# Patient Record
Sex: Male | Born: 1950
Health system: Southern US, Community
[De-identification: ages and names within clinical notes are randomized; demographics above are authoritative.]

## PROBLEM LIST (undated history)

## (undated) DIAGNOSIS — D649 Anemia, unspecified: Secondary | ICD-10-CM

## (undated) DIAGNOSIS — D61818 Other pancytopenia: Secondary | ICD-10-CM

## (undated) DIAGNOSIS — G9340 Encephalopathy, unspecified: Secondary | ICD-10-CM

## (undated) DIAGNOSIS — A419 Sepsis, unspecified organism: Secondary | ICD-10-CM

## (undated) DIAGNOSIS — N390 Urinary tract infection, site not specified: Secondary | ICD-10-CM

## (undated) DIAGNOSIS — Z8619 Personal history of other infectious and parasitic diseases: Secondary | ICD-10-CM

## (undated) DIAGNOSIS — I82401 Acute embolism and thrombosis of unspecified deep veins of right lower extremity: Secondary | ICD-10-CM

## (undated) DIAGNOSIS — R1310 Dysphagia, unspecified: Secondary | ICD-10-CM

## (undated) DIAGNOSIS — I82409 Acute embolism and thrombosis of unspecified deep veins of unspecified lower extremity: Secondary | ICD-10-CM

## (undated) DIAGNOSIS — N289 Disorder of kidney and ureter, unspecified: Secondary | ICD-10-CM

## (undated) DIAGNOSIS — D759 Disease of blood and blood-forming organs, unspecified: Secondary | ICD-10-CM

## (undated) DIAGNOSIS — N400 Enlarged prostate without lower urinary tract symptoms: Secondary | ICD-10-CM

## (undated) DIAGNOSIS — D531 Other megaloblastic anemias, not elsewhere classified: Secondary | ICD-10-CM

## (undated) DIAGNOSIS — I10 Essential (primary) hypertension: Secondary | ICD-10-CM

## (undated) DIAGNOSIS — R627 Adult failure to thrive: Secondary | ICD-10-CM

## (undated) DIAGNOSIS — Z95828 Presence of other vascular implants and grafts: Secondary | ICD-10-CM

## (undated) DIAGNOSIS — G709 Myoneural disorder, unspecified: Secondary | ICD-10-CM

## (undated) DIAGNOSIS — I739 Peripheral vascular disease, unspecified: Secondary | ICD-10-CM

## (undated) DIAGNOSIS — N210 Calculus in bladder: Secondary | ICD-10-CM

## (undated) HISTORY — DX: Acute embolism and thrombosis of unspecified deep veins of unspecified lower extremity: I82.409

---

## 2014-08-13 ENCOUNTER — Emergency Department (HOSPITAL_COMMUNITY): Payer: Medicare Other

## 2014-08-13 ENCOUNTER — Encounter (HOSPITAL_COMMUNITY): Payer: Self-pay | Admitting: Emergency Medicine

## 2014-08-13 ENCOUNTER — Inpatient Hospital Stay (HOSPITAL_COMMUNITY)
Admission: EM | Admit: 2014-08-13 | Discharge: 2014-08-22 | DRG: 673 | Disposition: A | Discharge status: Skilled Nursing Facility | Payer: Medicare Other | Attending: Family Medicine | Admitting: Family Medicine

## 2014-08-13 DIAGNOSIS — Q646 Congenital diverticulum of bladder: Secondary | ICD-10-CM | POA: Diagnosis not present

## 2014-08-13 DIAGNOSIS — M6281 Muscle weakness (generalized): Secondary | ICD-10-CM | POA: Diagnosis not present

## 2014-08-13 DIAGNOSIS — Z720 Tobacco use: Secondary | ICD-10-CM

## 2014-08-13 DIAGNOSIS — E538 Deficiency of other specified B group vitamins: Secondary | ICD-10-CM

## 2014-08-13 DIAGNOSIS — E519 Thiamine deficiency, unspecified: Secondary | ICD-10-CM | POA: Diagnosis present

## 2014-08-13 DIAGNOSIS — D539 Nutritional anemia, unspecified: Secondary | ICD-10-CM | POA: Diagnosis not present

## 2014-08-13 DIAGNOSIS — N320 Bladder-neck obstruction: Secondary | ICD-10-CM | POA: Diagnosis present

## 2014-08-13 DIAGNOSIS — R310 Gross hematuria: Secondary | ICD-10-CM | POA: Diagnosis not present

## 2014-08-13 DIAGNOSIS — D531 Other megaloblastic anemias, not elsewhere classified: Secondary | ICD-10-CM | POA: Diagnosis not present

## 2014-08-13 DIAGNOSIS — D61818 Other pancytopenia: Secondary | ICD-10-CM | POA: Diagnosis not present

## 2014-08-13 DIAGNOSIS — G9389 Other specified disorders of brain: Secondary | ICD-10-CM | POA: Diagnosis not present

## 2014-08-13 DIAGNOSIS — N323 Diverticulum of bladder: Secondary | ICD-10-CM | POA: Diagnosis not present

## 2014-08-13 DIAGNOSIS — I82401 Acute embolism and thrombosis of unspecified deep veins of right lower extremity: Secondary | ICD-10-CM | POA: Diagnosis not present

## 2014-08-13 DIAGNOSIS — E46 Unspecified protein-calorie malnutrition: Secondary | ICD-10-CM | POA: Diagnosis not present

## 2014-08-13 DIAGNOSIS — I82409 Acute embolism and thrombosis of unspecified deep veins of unspecified lower extremity: Secondary | ICD-10-CM

## 2014-08-13 DIAGNOSIS — E440 Moderate protein-calorie malnutrition: Secondary | ICD-10-CM | POA: Diagnosis not present

## 2014-08-13 DIAGNOSIS — Z8042 Family history of malignant neoplasm of prostate: Secondary | ICD-10-CM | POA: Diagnosis not present

## 2014-08-13 DIAGNOSIS — R410 Disorientation, unspecified: Secondary | ICD-10-CM | POA: Diagnosis not present

## 2014-08-13 DIAGNOSIS — Z6824 Body mass index (BMI) 24.0-24.9, adult: Secondary | ICD-10-CM

## 2014-08-13 DIAGNOSIS — N210 Calculus in bladder: Secondary | ICD-10-CM | POA: Diagnosis present

## 2014-08-13 DIAGNOSIS — E876 Hypokalemia: Secondary | ICD-10-CM | POA: Diagnosis present

## 2014-08-13 DIAGNOSIS — E860 Dehydration: Secondary | ICD-10-CM | POA: Diagnosis present

## 2014-08-13 DIAGNOSIS — G9340 Encephalopathy, unspecified: Secondary | ICD-10-CM

## 2014-08-13 DIAGNOSIS — R32 Unspecified urinary incontinence: Secondary | ICD-10-CM | POA: Diagnosis present

## 2014-08-13 DIAGNOSIS — R4182 Altered mental status, unspecified: Secondary | ICD-10-CM | POA: Diagnosis not present

## 2014-08-13 DIAGNOSIS — I8290 Acute embolism and thrombosis of unspecified vein: Secondary | ICD-10-CM | POA: Diagnosis not present

## 2014-08-13 DIAGNOSIS — R319 Hematuria, unspecified: Secondary | ICD-10-CM

## 2014-08-13 DIAGNOSIS — G9349 Other encephalopathy: Secondary | ICD-10-CM | POA: Diagnosis not present

## 2014-08-13 DIAGNOSIS — J90 Pleural effusion, not elsewhere classified: Secondary | ICD-10-CM | POA: Diagnosis not present

## 2014-08-13 DIAGNOSIS — R1084 Generalized abdominal pain: Secondary | ICD-10-CM

## 2014-08-13 DIAGNOSIS — I82411 Acute embolism and thrombosis of right femoral vein: Secondary | ICD-10-CM | POA: Diagnosis not present

## 2014-08-13 DIAGNOSIS — Z049 Encounter for examination and observation for unspecified reason: Secondary | ICD-10-CM | POA: Diagnosis not present

## 2014-08-13 DIAGNOSIS — N390 Urinary tract infection, site not specified: Secondary | ICD-10-CM | POA: Diagnosis not present

## 2014-08-13 DIAGNOSIS — R627 Adult failure to thrive: Secondary | ICD-10-CM | POA: Diagnosis not present

## 2014-08-13 DIAGNOSIS — E43 Unspecified severe protein-calorie malnutrition: Secondary | ICD-10-CM | POA: Diagnosis present

## 2014-08-13 DIAGNOSIS — R531 Weakness: Secondary | ICD-10-CM | POA: Diagnosis not present

## 2014-08-13 DIAGNOSIS — E559 Vitamin D deficiency, unspecified: Secondary | ICD-10-CM

## 2014-08-13 DIAGNOSIS — R41841 Cognitive communication deficit: Secondary | ICD-10-CM | POA: Diagnosis not present

## 2014-08-13 DIAGNOSIS — R079 Chest pain, unspecified: Secondary | ICD-10-CM | POA: Diagnosis not present

## 2014-08-13 DIAGNOSIS — R279 Unspecified lack of coordination: Secondary | ICD-10-CM | POA: Diagnosis not present

## 2014-08-13 LAB — URINE MICROSCOPIC-ADD ON

## 2014-08-13 LAB — TROPONIN I: Troponin I: <0.3 ng/mL (ref <0.30)

## 2014-08-13 LAB — URINALYSIS, ROUTINE W REFLEX MICROSCOPIC
GLUCOSE, UA: NEGATIVE mg/dL
KETONES UR: 40 mg/dL — AB
Nitrite: NEGATIVE
PH: 8.5 — AB (ref 5.0–8.0)
Protein, ur: >300 mg/dL — AB
SPECIFIC GRAVITY, URINE: 1.02 (ref 1.005–1.030)
Urobilinogen, UA: 1 mg/dL (ref 0.0–1.0)

## 2014-08-13 LAB — CBC WITH DIFFERENTIAL/PLATELET
Basophils Absolute: 0 10*3/uL (ref 0.0–0.1)
Basophils Relative: 0 % (ref 0–1)
EOS ABS: 0 10*3/uL (ref 0.0–0.7)
EOS PCT: 1 % (ref 0–5)
HCT: 25.6 % — ABNORMAL LOW (ref 39.0–52.0)
HEMOGLOBIN: 9 g/dL — AB (ref 13.0–17.0)
Lymphocytes Relative: 36 % (ref 12–46)
Lymphs Abs: 1.5 10*3/uL (ref 0.7–4.0)
MCH: 40.4 pg — AB (ref 26.0–34.0)
MCHC: 35.2 g/dL (ref 30.0–36.0)
MCV: 114.8 fL — ABNORMAL HIGH (ref 78.0–100.0)
MONO ABS: 0 10*3/uL — AB (ref 0.1–1.0)
Monocytes Relative: 1 % — ABNORMAL LOW (ref 3–12)
NEUTROS ABS: 2.7 10*3/uL (ref 1.7–7.7)
NEUTROS PCT: 62 % (ref 43–77)
Platelets: 137 10*3/uL — ABNORMAL LOW (ref 150–400)
RBC: 2.23 MIL/uL — AB (ref 4.22–5.81)
RDW: 19.4 % — ABNORMAL HIGH (ref 11.5–15.5)
WBC: 4.2 10*3/uL (ref 4.0–10.5)

## 2014-08-13 LAB — COMPREHENSIVE METABOLIC PANEL
ALT: 7 U/L (ref 0–53)
AST: 14 U/L (ref 0–37)
Albumin: 3.7 g/dL (ref 3.5–5.2)
Alkaline Phosphatase: 49 U/L (ref 39–117)
Anion gap: 15 (ref 5–15)
BUN: 32 mg/dL — ABNORMAL HIGH (ref 6–23)
CHLORIDE: 100 meq/L (ref 96–112)
CO2: 25 meq/L (ref 19–32)
Calcium: 9.4 mg/dL (ref 8.4–10.5)
Creatinine, Ser: 0.92 mg/dL (ref 0.50–1.35)
GFR, EST NON AFRICAN AMERICAN: 87 mL/min — AB (ref >90)
GLUCOSE: 83 mg/dL (ref 70–99)
Potassium: 4 mEq/L (ref 3.7–5.3)
SODIUM: 140 meq/L (ref 137–147)
Total Bilirubin: 2.2 mg/dL — ABNORMAL HIGH (ref 0.3–1.2)
Total Protein: 7.1 g/dL (ref 6.0–8.3)

## 2014-08-13 LAB — PRO B NATRIURETIC PEPTIDE: Pro B Natriuretic peptide (BNP): 82.8 pg/mL (ref 0–125)

## 2014-08-13 MED ORDER — DEXTROSE 5 % IV SOLN
1.0000 g | Freq: Once | INTRAVENOUS | Status: AC
  Administered 2014-08-13: 1 g via INTRAVENOUS
  Filled 2014-08-13: qty 10

## 2014-08-13 MED ORDER — SODIUM CHLORIDE 0.9 % IV BOLUS (SEPSIS): 1000.0000 mL | Freq: Once | INTRAVENOUS | Status: AC

## 2014-08-13 NOTE — ED Notes (Signed)
Brought in by [facility], Son called ems for chest pain, but patient has not c/o any pain at all. Pt found in urine and feces walking around, with possible dementia like actions per EMS.

## 2014-08-13 NOTE — ED Notes (Signed)
Patient given bath. Patient soiled from urine and stool. Clothes placed in patient belonging bags. Family at bedside at this time.

## 2014-08-13 NOTE — ED Provider Notes (Signed)
CSN: [id number]     Arrival date & time 08/13/14  2026 History  This chart was scribed for Geoffery Lyonsouglas Lorayne Getchell, MD by Tonye RoyaltyJoshua Chen, ED Scribe. This patient was seen in room APA05/APA05 and the patient's care was started at 8:48 PM.    Chief Complaint  Patient presents with  . Failure To Thrive   The history is provided by the patient and the EMS personnel. No language interpreter was used.   HPI Comments: Edwin [name] is a 63 y.o. male who presents to the Emergency Department for "nerves." He states he lives at home by himself, though his son visits him approximately once a week. He denies any pain. He denies abdominal pain or diarrhea. He reports incontinence. Per EMS, he called them complaining of chest pain. Upon arrival, he denied any pain and was walking around in clothes soiled with urine and feces with dementia-like actions. They state his son wanted him to come to the ED and be evaluated.  History reviewed. No pertinent past medical history. History reviewed. No pertinent past surgical history. No family history on file. History  Substance Use Topics  . Smoking status: Not on file  . Smokeless tobacco: Current User  . Alcohol Use: No    Review of Systems  All other systems reviewed and are negative. A complete 10 system review of systems was obtained and all systems are negative except as noted in the HPI and PMH.    Allergies  Review of patient's allergies indicates no known allergies.  Home Medications   Prior to Admission medications   Not on File   There were no vitals taken for this visit. Physical Exam  Nursing note and vitals reviewed. Constitutional: He is oriented to person, place, and time. No distress.  Patient is a 63 year old male who appears disheveled and unkempt.   HENT:  Head: Normocephalic and atraumatic.  Eyes: Conjunctivae and EOM are normal.  Neck: Normal range of motion. Neck supple.  Cardiovascular: Normal rate, regular rhythm and normal heart  sounds.   No murmur heard. Pulmonary/Chest: Effort normal and breath sounds normal. No respiratory distress. He has no wheezes. He has no rales.  Abdominal: Soft. Bowel sounds are normal. He exhibits no distension. There is no tenderness. There is no rebound and no guarding.  Musculoskeletal: Normal range of motion.  Neurological: He is alert and oriented to person, place, and time.  Skin: Skin is warm and dry.  There are excoriations in the groin from residual stool and urine.  Psychiatric: He has a normal mood and affect.    ED Course  Procedures (including critical care time) Labs Review Labs Reviewed - No data to display  Imaging Review No results found.   EKG Interpretation   Date/Time:  Monday August 13 2014 21:23:24 EDT Ventricular Rate:  91 PR Interval:  161 QRS Duration: 113 QT Interval:  379 QTC Calculation: 466 R Axis:   23 Text Interpretation:  Sinus rhythm Consider left atrial enlargement  Borderline intraventricular conduction delay Borderline T abnormalities,  lateral leads No prior ecg for comparison Confirmed by DELOS  MD, Bridgette Wolden  ([id number]) on 08/13/2014 11:22:33 PM     DIAGNOSTIC STUDIES: Oxygen Saturation is 99% on room air, normal by my interpretation.    COORDINATION OF CARE: 8:57 PM Discussed treatment plan with patient at beside, the patient agrees with the plan and has no further questions at this time.   MDM   Final diagnoses:  None    Patient  brought for evaluation of progressive weakness and confusion over the past 2 months, worse over the past week. When the son went to check on him today, he was found to be extremely disheveled, covered in his own urine and feces. 911 was called and the patient was brought here. He denies to me he is having any discomfort. He is somewhat difficult to comprehend, however responds appropriately to questions and commands.  Workup reveals essentially unremarkable laboratory studies, negative head CT, and  chest x-ray. He does appear to have a urinary tract infection which will be treated with Rocephin. I have contacted Dr. Alvester MorinNewton from the hospitalist service who will admit the patient.  I personally performed the services described in this documentation, which was scribed in my presence. The recorded information has been reviewed and is accurate.     Geoffery Lyonsouglas Joscelyne Renville, MD 08/16/14 [phone]

## 2014-08-13 NOTE — ED Notes (Addendum)
Pt admitted via EMS from home in clothes saturated with urine and feces. Removed pants and under garments to find patient covered in old dried feces and urine as well as what appeared to be more recent saturation. Groin and scrotum excoriated with open areas, no bleeding noted. Pt stated he had not changed his clothes because "they had not given him time, and he was going to take a bath today sometime." Patient pants and all belongings placed in belongings bag.

## 2014-08-14 ENCOUNTER — Encounter (HOSPITAL_COMMUNITY): Payer: Self-pay | Admitting: *Deleted

## 2014-08-14 ENCOUNTER — Observation Stay (HOSPITAL_COMMUNITY): Payer: Medicare Other

## 2014-08-14 DIAGNOSIS — R079 Chest pain, unspecified: Secondary | ICD-10-CM | POA: Diagnosis not present

## 2014-08-14 DIAGNOSIS — N320 Bladder-neck obstruction: Secondary | ICD-10-CM | POA: Diagnosis present

## 2014-08-14 DIAGNOSIS — E519 Thiamine deficiency, unspecified: Secondary | ICD-10-CM | POA: Diagnosis present

## 2014-08-14 DIAGNOSIS — N323 Diverticulum of bladder: Secondary | ICD-10-CM | POA: Diagnosis not present

## 2014-08-14 DIAGNOSIS — N210 Calculus in bladder: Secondary | ICD-10-CM | POA: Diagnosis not present

## 2014-08-14 DIAGNOSIS — E43 Unspecified severe protein-calorie malnutrition: Secondary | ICD-10-CM | POA: Diagnosis present

## 2014-08-14 DIAGNOSIS — G9349 Other encephalopathy: Secondary | ICD-10-CM | POA: Diagnosis not present

## 2014-08-14 DIAGNOSIS — E538 Deficiency of other specified B group vitamins: Secondary | ICD-10-CM | POA: Diagnosis present

## 2014-08-14 DIAGNOSIS — I82409 Acute embolism and thrombosis of unspecified deep veins of unspecified lower extremity: Secondary | ICD-10-CM | POA: Diagnosis not present

## 2014-08-14 DIAGNOSIS — I82411 Acute embolism and thrombosis of right femoral vein: Secondary | ICD-10-CM | POA: Diagnosis not present

## 2014-08-14 DIAGNOSIS — R410 Disorientation, unspecified: Secondary | ICD-10-CM | POA: Diagnosis not present

## 2014-08-14 DIAGNOSIS — Z8042 Family history of malignant neoplasm of prostate: Secondary | ICD-10-CM | POA: Diagnosis not present

## 2014-08-14 DIAGNOSIS — D61818 Other pancytopenia: Secondary | ICD-10-CM | POA: Diagnosis present

## 2014-08-14 DIAGNOSIS — E440 Moderate protein-calorie malnutrition: Secondary | ICD-10-CM | POA: Diagnosis not present

## 2014-08-14 DIAGNOSIS — G9389 Other specified disorders of brain: Secondary | ICD-10-CM | POA: Diagnosis not present

## 2014-08-14 DIAGNOSIS — R4182 Altered mental status, unspecified: Secondary | ICD-10-CM | POA: Diagnosis not present

## 2014-08-14 DIAGNOSIS — E876 Hypokalemia: Secondary | ICD-10-CM | POA: Diagnosis present

## 2014-08-14 DIAGNOSIS — G9340 Encephalopathy, unspecified: Secondary | ICD-10-CM | POA: Diagnosis not present

## 2014-08-14 DIAGNOSIS — R279 Unspecified lack of coordination: Secondary | ICD-10-CM | POA: Diagnosis not present

## 2014-08-14 DIAGNOSIS — R627 Adult failure to thrive: Secondary | ICD-10-CM | POA: Diagnosis not present

## 2014-08-14 DIAGNOSIS — R32 Unspecified urinary incontinence: Secondary | ICD-10-CM | POA: Diagnosis present

## 2014-08-14 DIAGNOSIS — E46 Unspecified protein-calorie malnutrition: Secondary | ICD-10-CM | POA: Diagnosis not present

## 2014-08-14 DIAGNOSIS — E559 Vitamin D deficiency, unspecified: Secondary | ICD-10-CM | POA: Diagnosis present

## 2014-08-14 DIAGNOSIS — Q646 Congenital diverticulum of bladder: Secondary | ICD-10-CM | POA: Diagnosis not present

## 2014-08-14 DIAGNOSIS — N390 Urinary tract infection, site not specified: Secondary | ICD-10-CM | POA: Diagnosis not present

## 2014-08-14 DIAGNOSIS — E860 Dehydration: Secondary | ICD-10-CM | POA: Diagnosis present

## 2014-08-14 DIAGNOSIS — R41841 Cognitive communication deficit: Secondary | ICD-10-CM | POA: Diagnosis not present

## 2014-08-14 DIAGNOSIS — M6281 Muscle weakness (generalized): Secondary | ICD-10-CM | POA: Diagnosis not present

## 2014-08-14 DIAGNOSIS — I82401 Acute embolism and thrombosis of unspecified deep veins of right lower extremity: Secondary | ICD-10-CM

## 2014-08-14 DIAGNOSIS — D539 Nutritional anemia, unspecified: Secondary | ICD-10-CM | POA: Diagnosis not present

## 2014-08-14 DIAGNOSIS — R310 Gross hematuria: Secondary | ICD-10-CM | POA: Diagnosis not present

## 2014-08-14 DIAGNOSIS — I8290 Acute embolism and thrombosis of unspecified vein: Secondary | ICD-10-CM | POA: Diagnosis not present

## 2014-08-14 DIAGNOSIS — J90 Pleural effusion, not elsewhere classified: Secondary | ICD-10-CM | POA: Diagnosis not present

## 2014-08-14 DIAGNOSIS — R319 Hematuria, unspecified: Secondary | ICD-10-CM | POA: Diagnosis not present

## 2014-08-14 DIAGNOSIS — D531 Other megaloblastic anemias, not elsewhere classified: Secondary | ICD-10-CM | POA: Diagnosis not present

## 2014-08-14 DIAGNOSIS — Z6824 Body mass index (BMI) 24.0-24.9, adult: Secondary | ICD-10-CM | POA: Diagnosis not present

## 2014-08-14 DIAGNOSIS — Z720 Tobacco use: Secondary | ICD-10-CM | POA: Diagnosis not present

## 2014-08-14 DIAGNOSIS — R531 Weakness: Secondary | ICD-10-CM | POA: Diagnosis not present

## 2014-08-14 HISTORY — DX: Other pancytopenia: D61.818

## 2014-08-14 HISTORY — DX: Encephalopathy, unspecified: G93.40

## 2014-08-14 HISTORY — DX: Acute embolism and thrombosis of unspecified deep veins of right lower extremity: I82.401

## 2014-08-14 LAB — HEMOGLOBIN A1C
Hgb A1c MFr Bld: 5.6 % (ref <5.7)
MEAN PLASMA GLUCOSE: 114 mg/dL (ref <117)

## 2014-08-14 LAB — CBC WITH DIFFERENTIAL/PLATELET
BASOS PCT: 0 % (ref 0–1)
Basophils Absolute: 0 10*3/uL (ref 0.0–0.1)
Eosinophils Absolute: 0.1 10*3/uL (ref 0.0–0.7)
Eosinophils Relative: 2 % (ref 0–5)
HCT: 22.5 % — ABNORMAL LOW (ref 39.0–52.0)
Hemoglobin: 7.7 g/dL — ABNORMAL LOW (ref 13.0–17.0)
Lymphocytes Relative: 49 % — ABNORMAL HIGH (ref 12–46)
Lymphs Abs: 1.5 10*3/uL (ref 0.7–4.0)
MCH: 39.5 pg — AB (ref 26.0–34.0)
MCHC: 34.2 g/dL (ref 30.0–36.0)
MCV: 115.4 fL — ABNORMAL HIGH (ref 78.0–100.0)
Monocytes Absolute: 0.1 10*3/uL (ref 0.1–1.0)
Monocytes Relative: 2 % — ABNORMAL LOW (ref 3–12)
NEUTROS PCT: 47 % (ref 43–77)
Neutro Abs: 1.4 10*3/uL — ABNORMAL LOW (ref 1.7–7.7)
PLATELETS: 111 10*3/uL — AB (ref 150–400)
RBC: 1.95 MIL/uL — AB (ref 4.22–5.81)
RDW: 19.3 % — ABNORMAL HIGH (ref 11.5–15.5)
WBC: 3.1 10*3/uL — ABNORMAL LOW (ref 4.0–10.5)

## 2014-08-14 LAB — COMPREHENSIVE METABOLIC PANEL
ALK PHOS: 42 U/L (ref 39–117)
ALT: 6 U/L (ref 0–53)
AST: 13 U/L (ref 0–37)
Albumin: 3.1 g/dL — ABNORMAL LOW (ref 3.5–5.2)
Anion gap: 11 (ref 5–15)
BUN: 29 mg/dL — AB (ref 6–23)
CALCIUM: 8.4 mg/dL (ref 8.4–10.5)
CO2: 26 mEq/L (ref 19–32)
Chloride: 103 mEq/L (ref 96–112)
Creatinine, Ser: 0.86 mg/dL (ref 0.50–1.35)
GFR, EST NON AFRICAN AMERICAN: 89 mL/min — AB (ref >90)
Glucose, Bld: 80 mg/dL (ref 70–99)
Potassium: 4.1 mEq/L (ref 3.7–5.3)
SODIUM: 140 meq/L (ref 137–147)
Total Bilirubin: 1.7 mg/dL — ABNORMAL HIGH (ref 0.3–1.2)
Total Protein: 6 g/dL (ref 6.0–8.3)

## 2014-08-14 LAB — LIPID PANEL
Cholesterol: 111 mg/dL (ref 0–200)
HDL: 18 mg/dL — AB (ref >39)
LDL CALC: 72 mg/dL (ref 0–99)
TRIGLYCERIDES: 104 mg/dL (ref <150)
Total CHOL/HDL Ratio: 6.2 RATIO
VLDL: 21 mg/dL (ref 0–40)

## 2014-08-14 LAB — IRON AND TIBC
IRON: 50 ug/dL (ref 42–135)
Saturation Ratios: 27 % (ref 20–55)
TIBC: 187 ug/dL — ABNORMAL LOW (ref 215–435)
UIBC: 137 ug/dL (ref 125–400)

## 2014-08-14 LAB — FERRITIN: FERRITIN: 254 ng/mL (ref 22–322)

## 2014-08-14 LAB — TSH: TSH: 0.802 u[IU]/mL (ref 0.350–4.500)

## 2014-08-14 LAB — RAPID URINE DRUG SCREEN, HOSP PERFORMED
AMPHETAMINES: NOT DETECTED
Barbiturates: NOT DETECTED
Benzodiazepines: NOT DETECTED
COCAINE: NOT DETECTED
OPIATES: NOT DETECTED
Tetrahydrocannabinol: NOT DETECTED

## 2014-08-14 LAB — TROPONIN I
Troponin I: <0.3 ng/mL (ref <0.30)
Troponin I: <0.3 ng/mL (ref <0.30)

## 2014-08-14 LAB — RETICULOCYTES
RBC.: 2.02 MIL/uL — ABNORMAL LOW (ref 4.22–5.81)
Retic Count, Absolute: 12.1 10*3/uL — ABNORMAL LOW (ref 19.0–186.0)
Retic Ct Pct: 0.6 % (ref 0.4–3.1)

## 2014-08-14 LAB — CBC
HEMATOCRIT: 25.7 % — AB (ref 39.0–52.0)
HEMOGLOBIN: 9 g/dL — AB (ref 13.0–17.0)
MCH: 40.7 pg — ABNORMAL HIGH (ref 26.0–34.0)
MCHC: 35 g/dL (ref 30.0–36.0)
MCV: 116.3 fL — ABNORMAL HIGH (ref 78.0–100.0)
Platelets: 123 10*3/uL — ABNORMAL LOW (ref 150–400)
RBC: 2.21 MIL/uL — ABNORMAL LOW (ref 4.22–5.81)
RDW: 19.2 % — ABNORMAL HIGH (ref 11.5–15.5)
WBC: 3.4 10*3/uL — ABNORMAL LOW (ref 4.0–10.5)

## 2014-08-14 LAB — FOLATE: Folate: >20 ng/mL

## 2014-08-14 LAB — VITAMIN B12: Vitamin B-12: 88 pg/mL — ABNORMAL LOW (ref 211–911)

## 2014-08-14 LAB — AMMONIA: Ammonia: 21 umol/L (ref 11–60)

## 2014-08-14 LAB — PROTIME-INR
INR: 1.22 (ref 0.00–1.49)
PROTHROMBIN TIME: 15.5 s — AB (ref 11.6–15.2)

## 2014-08-14 LAB — PREALBUMIN: Prealbumin: 7 mg/dL — ABNORMAL LOW (ref 17.0–34.0)

## 2014-08-14 MED ORDER — SODIUM CHLORIDE 0.9 % IV SOLN
INTRAVENOUS | Status: DC
  Administered 2014-08-14 – 2014-08-22 (×11): via INTRAVENOUS

## 2014-08-14 MED ORDER — VANCOMYCIN HCL IN DEXTROSE 750-5 MG/150ML-% IV SOLN
750.0000 mg | Freq: Once | INTRAVENOUS | Status: AC
Start: 2014-08-14 — End: 2014-08-14
  Administered 2014-08-14: 750 mg via INTRAVENOUS
  Filled 2014-08-14: qty 150

## 2014-08-14 MED ORDER — PANTOPRAZOLE SODIUM 40 MG PO TBEC
40.0000 mg | DELAYED_RELEASE_TABLET | Freq: Every day | ORAL | Status: DC
  Administered 2014-08-14 – 2014-08-22 (×9): 40 mg via ORAL
  Filled 2014-08-14 (×10): qty 1

## 2014-08-14 MED ORDER — VANCOMYCIN HCL IN DEXTROSE 750-5 MG/150ML-% IV SOLN
INTRAVENOUS | Status: AC
  Filled 2014-08-14: qty 150

## 2014-08-14 MED ORDER — PIPERACILLIN-TAZOBACTAM 3.375 G IVPB
INTRAVENOUS | Status: AC
  Filled 2014-08-14: qty 50

## 2014-08-14 MED ORDER — ENOXAPARIN SODIUM 80 MG/0.8ML ~~LOC~~ SOLN
1.0000 mg/kg | Freq: Two times a day (BID) | SUBCUTANEOUS | Status: DC
  Administered 2014-08-14 – 2014-08-15 (×2): 70 mg via SUBCUTANEOUS
  Filled 2014-08-14 (×2): qty 0.8

## 2014-08-14 MED ORDER — THIAMINE HCL 100 MG/ML IJ SOLN
100.0000 mg | Freq: Every day | INTRAMUSCULAR | Status: DC
  Administered 2014-08-14 – 2014-08-21 (×8): 100 mg via INTRAVENOUS
  Filled 2014-08-14 (×9): qty 2

## 2014-08-14 MED ORDER — HEPARIN SODIUM (PORCINE) 5000 UNIT/ML IJ SOLN
5000.0000 [IU] | Freq: Three times a day (TID) | INTRAMUSCULAR | Status: DC
Start: 2014-08-14 — End: 2014-08-14
  Administered 2014-08-14 (×3): 5000 [IU] via SUBCUTANEOUS
  Filled 2014-08-14 (×3): qty 1

## 2014-08-14 MED ORDER — CYANOCOBALAMIN 1000 MCG/ML IJ SOLN
1000.0000 ug | Freq: Every day | INTRAMUSCULAR | Status: DC
  Administered 2014-08-14 – 2014-08-15 (×2): 1000 ug via INTRAMUSCULAR
  Filled 2014-08-14 (×2): qty 1

## 2014-08-14 MED ORDER — VANCOMYCIN HCL IN DEXTROSE 750-5 MG/150ML-% IV SOLN
750.0000 mg | Freq: Three times a day (TID) | INTRAVENOUS | Status: DC
  Administered 2014-08-14 – 2014-08-15 (×4): 750 mg via INTRAVENOUS
  Filled 2014-08-14 (×7): qty 150

## 2014-08-14 MED ORDER — FOLIC ACID 1 MG PO TABS
1.0000 mg | ORAL_TABLET | Freq: Every day | ORAL | Status: DC
  Administered 2014-08-14 – 2014-08-22 (×9): 1 mg via ORAL
  Filled 2014-08-14 (×11): qty 1

## 2014-08-14 MED ORDER — ENSURE COMPLETE PO LIQD
237.0000 mL | Freq: Two times a day (BID) | ORAL | Status: DC
  Administered 2014-08-14 (×2): via ORAL
  Administered 2014-08-15 – 2014-08-22 (×12): 237 mL via ORAL

## 2014-08-14 MED ORDER — PIPERACILLIN-TAZOBACTAM 3.375 G IVPB
3.3750 g | Freq: Three times a day (TID) | INTRAVENOUS | Status: DC
  Administered 2014-08-14 – 2014-08-15 (×5): 3.375 g via INTRAVENOUS
  Filled 2014-08-14 (×7): qty 50

## 2014-08-14 MED ORDER — PIPERACILLIN-TAZOBACTAM 3.375 G IVPB
3.3750 g | Freq: Once | INTRAVENOUS | Status: AC
  Administered 2014-08-14: 3.375 g via INTRAVENOUS
  Filled 2014-08-14: qty 50

## 2014-08-14 NOTE — Progress Notes (Addendum)
ADDENDUM:  -Right lower extremity venous Doppler revealed nonocclusive thrombus extending from the right common femoral vein to the distal aspect of the right superficial femoral vein. Although the patient appears to be at risk of falls and because he is pancytopenic-not profoundly so-would favor starting Lovenox without Coumadin. We'll order a VQ scan (rather than CT given all of the recent radiation from radiographic studies so far) to rule out PE. [facility] for recommendations for treating this particular patient long-term treatment for nonocclusive right lower extremity DVT.  -Also, the patient's vitamin B12 level was found to be significantly low at 88. We'll start vitamin B12 injections IM daily x3 days and then monthly thereafter.  -The patient's son Jaye BeagleCalvin Brown was updated.    The patient is a 63 year old man with no recent physician visits per history, who was admitted this morning by Dr. Alvester MorinNewton for generalized weakness, confusion, weight loss, and failing to thrive at home. The patient was briefly seen and examined. His chart, vital signs, and laboratory studies were reviewed. Agree with antibiotic therapy and current plan with additions below:  -Followup on urine and blood cultures-consider narrowing antibiotics tomorrow. -We'll decrease IV fluids -Followup on laboratory studies ordered. Also check RPR.  -We'll check blood HSV PCR. Although MRI is abnormal, the patient clinically does not appear to have encephalitis. We'll consult neurology for recommendations. -We'll get a bedside swallow evaluation and if he passes, we'll start a dysphagia 2 diet (patient has multiple missing teeth) -Will check another CBC this afternoon. Monitor closely to evaluate for need for packed red blood cell transfusion. Check Hemoccult stools pending. -We'll start Protonix empirically. -Right lower extremity venous Doppler to rule out DVT for right leg edema. -PT  evaluation. -Discuss with family when available.

## 2014-08-14 NOTE — Progress Notes (Signed)
CRITICAL VALUE ALERT  Critical value received:  Positive for non-occlusive Thrombus extending from level of right common femoral vein to distal aspect of right superficial femoral vein.  Incidental note made, approximately 2.8 cm right sided Baker's cyst.    Date of notification:  08/14/2014   Time of notification:  17:23  Critical value read back:Yes.    Nurse who received alert:  Cyndia DiverLindsi Alpa Salvo, RN  MD notified (1st page):  Fisher  Time of first page:  17:25  MD notified (2nd page):  Time of second page:  Responding MD:  Sherrie MustacheFisher  Time MD responded:  17:45

## 2014-08-14 NOTE — Progress Notes (Signed)
UR completed 

## 2014-08-14 NOTE — Evaluation (Signed)
Physical Therapy Evaluation Patient Details Name: Edwin [name] MRN: [id number] DOB: 10/29/1950 Today's Date: 08/14/2014   History of Present Illness  This is a 63 y.o. year old male with no prior medical care  presenting with encephalopathy and failure to thrive. Family states that pt lives alone. He has never seen a doctor per pt and family. Family states that pt has had progressive weakness, decreased appetite, confusion over past 2 months. No known prior history of medical problems. Pt denies any ETOH or tobacco abuse. Does report progressive weight loss and decreased appetite over extended period of time. Per report, pt was found covered in his own fecal material and urine  at home today by EMS  Clinical Impression  Pt is a 63 year old male who presents to PT for assessment of functional mobility skills.  Pt able to state name, DOB (after increased time to answer), and location, though unable to state month or year (states [name] is president).  Pt able to transfer to EOB with mod (I), though required use of RW and min guard/min assist for transfers and functional mobility skills due to instability.  Pt had to use rest room during gait assessment and stopped all mobility and crossed legs refusing/unable to amb to bathroom or back to bed.  Pt required assist of RN to assist with toileting, and PT to assist with balance during evaluation.  Recommend continued PT while in the hospital to address strengthening, activity tolerance, balance for improved functional mobility skills.  Unsure of recommendation for d/c as no family present to discuss and pt appears confused when PT spoke of d/c.  Pt will require 24/7 supervision for OOB/mobility activities for safety due to cognition/confusion and balance deficits.  Pt may benefit for consultation for ALF placement.  Pt will require use of RW for safety with gait.     Follow Up Recommendations Supervision for mobility/OOB;Other (comment) (Recommend ALF placement  with HHPT, or return home with supervision for all OOB activities with HHPT)    Equipment Recommendations  Rolling walker with 5" wheels       Precautions / Restrictions Precautions Precautions: Fall Restrictions Weight Bearing Restrictions: No      Mobility  Bed Mobility Overal bed mobility: Modified Independent                Transfers Overall transfer level: Needs assistance Equipment used: Rolling walker (2 wheeled) Transfers: Sit to/from Stand Sit to Stand: Min guard;Min assist            Ambulation/Gait Ambulation/Gait assistance: Min guard;Min assist Ambulation Distance (Feet): 10 Feet Assistive device: Rolling walker (2 wheeled) Gait Pattern/deviations: Step-through pattern;Decreased dorsiflexion - right;Decreased dorsiflexion - left   Gait velocity interpretation: Below normal speed for age/gender General Gait Details: Fair standing balance requiring use of RW and min assist to maintain balance during gait.         Balance Overall balance assessment: Needs assistance Sitting-balance support: Feet supported;No upper extremity supported Sitting balance-Leahy Scale: Good     Standing balance support: Bilateral upper extremity supported;During functional activity Standing balance-Leahy Scale: Fair                               Pertinent Vitals/Pain Pain Assessment: No/denies pain    Home Living Family/patient expects to be discharged to:: Unsure Living Arrangements: Alone Available Help at Discharge: Family;Available PRN/intermittently (Son lives nearby and checks on pt daily) Type of Home: House Home  Access: Stairs to enter   Enter[employer] CorporationEntrance Stairs-Number of Steps: 1 Home Layout: One level Home Equipment: Cane - single point Additional Comments: Tub Shower    Prior Function Level of Independence: Independent with assistive device(s);Needs assistance   Gait / Transfers Assistance Needed: Per pt, he is mod (I) with bed mobility  skills, transfers, and household amb with std cane in the Rt hand  ADL's / Homemaking Assistance Needed: Per pt, he requires assist with dressing and bathing.  His son completes all his driving, shopping tasks.  Pt reports he is able to microwave food and feed himself.         Hand Dominance   Dominant Hand: Right    Extremity/Trunk Assessment               Lower Extremity Assessment: Generalized weakness         Communication   Communication: No difficulties  Cognition Arousal/Alertness: Awake/alert Behavior During Therapy: WFL for tasks assessed/performed Overall Cognitive Status: No family/caregiver present to determine baseline cognitive functioning (Pt able to give name, DOB, location, though unable to state month/year (stated Clinton was president))                       Assessment/Plan    PT Assessment Patient needs continued PT services  PT Diagnosis Abnormality of gait;Generalized weakness   PT Problem List Decreased strength;Decreased activity tolerance;Decreased balance;Decreased mobility  PT Treatment Interventions Gait training;Balance training;Stair training;Therapeutic activities;Therapeutic exercise;Functional mobility training   PT Goals (Current goals can be found in the Care Plan section) Acute Rehab PT Goals Patient Stated Goal: none stated    Frequency Min 3X/week   Barriers to discharge Decreased caregiver support         End of Session Equipment Utilized During Treatment: Gait belt Activity Tolerance: Patient limited by fatigue Patient left: in bed;with call bell/phone within reach;with bed alarm set (RN in room) Nurse Communication: Mobility status    Functional Assessment Tool Used: Clinical Judgement Functional Limitation: Mobility: Walking and moving around Mobility: Walking and Moving Around Current Status ([id number]([id number]): At least 20 percent but less than 40 percent impaired, limited or restricted Mobility: Walking and Moving  Around Goal Status [phone](G8979): At least 1 percent but less than 20 percent impaired, limited or restricted    Time: [id number] PT Time Calculation (min): 20 min   Charges:   PT Evaluation $Initial PT Evaluation Tier I: 1 Procedure     PT G Codes:   Functional Assessment Tool Used: Clinical Judgement Functional Limitation: Mobility: Walking and moving around    [facility] St [facility] HospitalWOODWORTH,Neli Fofana 08/14/2014, 12:34 PM

## 2014-08-14 NOTE — H&P (Signed)
Hospitalist Admission History and Physical  Patient name: Edwin [name] Medical record number: [id number] Date of birth: 08/01/1951 Age: 63 y.o. Gender: male  Primary Care Provider: No primary provider on file.  Chief Complaint: encephalopathy, failure to thrive, UTI    History of Present Illness:This is a 63 y.o. year old male with no prior medical care  presenting with encephalopathy and failure to thrive. Family states that pt lives alone. He has never seen a doctor per pt and family. Family states that pt has had progressive weakness, decreased appetite, confusion over past 2 months. No known prior history of medical problems. Pt denies any ETOH or tobacco abuse. Does report progressive weight loss and decreased appetite over extended period of time. Per report, pt was found covered in his own fecal material and urine  at home today by EMS.  On presentation to ER, Tmax 99, HR 80s-80s, resp 10s, BP 110s-130s, Satting upper 80s-100% on RA. WBC 4.2, Hgb 9, Cr 0.92, Trop negx 1. Head CT WNL. CXR WNL. UA indicative of infection.  Assessment and Plan: Edwin [name] is a 63 y.o. year old male presenting with encephalopathy, failure to thrive, UTI    Active Problems:   Encephalopathy   Adult failure to thrive   UTI (lower urinary tract infection)   1- Encephalopathy -Includes toxic metabolic, infectious etiologies among others  -treat UTI -panculture -check ammonia, prealbumin, TSH, anemia panel  -MRI brain  -hydrate  -reassess in am   2- Failure to thrive  -Pt/family deny ANY medical care for this pt in the past  -ddx includes metabolic, neurogenic, nutritional, metastatic etiologies  -multiple blood studies including MRI, vitamin D, B12, folate, prealbumin -check PSA -check CT chest abd, pelvis to assess for occult malignancy  -hemoccult pt given anemia   3-UTI  -s/p rocephin x1 in ER -urine culture -panculture and broad spectrum abx given subclinical temp on presentation and pt  being found in his own excriment.  -follow   4- Anemia  -hemoccult  -anemia panel    FEN/GI: heart healthy diet pending bedside swallow eval  Prophylaxis: sub q heparin  Disposition: pending further evaluatin  Code Status:Full Code    Patient Active Problem List   Diagnosis Date Noted  . Encephalopathy 08/14/2014  . Adult failure to thrive 08/14/2014   Past Medical History: History reviewed. No pertinent past medical history.  Past Surgical History: History reviewed. No pertinent past surgical history.  Social History: History   Social History  . Marital Status: Single    Spouse Name: N/A    Number of Children: N/A  . Years of Education: N/A   Social History Main Topics  . Smoking status: None  . Smokeless tobacco: Current User  . Alcohol Use: No  . Drug Use: None  . Sexual Activity: None   Other Topics Concern  . None   Social History Narrative  . None    Family History: No family history on file.  Allergies: No Known Allergies  Current Facility-Administered Medications  Medication Dose Route Frequency Provider Last Rate Last Dose  . 0.9 %  sodium chloride infusion   Intravenous Continuous Doree AlbeeSteven Ofelia Podolski, MD      . cefTRIAXone (ROCEPHIN) 1 g in dextrose 5 % 50 mL IVPB  1 g Intravenous Once Geoffery Lyonsouglas Delo, MD 100 mL/hr at 08/13/14 2352 1 g at 08/13/14 2352  . heparin injection 5,000 Units  5,000 Units Subcutaneous 3 times per day Doree AlbeeSteven Maebel Marasco, MD       No  current outpatient prescriptions on file.   Review Of Systems: 12 point ROS negative except as noted above in HPI.  Physical Exam: Filed Vitals:   08/13/14 2308  BP:   Pulse: 82  Temp:   Resp: 11    General: cachectic, markedly disshevled  HEENT: poor dentition Heart: S1, S2 normal, no murmur, rub or gallop, regular rate and rhythm Lungs: clear to auscultation, no wheezes or rales and unlabored breathing Abdomen: + bowel sounds, mild abd TTP diffusely  Extremities: trace edema in LE  bilaterally, neurovascularly intact-no popliteal tenderness Skin:no ecchymoses Neurology: normal without focal findings  Labs and Imaging: Lab Results  Component Value Date/Time   NA 140 08/13/2014  9:26 PM   K 4.0 08/13/2014  9:26 PM   CL 100 08/13/2014  9:26 PM   CO2 25 08/13/2014  9:26 PM   BUN 32* 08/13/2014  9:26 PM   CREATININE 0.92 08/13/2014  9:26 PM   GLUCOSE 83 08/13/2014  9:26 PM   Lab Results  Component Value Date   WBC 4.2 08/13/2014   HGB 9.0* 08/13/2014   HCT 25.6* 08/13/2014   MCV 114.8* 08/13/2014   PLT 137* 08/13/2014   Urinalysis    Component Value Date/Time   COLORURINE AMBER* 08/13/2014 2302   APPEARANCEUR CLOUDY* 08/13/2014 2302   LABSPEC 1.020 08/13/2014 2302   PHURINE 8.5* 08/13/2014 2302   GLUCOSEU NEGATIVE 08/13/2014 2302   HGBUR LARGE* 08/13/2014 2302   BILIRUBINUR MODERATE* 08/13/2014 2302   KETONESUR 40* 08/13/2014 2302   PROTEINUR >300* 08/13/2014 2302   UROBILINOGEN 1.0 08/13/2014 2302   NITRITE NEGATIVE 08/13/2014 2302   LEUKOCYTESUR MODERATE* 08/13/2014 2302      Dg Chest 2 View  08/13/2014   CLINICAL DATA:  Failure to thrive. A initial complaint to EMS was chest pain. Patient has altered mental status.  EXAM: CHEST  2 VIEW  COMPARISON:  None.  FINDINGS: Cardiac silhouette is normal in size. Normal mediastinal and hilar contours.  Clear lungs.  No pleural effusion or pneumothorax.  Bony thorax is intact.  IMPRESSION: No active cardiopulmonary disease.   Electronically Signed   By: Amie Portlandavid  Ormond M.D.   On: 08/13/2014 22:56   Ct Head Wo Contrast  08/13/2014   CLINICAL DATA:  63 year old male found with altered mental status, dementia like activity. Initial encounter.  EXAM: CT HEAD WITHOUT CONTRAST  TECHNIQUE: Contiguous axial images were obtained from the base of the skull through the vertex without intravenous contrast.  COMPARISON:  None.  FINDINGS: Mild paranasal sinus mucosal thickening. Mastoids are clear. No acute osseous  abnormality identified. Visualized orbits and scalp soft tissues are within normal limits.  Cerebral volume is within normal limits for age. No suspicious intracranial vascular hyperdensity. No midline shift, ventriculomegaly, mass effect, evidence of mass lesion, intracranial hemorrhage or evidence of cortically based acute infarction. Gray-white matter differentiation is within normal limits throughout the brain.  IMPRESSION: Normal for age non contrast CT appearance of the brain.   Electronically Signed   By: Augusto GambleLee  Hall M.D.   On: 08/13/2014 23:03           Doree AlbeeSteven Roslynn Holte MD  Pager: [phone]

## 2014-08-14 NOTE — Progress Notes (Signed)
ANTICOAGULATION CONSULT NOTE - Initial Consult  Pharmacy Consult for Lovenox Indication: nonocclusive right lower extremity DVT.  No Known Allergies  Patient Measurements: Height: 5\' 7"  (170.2 cm) (5'7') Weight: 155 lb 10.3 oz (70.6 kg) IBW/kg (Calculated) : 66.1  Vital Signs: Temp: 97.9 F (36.6 C) (10/20 1400) BP: 122/82 mmHg (10/20 1400) Pulse Rate: 72 (10/20 1400)  Labs:  Recent Labs  08/13/14 2126 08/14/14 0041 08/14/14 0559 08/14/14 1204 08/14/14 1531  HGB 9.0*  --  7.7*  --  9.0*  HCT 25.6*  --  22.5*  --  25.7*  PLT 137*  --  111*  --  123*  CREATININE 0.92  --  0.86  --   --   TROPONINI <0.30 <0.30 <0.30 <0.30  --    Estimated Creatinine Clearance: 80.1 ml/min (by C-G formula based on Cr of 0.86).  Medical History: History reviewed. No pertinent past medical history.  Medications:  Scheduled:  . cyanocobalamin  1,000 mcg Intramuscular Daily  . feeding supplement (ENSURE COMPLETE)  237 mL Oral BID BM  . pantoprazole  40 mg Oral Daily  . piperacillin-tazobactam (ZOSYN)  IV  3.375 g Intravenous Q8H  . thiamine  100 mg Intravenous Daily  . vancomycin  750 mg Intravenous Q8H    Assessment: Okay for Protocol, pancytopenic-not profoundly so.  No prior medical care.  Heme/Onc consult pending for long term anticoag recommendations.  SQ Heparin has been stopped.  Goal of Therapy:  Anti-Xa level 0.6-1 units/ml 4hrs after LMWH dose given if clinically indicated to monitor. Systemic Anticoagulation. Monitor platelets by anticoagulation protocol: Yes   Plan:  Lovenox 1mg /kg SQ every 12 hours. CBC daily. Baseline PT/INR pending.  Edwin [name], Edwin [name] 08/14/2014,6:27 PM

## 2014-08-14 NOTE — Consult Note (Signed)
[place] A. Merlene Laughter, MD     [email or web address]          Edwin [name] is an 63 y.o. male.   ASSESSMENT/PLAN: 1. Multifactorial progressive encephalopathy. Etiologies includes severe nutritional deficiency, dehydration near tract infection. The patient appropriately has been placed on antibiotics and is being replaced with vitamin B12 and vitamin B1. Folic acid was be added. An EEG will be obtained.  2. Questionable abnormal MRI findings. If the findings are real, I suspect that vitamin B1 deficiency is the most likely etiology. Other etiologies seem unlikely in this clinical situation.  The patient is a 63 year old biracial man who presents with a two-month history of worsening appetite, decreased function and increased confusion. The patient was brought to the hospital for further evaluation of these symptoms. He was found to be in poor health and poor nutritional status. Workup has been significant for significant UTI, dehydration and a vitamin B12 deficiency. The patient has been started on appropriate treatment. He does not report having complaints at this time. He denies headaches, chest pain, focal numbness or weakness or other symptoms at this time.  GENERAL: He appeared malnourished and unkept.  HEENT: Supple. Atraumatic normocephalic.   ABDOMEN: soft  EXTREMITIES: No edema   BACK: Normal.  SKIN: Normal by inspection.    MENTAL STATUS: He is awake and alert. He knows that he is in the hospital in [place]. He thinks his 63. He does follow commands briskly. No dysarthria is observed. Comprehension seems good.  CRANIAL NERVES: Pupils are equal, round and reactive to light and accommodation; extra ocular movements are full, there is no significant nystagmus; visual fields are full; upper and lower facial muscles are normal in strength and symmetric, there is no flattening of the nasolabial folds; tongue is midline; uvula is midline; shoulder elevation is  normal.  MOTOR: Normal tone, bulk and strength throughout.  COORDINATION: Left finger to nose is normal, right finger to nose is normal, No rest tremor; no intention tremor; no postural tremor; no bradykinesia.  REFLEXES: Deep tendon reflexes are symmetrical and normal. Babinski reflexes are flexor bilaterally.   SENSATION: Normal to light touch.    Blood pressure 122/82, pulse 72, temperature 97.9 F (36.6 C), temperature source Oral, resp. rate 20, height 5' 7"  (1.702 m), weight 70.6 kg (155 lb 10.3 oz), SpO2 100.00%.  History reviewed. No pertinent past medical history.  History reviewed. No pertinent past surgical history.  No family history on file.  Social History:  reports that he has never smoked. He uses smokeless tobacco. He reports that he does not drink alcohol. His drug history is not on file.  Allergies: No Known Allergies  Medications: Prior to Admission medications   Not on File    Scheduled Meds: . cyanocobalamin  1,000 mcg Intramuscular Daily  . enoxaparin (LOVENOX) injection  1 mg/kg Subcutaneous Q12H  . feeding supplement (ENSURE COMPLETE)  237 mL Oral BID BM  . pantoprazole  40 mg Oral Daily  . piperacillin-tazobactam (ZOSYN)  IV  3.375 g Intravenous Q8H  . thiamine  100 mg Intravenous Daily  . vancomycin  750 mg Intravenous Q8H   Continuous Infusions: . sodium chloride 70 mL/hr at 08/14/14 1217   PRN Meds:.     Results for orders placed during the hospital encounter of 08/13/14 (from the past 48 hour(s))  CBC WITH DIFFERENTIAL     Status: Abnormal   Collection Time    08/13/14  9:26 PM      Result  Value Ref Range   WBC 4.2  4.0 - 10.5 K/uL   RBC 2.23 (*) 4.22 - 5.81 MIL/uL   Hemoglobin 9.0 (*) 13.0 - 17.0 g/dL   HCT 25.6 (*) 39.0 - 52.0 %   MCV 114.8 (*) 78.0 - 100.0 fL   MCH 40.4 (*) 26.0 - 34.0 pg   MCHC 35.2  30.0 - 36.0 g/dL   RDW 19.4 (*) 11.5 - 15.5 %   Platelets 137 (*) 150 - 400 K/uL   Neutrophils Relative % 62  43 - 77 %    Lymphocytes Relative 36  12 - 46 %   Monocytes Relative 1 (*) 3 - 12 %   Eosinophils Relative 1  0 - 5 %   Basophils Relative 0  0 - 1 %   Neutro Abs 2.7  1.7 - 7.7 K/uL   Lymphs Abs 1.5  0.7 - 4.0 K/uL   Monocytes Absolute 0.0 (*) 0.1 - 1.0 K/uL   Eosinophils Absolute 0.0  0.0 - 0.7 K/uL   Basophils Absolute 0.0  0.0 - 0.1 K/uL   RBC Morphology RARE NRBCs     Comment: ELLIPTOCYTES     TEARDROP CELLS   WBC Morphology TOXIC GRANULATION    COMPREHENSIVE METABOLIC PANEL     Status: Abnormal   Collection Time    08/13/14  9:26 PM      Result Value Ref Range   Sodium 140  137 - 147 mEq/L   Potassium 4.0  3.7 - 5.3 mEq/L   Chloride 100  96 - 112 mEq/L   CO2 25  19 - 32 mEq/L   Glucose, Bld 83  70 - 99 mg/dL   BUN 32 (*) 6 - 23 mg/dL   Creatinine, Ser 0.92  0.50 - 1.35 mg/dL   Calcium 9.4  8.4 - 10.5 mg/dL   Total Protein 7.1  6.0 - 8.3 g/dL   Albumin 3.7  3.5 - 5.2 g/dL   AST 14  0 - 37 U/L   ALT 7  0 - 53 U/L   Alkaline Phosphatase 49  39 - 117 U/L   Total Bilirubin 2.2 (*) 0.3 - 1.2 mg/dL   GFR calc non Af Amer 87 (*) >90 mL/min   GFR calc Af Amer >90  >90 mL/min   Comment: (NOTE)     The eGFR has been calculated using the CKD EPI equation.     This calculation has not been validated in all clinical situations.     eGFR's persistently <90 mL/min signify possible Chronic Kidney     Disease.   Anion gap 15  5 - 15  TROPONIN I     Status: None   Collection Time    08/13/14  9:26 PM      Result Value Ref Range   Troponin I <0.30  <0.30 ng/mL   Comment:            Due to the release kinetics of cTnI,     a negative result within the first hours     of the onset of symptoms does not rule out     myocardial infarction with certainty.     If myocardial infarction is still suspected,     repeat the test at appropriate intervals.  PRO B NATRIURETIC PEPTIDE     Status: None   Collection Time    08/13/14 10:24 PM      Result Value Ref Range   Pro B Natriuretic peptide (BNP)  82.8  0 - 125 pg/mL  URINALYSIS, ROUTINE W REFLEX MICROSCOPIC     Status: Abnormal   Collection Time    08/13/14 11:02 PM      Result Value Ref Range   Color, Urine AMBER (*) YELLOW   Comment: BIOCHEMICALS MAY BE AFFECTED BY COLOR   APPearance CLOUDY (*) CLEAR   Specific Gravity, Urine 1.020  1.005 - 1.030   pH 8.5 (*) 5.0 - 8.0   Glucose, UA NEGATIVE  NEGATIVE mg/dL   Hgb urine dipstick LARGE (*) NEGATIVE   Bilirubin Urine MODERATE (*) NEGATIVE   Ketones, ur 40 (*) NEGATIVE mg/dL   Protein, ur >300 (*) NEGATIVE mg/dL   Urobilinogen, UA 1.0  0.0 - 1.0 mg/dL   Nitrite NEGATIVE  NEGATIVE   Leukocytes, UA MODERATE (*) NEGATIVE  URINE MICROSCOPIC-ADD ON     Status: Abnormal   Collection Time    08/13/14 11:02 PM      Result Value Ref Range   Squamous Epithelial / LPF RARE  RARE   WBC, UA 21-50  <3 WBC/hpf   RBC / HPF 11-20  <3 RBC/hpf   Bacteria, UA MANY (*) RARE   Crystals TRIPLE PHOSPHATE CRYSTALS (*) NEGATIVE  URINE RAPID DRUG SCREEN (HOSP PERFORMED)     Status: None   Collection Time    08/13/14 11:02 PM      Result Value Ref Range   Opiates NONE DETECTED  NONE DETECTED   Cocaine NONE DETECTED  NONE DETECTED   Benzodiazepines NONE DETECTED  NONE DETECTED   Amphetamines NONE DETECTED  NONE DETECTED   Tetrahydrocannabinol NONE DETECTED  NONE DETECTED   Barbiturates NONE DETECTED  NONE DETECTED   Comment:            DRUG SCREEN FOR MEDICAL PURPOSES     ONLY.  IF CONFIRMATION IS NEEDED     FOR ANY PURPOSE, NOTIFY LAB     WITHIN 5 DAYS.                LOWEST DETECTABLE LIMITS     FOR URINE DRUG SCREEN     Drug Class       Cutoff (ng/mL)     Amphetamine      1000     Barbiturate      200     Benzodiazepine   093     Tricyclics       267     Opiates          300     Cocaine          300     THC              50  TSH     Status: None   Collection Time    08/14/14 12:06 AM      Result Value Ref Range   TSH 0.802  0.350 - 4.500 uIU/mL   Comment: Performed at [facility]     Status: None   Collection Time    08/14/14 12:08 AM      Result Value Ref Range   Ammonia 21  11 - 60 umol/L  CULTURE, BLOOD (ROUTINE X 2)     Status: None   Collection Time    08/14/14 12:13 AM      Result Value Ref Range   Specimen Description BLOOD LEFT ANTECUBITAL     Special Requests       Value: BOTTLES DRAWN AEROBIC  AND ANAEROBIC AEB 2CC ANA 6CC   Culture NO GROWTH <24 HRS     Report Status PENDING    CULTURE, BLOOD (ROUTINE X 2)     Status: None   Collection Time    08/14/14 12:13 AM      Result Value Ref Range   Specimen Description BLOOD LEFT HAND     Special Requests       Value: BOTTLES DRAWN AEROBIC AND ANAEROBIC AEB 6CC ANA 2CC   Culture NO GROWTH <24 HRS     Report Status PENDING    PREALBUMIN     Status: Abnormal   Collection Time    08/14/14 12:25 AM      Result Value Ref Range   Prealbumin 7.0 (*) 17.0 - 34.0 mg/dL   Comment: Performed at [facility]  TROPONIN I     Status: None   Collection Time    08/14/14 12:41 AM      Result Value Ref Range   Troponin I <0.30  <0.30 ng/mL   Comment:            Due to the release kinetics of cTnI,     a negative result within the first hours     of the onset of symptoms does not rule out     myocardial infarction with certainty.     If myocardial infarction is still suspected,     repeat the test at appropriate intervals.  HEMOGLOBIN A1C     Status: None   Collection Time    08/14/14 12:41 AM      Result Value Ref Range   Hemoglobin A1C 5.6  <5.7 %   Comment: (NOTE)                                                                               According to the ADA Clinical Practice Recommendations for 2011, when     HbA1c is used as a screening test:      >=6.5%   Diagnostic of Diabetes Mellitus               (if abnormal result is confirmed)     5.7-6.4%   Increased risk of developing Diabetes Mellitus     References:Diagnosis and Classification of Diabetes Mellitus,Diabetes      VQMG,8676,19(JKDTO 1):S62-S69 and Standards of Medical Care in             Diabetes - 2011,Diabetes Care,2011,34 (Suppl 1):S11-S61.   Mean Plasma Glucose 114  <117 mg/dL   Comment: Performed at [facility]     Status: Abnormal   Collection Time    08/14/14 12:41 AM      Result Value Ref Range   Vitamin B-12 88 (*) 211 - 911 pg/mL   Comment: Performed at [facility]     Status: None   Collection Time    08/14/14 12:41 AM      Result Value Ref Range   Folate >20.0     Comment: (NOTE)     Reference Ranges            Deficient:       0.4 -  3.3 ng/mL            Indeterminate:   3.4 - 5.4 ng/mL            Normal:              > 5.4 ng/mL     Performed at [facility]     Status: Abnormal   Collection Time    08/14/14 12:41 AM      Result Value Ref Range   Iron 50  42 - 135 ug/dL   TIBC 187 (*) 215 - 435 ug/dL   Saturation Ratios 27  20 - 55 %   UIBC 137  125 - 400 ug/dL   Comment: Performed at [facility]     Status: None   Collection Time    08/14/14 12:41 AM      Result Value Ref Range   Ferritin 254  22 - 322 ng/mL   Comment: Performed at [facility]     Status: Abnormal   Collection Time    08/14/14 12:41 AM      Result Value Ref Range   Retic Ct Pct 0.6  0.4 - 3.1 %   RBC. 2.02 (*) 4.22 - 5.81 MIL/uL   Retic Count, Manual 12.1 (*) 19.0 - 186.0 K/uL  COMPREHENSIVE METABOLIC PANEL     Status: Abnormal   Collection Time    08/14/14  5:59 AM      Result Value Ref Range   Sodium 140  137 - 147 mEq/L   Potassium 4.1  3.7 - 5.3 mEq/L   Chloride 103  96 - 112 mEq/L   CO2 26  19 - 32 mEq/L   Glucose, Bld 80  70 - 99 mg/dL   BUN 29 (*) 6 - 23 mg/dL   Creatinine, Ser 0.86  0.50 - 1.35 mg/dL   Calcium 8.4  8.4 - 10.5 mg/dL   Total Protein 6.0  6.0 - 8.3 g/dL   Albumin 3.1 (*) 3.5 - 5.2 g/dL   AST 13  0 - 37 U/L   ALT 6  0 - 53 U/L   Alkaline Phosphatase 42  39 - 117  U/L   Total Bilirubin 1.7 (*) 0.3 - 1.2 mg/dL   GFR calc non Af Amer 89 (*) >90 mL/min   GFR calc Af Amer >90  >90 mL/min   Comment: (NOTE)     The eGFR has been calculated using the CKD EPI equation.     This calculation has not been validated in all clinical situations.     eGFR's persistently <90 mL/min signify possible Chronic Kidney     Disease.   Anion gap 11  5 - 15  CBC WITH DIFFERENTIAL     Status: Abnormal   Collection Time    08/14/14  5:59 AM      Result Value Ref Range   WBC 3.1 (*) 4.0 - 10.5 K/uL   RBC 1.95 (*) 4.22 - 5.81 MIL/uL   Hemoglobin 7.7 (*) 13.0 - 17.0 g/dL   HCT 22.5 (*) 39.0 - 52.0 %   MCV 115.4 (*) 78.0 - 100.0 fL   MCH 39.5 (*) 26.0 - 34.0 pg   MCHC 34.2  30.0 - 36.0 g/dL   RDW 19.3 (*) 11.5 - 15.5 %   Platelets 111 (*) 150 - 400 K/uL   Comment: SPECIMEN CHECKED FOR CLOTS     PLATELET COUNT CONFIRMED BY  SMEAR   Neutrophils Relative % 47  43 - 77 %   Neutro Abs 1.4 (*) 1.7 - 7.7 K/uL   Lymphocytes Relative 49 (*) 12 - 46 %   Lymphs Abs 1.5  0.7 - 4.0 K/uL   Monocytes Relative 2 (*) 3 - 12 %   Monocytes Absolute 0.1  0.1 - 1.0 K/uL   Eosinophils Relative 2  0 - 5 %   Eosinophils Absolute 0.1  0.0 - 0.7 K/uL   Basophils Relative 0  0 - 1 %   Basophils Absolute 0.0  0.0 - 0.1 K/uL  TROPONIN I     Status: None   Collection Time    08/14/14  5:59 AM      Result Value Ref Range   Troponin I <0.30  <0.30 ng/mL   Comment:            Due to the release kinetics of cTnI,     a negative result within the first hours     of the onset of symptoms does not rule out     myocardial infarction with certainty.     If myocardial infarction is still suspected,     repeat the test at appropriate intervals.  LIPID PANEL     Status: Abnormal   Collection Time    08/14/14  5:59 AM      Result Value Ref Range   Cholesterol 111  0 - 200 mg/dL   Triglycerides 104  <150 mg/dL   HDL 18 (*) >39 mg/dL   Total CHOL/HDL Ratio 6.2     VLDL 21  0 - 40 mg/dL   LDL  Cholesterol 72  0 - 99 mg/dL   Comment:            Total Cholesterol/HDL:CHD Risk     Coronary Heart Disease Risk Table                         Men   Women      1/2 Average Risk   3.4   3.3      Average Risk       5.0   4.4      2 X Average Risk   9.6   7.1      3 X Average Risk  23.4   11.0                Use the calculated Patient Ratio     above and the CHD Risk Table     to determine the patient's CHD Risk.                ATP III CLASSIFICATION (LDL):      <100     mg/dL   Optimal      100-129  mg/dL   Near or Above                        Optimal      130-159  mg/dL   Borderline      160-189  mg/dL   High      >190     mg/dL   Very High  TROPONIN I     Status: None   Collection Time    08/14/14 12:04 PM      Result Value Ref Range   Troponin I <0.30  <0.30 ng/mL   Comment:  Due to the release kinetics of cTnI,     a negative result within the first hours     of the onset of symptoms does not rule out     myocardial infarction with certainty.     If myocardial infarction is still suspected,     repeat the test at appropriate intervals.  CBC     Status: Abnormal   Collection Time    08/14/14  3:31 PM      Result Value Ref Range   WBC 3.4 (*) 4.0 - 10.5 K/uL   RBC 2.21 (*) 4.22 - 5.81 MIL/uL   Hemoglobin 9.0 (*) 13.0 - 17.0 g/dL   HCT 25.7 (*) 39.0 - 52.0 %   MCV 116.3 (*) 78.0 - 100.0 fL   MCH 40.7 (*) 26.0 - 34.0 pg   MCHC 35.0  30.0 - 36.0 g/dL   RDW 19.2 (*) 11.5 - 15.5 %   Platelets 123 (*) 150 - 400 K/uL    Studies/Results:  CT ABD/CHEST Multiple wedge-shaped areas of atelectasis or scarring demonstrated  in the right middle and lower lungs.  Thickening of the bladder wall with infiltration in the fat around  the bladder. It multiple bladder diverticula. Bladder stones.  Findings likely due to cystitis, possibly associated with chronic  bladder outlet obstruction. Enlarged lymph nodes in the pelvis.  Bladder neoplasm not excluded.  BRAIN  MRI Exam is motion degraded.  No acute infarct.  Questionable subtle T2 increased signal within the uncus/hippocampus  and subinsular region bilaterally. With this distribution, infection  such as herpes encephalitis cannot be excluded. There are however,  no changes on the diffusion sequence as can be seen with herpes  encephalitis and patient's symptoms have been going on for 2 months.  Clinical and laboratory correlation recommended.  Limbic encephalitis can present with a similar appearance. Result of  metabolic abnormality (vitamin-B deficiency) is a less likely  consideration. Primary brain tumor felt unlikely. If the patient had  persistent or progressive symptoms, close followup MR with contrast  may be considered.  Mild small vessel disease.  C3-4 disc protrusion with spinal stenosis and mild cord flattening.   [[[[[[[[[[[ The scan is reviewed in person. There is moderate atrophy and a moderate deep and periventricular leukoencephalopathy. Nothing acute is clearly seen. The risks increased signal seen on T2 involving the medial structures around the third ventricle and medial temporal area. However, this area is prone to artifact.]]]]]]]]]]]]]]]]]]]]]]]]]]]]]]    Edwin [name] A. Merlene Laughter, M.D.  Diplomate, Tax adviser of Psychiatry and Neurology ( Neurology). 08/14/2014, 6:53 PM

## 2014-08-14 NOTE — Progress Notes (Signed)
Pt's son is now in the room. Pt gives permission to discuss care with son. Will continue to monitor pt.

## 2014-08-14 NOTE — Progress Notes (Signed)
INITIAL NUTRITION ASSESSMENT  DOCUMENTATION CODES Per approved criteria  -Non-severe (moderate) malnutrition in the context of chronic illness   Pt meets criteria for moderate MALNUTRITION in the context of chronic illness as evidenced by mild fat and muscle depletion, <75% of estimated energy intake x 1 month.  INTERVENTION: Ensure Complete po BID, each supplement provides 350 kcal and 13 grams of protein  NUTRITION DIAGNOSIS: Inadequate oral intake related to altered taste perception as evidenced by diet recall, mild fat and muscle depletion.   Goal: Pt will meet >90% of estimated nutritional needs  Monitor:  PO/supplement intake, labs, weight changes, I/O's  Reason for Assessment: MST=2  63 y.o. male  Admitting Dx: <principal problem not specified>  This is a 63 y.o. year old male with no prior medical care presenting with encephalopathy and failure to thrive. Family states that pt lives alone. He has never seen a doctor per pt and family. Family states that pt has had progressive weakness, decreased appetite, confusion over past 2 months. No known prior history of medical problems. Pt denies any ETOH or tobacco abuse. Does report progressive weight loss and decreased appetite over extended period of time. Per report, pt was found covered in his own fecal material and urine at home today by EMS.   ASSESSMENT: Pt confirms weight loss and poor appetite, however, unable to quantify time frame. He reports he can tell he has lost weight, as he has had to adjust his belt buckle.  He complains of altered taste perception ("everything tastes the same"). Noted that pt has multiple teeth missing, but denies any difficulty chewing foods. He lives alone and does his cooking and food preparation by himself, however, he has a son who lives in town who is very supportive.  Diet recall consists of two meals per day. He reports he always skips dinner. Breakfast consists of eggs, sausage, toast, and  coffee with sugar. He is unable to recall what he usually eats for lunch.  He reports UBW of 155-160#. He reveals he last weighed 160# a few months ago.  Pt reports he drinks Ensure at home, however, not on a consistent basis. However, he is very receptive to drink while in the hospital and verbalized interest in drinking after hospitalization. Encouraged supplement use after discharge. Also discussed examples of ways to increase protein in diet. Also encouraged pt to consume foods with bold flavors (ex. spicy or sweet) to combat altered taste perception. Pt very grateful for RD visit.  Labs reviewed. BUN: 29.   Nutrition Focused Physical Exam:  Subcutaneous Fat:  Orbital Region: mild depletion Upper Arm Region: mild depletion Thoracic and Lumbar Region: mild depletion  Muscle:  Temple Region: mild depletion Clavicle Bone Region: mild depletion Clavicle and Acromion Bone Region: mild depletion Scapular Bone Region: mild depletion Dorsal Hand: mild depletion Patellar Region: mild depletion Anterior Thigh Region: mild depletion Posterior Calf Region: mild depletion  Edema: none present   Height: Ht Readings from Last 1 Encounters:  08/14/14 5\' 7"  (1.702 m)    Weight: Wt Readings from Last 1 Encounters:  08/14/14 155 lb 10.3 oz (70.6 kg)    Ideal Body Weight: 156#  % Ideal Body Weight: 99%  Wt Readings from Last 10 Encounters:  08/14/14 155 lb 10.3 oz (70.6 kg)    Usual Body Weight: 160#  % Usual Body Weight: 97%  BMI:  Body mass index is 24.37 kg/(m^2). Normal weight range  Estimated Nutritional Needs: Kcal: 1900-2100 Protein: 85-95 grams  Fluid: 1.9-2.1  L  Skin: rashes on penis perineum, sacrum, and scrotum   Diet Order: Dysphagia 2  EDUCATION NEEDS: -Education needs addressed   Intake/Output Summary (Last 24 hours) at 08/14/14 0853 Last data filed at 08/14/14 [id number]  Gross per 24 hour  Intake   1250 ml  Output    100 ml  Net   1150 ml    Last BM:  08/14/14  Labs:   Recent Labs Lab 08/13/14 2126 08/14/14 0559  NA 140 140  K 4.0 4.1  CL 100 103  CO2 25 26  BUN 32* 29*  CREATININE 0.92 0.86  CALCIUM 9.4 8.4  GLUCOSE 83 80    CBG (last 3)  No results found for this basename: GLUCAP,  in the last 72 hours  Scheduled Meds: . heparin  5,000 Units Subcutaneous 3 times per day  . piperacillin-tazobactam (ZOSYN)  IV  3.375 g Intravenous Q8H  . vancomycin  750 mg Intravenous Q8H    Continuous Infusions: . sodium chloride 125 mL/hr at 08/14/14 0015    History reviewed. No pertinent past medical history.  History reviewed. No pertinent past surgical history.  Imanie Darrow A. Mayford KnifeWilliams, RD, LDN Pager: [phone] After hours Pager: [phone]

## 2014-08-14 NOTE — Progress Notes (Signed)
ANTIBIOTIC CONSULT NOTE - FOLLOW UP  Pharmacy Consult for Vancomycin and Zosyn  Indication: encephalopathy, FTT, UTI  No Known Allergies  Patient Measurements: Height: 5\' 7"  (170.2 cm) (5'7') Weight: 155 lb 10.3 oz (70.6 kg) IBW/kg (Calculated) : 66.1  Vital Signs: Temp: 98.6 F (37 C) (10/20 0558) Temp Source: Oral (10/20 0558) BP: 126/53 mmHg (10/20 0558) Pulse Rate: 73 (10/20 0558) Intake/Output from previous day: 10/19 0701 - 10/20 0700 In: 1250 [I.V.:1050; IV Piggyback:200] Out: 100 [Urine:100] Intake/Output from this shift:   Labs:  Recent Labs  08/13/14 2126 08/14/14 0559  WBC 4.2 3.1*  HGB 9.0* 7.7*  PLT 137* 111*  CREATININE 0.92 0.86   Estimated Creatinine Clearance: 80.1 ml/min (by C-G formula based on Cr of 0.86). No results found for this basename: VANCOTROUGH, Leodis BinetVANCOPEAK, VANCORANDOM, GENTTROUGH, GENTPEAK, GENTRANDOM, TOBRATROUGH, TOBRAPEAK, TOBRARND, AMIKACINPEAK, AMIKACINTROU, AMIKACIN,  in the last 72 hours   Microbiology: Recent Results (from the past 720 hour(s))  CULTURE, BLOOD (ROUTINE X 2)     Status: None   Collection Time    08/14/14 12:13 AM      Result Value Ref Range Status   Specimen Description BLOOD LEFT ANTECUBITAL   Final   Special Requests     Final   Value: BOTTLES DRAWN AEROBIC AND ANAEROBIC AEB 2CC ANA 6CC   Culture PENDING   Incomplete   Report Status PENDING   Incomplete  CULTURE, BLOOD (ROUTINE X 2)     Status: None   Collection Time    08/14/14 12:13 AM      Result Value Ref Range Status   Specimen Description BLOOD LEFT HAND   Final   Special Requests     Final   Value: BOTTLES DRAWN AEROBIC AND ANAEROBIC AEB 6CC ANA 2CC   Culture PENDING   Incomplete   Report Status PENDING   Incomplete    Anti-infectives   Start     Dose/Rate Route Frequency Ordered Stop   08/14/14 0100  vancomycin (VANCOCIN) IVPB 750 mg/150 ml premix     750 mg 150 mL/hr over 60 Minutes Intravenous  Once 08/14/14 0045 08/14/14 0404   08/14/14  0100  piperacillin-tazobactam (ZOSYN) IVPB 3.375 g     3.375 g 12.5 mL/hr over 240 Minutes Intravenous  Once 08/14/14 0046 08/14/14 0704   08/13/14 2345  cefTRIAXone (ROCEPHIN) 1 g in dextrose 5 % 50 mL IVPB     1 g 100 mL/hr over 30 Minutes Intravenous  Once 08/13/14 2338 08/14/14 0022     Assessment: Okay for Protocol, initial doses started last evening.  63 y.o. year old male presenting with encephalopathy, failure to thrive, UTI  Goal of Therapy:  Vancomycin trough level 15-20 mcg/ml  Plan:  Zosyn 3.375gm IV every 8 hours. Follow-up micro data, labs, vitals.  Vancomycin 750mg  IV every 8 hours. Measure antibiotic drug levels at steady state Follow up culture results  Lamonte RicherHayes, Cainan Trull R 08/14/2014,7:36 AM

## 2014-08-14 NOTE — Progress Notes (Signed)
ANTIBIOTIC CONSULT NOTE-Preliminary  Pharmacy Consult for Vancomycin and Zosyn Indication: Encephalopathy  No Known Allergies  Patient Measurements: Height: 5\' 7"  (170.2 cm) Weight: 150 lb (68.04 kg) IBW/kg (Calculated) : 66.1  Vital Signs: Temp: 99 F (37.2 C) (10/19 2042) Temp Source: Oral (10/19 2042) BP: 129/72 mmHg (10/19 2300) Pulse Rate: 82 (10/19 2308)  Labs:  Recent Labs  08/13/14 2126  WBC 4.2  HGB 9.0*  PLT 137*  CREATININE 0.92    Estimated Creatinine Clearance: 74.8 ml/min (by C-G formula based on Cr of 0.92).  No results found for this basename: VANCOTROUGH, VANCOPEAK, VANCORANDOM, GENTTROUGH, GENTPEAK, GENTRANDOM, TOBRATROUGH, TOBRAPEAK, TOBRARND, AMIKACINPEAK, AMIKACINTROU, AMIKACIN,  in the last 72 hours   Microbiology: No results found for this or any previous visit (from the past 720 hour(s)).  Medical History: History reviewed. No pertinent past medical history.  Medications:  Ceftriaxone 1 Gm in the ED  Assessment: 63 yo male seen in the ED with encephalopathy and failure to thrive. Pt has UTI. Broad spectrum antibiotics to be started for possible infectious etiology of encephalopathy  Goal of Therapy:  Vancomycin troughs 15-20 mcg/ml Eradicate infection  Plan:  Preliminary review of pertinent patient information completed.  Protocol will be initiated with one-time doses of Vancomycin 750 mg IV and Zosyn 3.375 Gm IV.  Jeani HawkingAnnie Penn clinical pharmacist will complete review during morning rounds to assess patient and finalize treatment regimen.  Arelia SneddonMason, Petrice Beedy Anne, [facility] CenterRPH 08/14/2014,12:49 AM

## 2014-08-15 ENCOUNTER — Encounter (HOSPITAL_COMMUNITY): Payer: Self-pay

## 2014-08-15 ENCOUNTER — Inpatient Hospital Stay (HOSPITAL_COMMUNITY): Payer: Medicare Other

## 2014-08-15 ENCOUNTER — Other Ambulatory Visit (HOSPITAL_COMMUNITY): Payer: Medicare Other

## 2014-08-15 DIAGNOSIS — I82401 Acute embolism and thrombosis of unspecified deep veins of right lower extremity: Secondary | ICD-10-CM

## 2014-08-15 DIAGNOSIS — R627 Adult failure to thrive: Secondary | ICD-10-CM

## 2014-08-15 DIAGNOSIS — D531 Other megaloblastic anemias, not elsewhere classified: Secondary | ICD-10-CM

## 2014-08-15 DIAGNOSIS — E559 Vitamin D deficiency, unspecified: Secondary | ICD-10-CM

## 2014-08-15 DIAGNOSIS — E538 Deficiency of other specified B group vitamins: Secondary | ICD-10-CM

## 2014-08-15 DIAGNOSIS — I82411 Acute embolism and thrombosis of right femoral vein: Secondary | ICD-10-CM

## 2014-08-15 DIAGNOSIS — E860 Dehydration: Secondary | ICD-10-CM

## 2014-08-15 HISTORY — DX: Other megaloblastic anemias, not elsewhere classified: D53.1

## 2014-08-15 LAB — COMPREHENSIVE METABOLIC PANEL
ALT: 6 U/L (ref 0–53)
AST: 12 U/L (ref 0–37)
Albumin: 3 g/dL — ABNORMAL LOW (ref 3.5–5.2)
Alkaline Phosphatase: 37 U/L — ABNORMAL LOW (ref 39–117)
Anion gap: 9 (ref 5–15)
BUN: 20 mg/dL (ref 6–23)
CALCIUM: 8.5 mg/dL (ref 8.4–10.5)
CO2: 26 mEq/L (ref 19–32)
Chloride: 104 mEq/L (ref 96–112)
Creatinine, Ser: 0.98 mg/dL (ref 0.50–1.35)
GFR calc Af Amer: >90 mL/min (ref >90)
GFR calc non Af Amer: 84 mL/min — ABNORMAL LOW (ref >90)
Glucose, Bld: 91 mg/dL (ref 70–99)
Potassium: 3.6 mEq/L — ABNORMAL LOW (ref 3.7–5.3)
SODIUM: 139 meq/L (ref 137–147)
TOTAL PROTEIN: 5.7 g/dL — AB (ref 6.0–8.3)
Total Bilirubin: 1.2 mg/dL (ref 0.3–1.2)

## 2014-08-15 LAB — URINE CULTURE
Colony Count: NO GROWTH
Culture: NO GROWTH

## 2014-08-15 LAB — CBC WITH DIFFERENTIAL/PLATELET
BASOS ABS: 0 10*3/uL (ref 0.0–0.1)
Basophils Relative: 0 % (ref 0–1)
EOS ABS: 0.1 10*3/uL (ref 0.0–0.7)
EOS PCT: 2 % (ref 0–5)
HCT: 21.8 % — ABNORMAL LOW (ref 39.0–52.0)
Hemoglobin: 7.7 g/dL — ABNORMAL LOW (ref 13.0–17.0)
Lymphocytes Relative: 38 % (ref 12–46)
Lymphs Abs: 1.2 10*3/uL (ref 0.7–4.0)
MCH: 40.3 pg — AB (ref 26.0–34.0)
MCHC: 35.3 g/dL (ref 30.0–36.0)
MCV: 114.1 fL — AB (ref 78.0–100.0)
Monocytes Absolute: 0.1 10*3/uL (ref 0.1–1.0)
Monocytes Relative: 2 % — ABNORMAL LOW (ref 3–12)
Neutro Abs: 1.8 10*3/uL (ref 1.7–7.7)
Neutrophils Relative %: 57 % (ref 43–77)
Platelets: 111 10*3/uL — ABNORMAL LOW (ref 150–400)
RBC: 1.91 MIL/uL — ABNORMAL LOW (ref 4.22–5.81)
RDW: 19 % — AB (ref 11.5–15.5)
WBC: 3.1 10*3/uL — AB (ref 4.0–10.5)

## 2014-08-15 LAB — PSA: PSA: 0.19 ng/mL (ref <=4.00)

## 2014-08-15 LAB — FREE PSA
PSA, Free Pct: 16 % — ABNORMAL LOW (ref >25)
PSA, Free: <0.1 ng/mL

## 2014-08-15 LAB — RPR

## 2014-08-15 LAB — VITAMIN D 25 HYDROXY (VIT D DEFICIENCY, FRACTURES)

## 2014-08-15 MED ORDER — CYANOCOBALAMIN 1000 MCG/ML IJ SOLN
1000.0000 ug | Freq: Every day | INTRAMUSCULAR | Status: AC
  Administered 2014-08-16 – 2014-08-22 (×7): 1000 ug via INTRAMUSCULAR
  Filled 2014-08-15 (×8): qty 1

## 2014-08-15 MED ORDER — CIPROFLOXACIN HCL 250 MG PO TABS
500.0000 mg | ORAL_TABLET | Freq: Two times a day (BID) | ORAL | Status: AC
  Administered 2014-08-15 – 2014-08-20 (×10): 500 mg via ORAL
  Filled 2014-08-15 (×12): qty 2

## 2014-08-15 MED ORDER — APIXABAN 5 MG PO TABS: 5.0000 mg | ORAL_TABLET | Freq: Two times a day (BID) | ORAL | Status: DC

## 2014-08-15 MED ORDER — APIXABAN 5 MG PO TABS
10.0000 mg | ORAL_TABLET | Freq: Two times a day (BID) | ORAL | Status: DC
  Administered 2014-08-15 – 2014-08-16 (×2): 10 mg via ORAL
  Filled 2014-08-15 (×3): qty 2

## 2014-08-15 MED ORDER — CHOLECALCIFEROL 400 UNIT/ML PO LIQD
400.0000 [IU] | Freq: Every day | ORAL | Status: DC
  Administered 2014-08-15: 400 [IU] via ORAL
  Filled 2014-08-15 (×3): qty 1

## 2014-08-15 MED ORDER — TECHNETIUM TO 99M ALBUMIN AGGREGATED
6.0000 | Freq: Once | INTRAVENOUS | Status: AC | PRN
Start: 2014-08-15 — End: 2014-08-15
  Administered 2014-08-15: 6 via INTRAVENOUS

## 2014-08-15 MED ORDER — PRENATAL MULTIVITAMIN CH
1.0000 | ORAL_TABLET | Freq: Every day | ORAL | Status: DC
  Administered 2014-08-16 – 2014-08-22 (×7): 1 via ORAL
  Filled 2014-08-15 (×9): qty 1

## 2014-08-15 MED ORDER — TECHNETIUM TC 99M DIETHYLENETRIAME-PENTAACETIC ACID
40.0000 | Freq: Once | INTRAVENOUS | Status: AC | PRN
  Administered 2014-08-15: 40 via RESPIRATORY_TRACT

## 2014-08-15 NOTE — Progress Notes (Addendum)
[facility] PROGRESS NOTE  Edwin [name] XHB:[id number] DOB: 1951/05/01 DOA: 08/13/2014 PCP: No primary provider on file.  Assessment/Plan: Acute Encephalopathy -Multifactorial, but at this point appears to be most likely 2/2 severe nutritional deficiencies and UTI. -Continue vitamin B1/B12 replacement (thiamine deficiencies may explain his MRI findings).  -EEG pending.  Severe Nutritional Deficiencies/Severe Protein-Caloric Malnutrition/Adult FTT -B1/B12 supplementation. -Add MVI, Vit D Supplementation. -Will request nutrition consultation. -Suspect will be at risk for refeeding syndrome.  Megaloblastic Anemia due to B12 deficiency -Hb 7.7. -No need for transfusion at this time. -No signs of active bleeding. -IM B12 supplementation. -Folic acid >20.  DVT -VQ negative for PE. -Continue eliquis for 6 months as per heme/onc recommendations.  UTI -Dirty UA but cx negative. -Will elect to treat with cipro PO for 5 days. -DC zosyn/vanc  Pancytopenia -Suspect related to nutritional deficiencies. -Follow CBC in am.  Code Status: Full Code Family Communication: Patient only  Disposition Plan: To be determined. Suspect will need SNF   Consultants:  Neurology, Dr. Gerilyn Pilgrim   Antibiotics:  Cipro   Subjective: Pleasantly confused  Objective: Filed Vitals:   08/14/14 1400 08/14/14 2125 08/15/14 0607 08/15/14 1329  BP: 122/82 101/58 109/61 112/54  Pulse: 72 66 70 74  Temp: 97.9 F (36.6 C) 98.6 F (37 C) 98.8 F (37.1 C) 98.8 F (37.1 C)  TempSrc:  Oral Oral Oral  Resp: 20 15 20 19   Height:      Weight:   72.1 kg (158 lb 15.2 oz)   SpO2: 100% 100% 100% 100%    Intake/Output Summary (Last 24 hours) at 08/15/14 1900 Last data filed at 08/15/14 1751  Gross per 24 hour  Intake    710 ml  Output    875 ml  Net   -165 ml   Filed Weights   08/14/14 0142 08/14/14 0558 08/15/14 0607  Weight: 69.219 kg (152 lb 9.6 oz) 70.6 kg (155 lb 10.3 oz) 72.1 kg  (158 lb 15.2 oz)    Exam:   General:  awake  Cardiovascular: RRR  Respiratory: CTA B  Abdomen: S/ND/NT/+BS  Extremities: no C/C/E/+pulses   Neurologic:  Non-focal  Data Reviewed: Basic Metabolic Panel:  Recent Labs Lab 08/13/14 2126 08/14/14 0559 08/15/14 0540  NA 140 140 139  K 4.0 4.1 3.6*  CL 100 103 104  CO2 25 26 26   GLUCOSE 83 80 91  BUN 32* 29* 20  CREATININE 0.92 0.86 0.98  CALCIUM 9.4 8.4 8.5   Liver Function Tests:  Recent Labs Lab 08/13/14 2126 08/14/14 0559 08/15/14 0540  AST 14 13 12   ALT 7 6 6   ALKPHOS 49 42 37*  BILITOT 2.2* 1.7* 1.2  PROT 7.1 6.0 5.7*  ALBUMIN 3.7 3.1* 3.0*   No results found for this basename: LIPASE, AMYLASE,  in the last 168 hours  Recent Labs Lab 08/14/14 0008  AMMONIA 21   CBC:  Recent Labs Lab 08/13/14 2126 08/14/14 0559 08/14/14 1531 08/15/14 0540  WBC 4.2 3.1* 3.4* 3.1*  NEUTROABS 2.7 1.4*  --  1.8  HGB 9.0* 7.7* 9.0* 7.7*  HCT 25.6* 22.5* 25.7* 21.8*  MCV 114.8* 115.4* 116.3* 114.1*  PLT 137* 111* 123* 111*   Cardiac Enzymes:  Recent Labs Lab 08/13/14 2126 08/14/14 0041 08/14/14 0559 08/14/14 1204  TROPONINI <0.30 <0.30 <0.30 <0.30   BNP (last 3 results)  Recent Labs  08/13/14 2224  PROBNP 82.8   CBG: No results found for this basename: GLUCAP,  in the last  168 hours  Recent Results (from the past 240 hour(s))  URINE CULTURE     Status: None   Collection Time    08/13/14 11:02 PM      Result Value Ref Range Status   Specimen Description URINE, CATHETERIZED   Final   Special Requests NONE   Final   Culture  Setup Time     Final   Value: 08/14/2014 13:59     [facility]   Colony Count     Final   Value: NO GROWTH     [facility]formed at [facility]   Culture     Final   Value: NO GROWTH     Performed at [facility]   Report Status 08/15/2014 FINAL   Final  CULTURE, BLOOD (ROUTINE X 2)     Status: None   Collection Time    08/14/14 12:13  AM      Result Value Ref Range Status   Specimen Description BLOOD LEFT ANTECUBITAL   Final   Special Requests     Final   Value: BOTTLES DRAWN AEROBIC AND ANAEROBIC AEB=2CC ANA=6CC   Culture NO GROWTH 1 DAY   Final   Report Status PENDING   Incomplete  CULTURE, BLOOD (ROUTINE X 2)     Status: None   Collection Time    08/14/14 12:13 AM      Result Value Ref Range Status   Specimen Description BLOOD LEFT HAND   Final   Special Requests     Final   Value: BOTTLES DRAWN AEROBIC AND ANAEROBIC AEB=6CC ANA=2CC   Culture NO GROWTH 1 DAY   Final   Report Status PENDING   Incomplete     Studies: Ct Abdomen Pelvis Wo Contrast  08/14/2014   CLINICAL DATA:  Abdominal pain, generalized. Chest pain. Incontinence for 2 months.  EXAM: CT CHEST, ABDOMEN AND PELVIS WITHOUT CONTRAST  TECHNIQUE: Multidetector CT imaging of the chest, abdomen and pelvis was performed following the standard protocol without IV contrast.  COMPARISON:  None.  FINDINGS: CT CHEST FINDINGS  Normal heart size. Normal caliber thoracic aorta. Esophagus is decompressed. Small esophageal hiatal hernia. No significant lymphadenopathy in the chest.  Focal wedge-shaped areas of atelectasis or scarring in the right middle and lower lungs. No airspace disease in the lungs. No pneumothorax. No pleural effusions.  CT ABDOMEN AND PELVIS FINDINGS  Cholelithiasis without additional inflammatory changes. Diffuse fatty infiltration of the pancreas. The unenhanced appearance of the liver, spleen, adrenal glands, kidneys, abdominal aorta, inferior vena cava, and retroperitoneal lymph nodes is unremarkable. Stomach, small bowel, and colon are decompressed. No free fluid or free air in the abdomen.  Pelvis: The bladder wall is diffusely thickened and there is infiltration in the fat around the bladder. Multiple bladder diverticula, including a large diverticulum arising from the dome of the bladder. Increased density in the posterior bladder and extending  into a diverticulum, likely representing a bladder stone. Findings suggest cystitis, possibly associated with chronic bladder outlet obstruction. Enlarged lymph nodes are present adjacent to the bladder. Neoplastic involvement is not excluded. Prostate gland is not significantly enlarged. No free or loculated pelvic fluid collections are demonstrated. Appendix is normal. No evidence of diverticulitis. Degenerative changes in the thoracic and lumbar spine. No destructive bone lesions.  IMPRESSION: Multiple wedge-shaped areas of atelectasis or scarring demonstrated in the right middle and lower lungs.  Thickening of the bladder wall with infiltration in the fat around the bladder. It multiple  bladder diverticula. Bladder stones. Findings likely due to cystitis, possibly associated with chronic bladder outlet obstruction. Enlarged lymph nodes in the pelvis. Bladder neoplasm not excluded.   Electronically Signed   By: Burman Nieves M.D.   On: 08/14/2014 02:39   Dg Chest 2 View  08/15/2014   CLINICAL DATA:  Deep venous thrombosis.  EXAM: CHEST  2 VIEW  COMPARISON:  August 13, 2014.  FINDINGS: The heart size and mediastinal contours are within normal limits. Both lungs are clear. No pneumothorax is noted. Minimal bilateral pleural effusions are noted posteriorly. The visualized skeletal structures are unremarkable.  IMPRESSION: Minimal bilateral pleural effusions. No other significant abnormality seen in the chest.   Electronically Signed   By: Roque Lias M.D.   On: 08/15/2014 10:51   Dg Chest 2 View  08/13/2014   CLINICAL DATA:  Failure to thrive. A initial complaint to EMS was chest pain. Patient has altered mental status.  EXAM: CHEST  2 VIEW  COMPARISON:  None.  FINDINGS: Cardiac silhouette is normal in size. Normal mediastinal and hilar contours.  Clear lungs.  No pleural effusion or pneumothorax.  Bony thorax is intact.  IMPRESSION: No active cardiopulmonary disease.   Electronically Signed   By: Amie Portland M.D.   On: 08/13/2014 22:56   Ct Head Wo Contrast  08/13/2014   CLINICAL DATA:  64 year old male found with altered mental status, dementia like activity. Initial encounter.  EXAM: CT HEAD WITHOUT CONTRAST  TECHNIQUE: Contiguous axial images were obtained from the base of the skull through the vertex without intravenous contrast.  COMPARISON:  None.  FINDINGS: Mild paranasal sinus mucosal thickening. Mastoids are clear. No acute osseous abnormality identified. Visualized orbits and scalp soft tissues are within normal limits.  Cerebral volume is within normal limits for age. No suspicious intracranial vascular hyperdensity. No midline shift, ventriculomegaly, mass effect, evidence of mass lesion, intracranial hemorrhage or evidence of cortically based acute infarction. Gray-white matter differentiation is within normal limits throughout the brain.  IMPRESSION: Normal for age non contrast CT appearance of the brain.   Electronically Signed   By: Augusto Gamble M.D.   On: 08/13/2014 23:03   Ct Chest Wo Contrast  08/14/2014   CLINICAL DATA:  Abdominal pain, generalized. Chest pain. Incontinence for 2 months.  EXAM: CT CHEST, ABDOMEN AND PELVIS WITHOUT CONTRAST  TECHNIQUE: Multidetector CT imaging of the chest, abdomen and pelvis was performed following the standard protocol without IV contrast.  COMPARISON:  None.  FINDINGS: CT CHEST FINDINGS  Normal heart size. Normal caliber thoracic aorta. Esophagus is decompressed. Small esophageal hiatal hernia. No significant lymphadenopathy in the chest.  Focal wedge-shaped areas of atelectasis or scarring in the right middle and lower lungs. No airspace disease in the lungs. No pneumothorax. No pleural effusions.  CT ABDOMEN AND PELVIS FINDINGS  Cholelithiasis without additional inflammatory changes. Diffuse fatty infiltration of the pancreas. The unenhanced appearance of the liver, spleen, adrenal glands, kidneys, abdominal aorta, inferior vena cava, and  retroperitoneal lymph nodes is unremarkable. Stomach, small bowel, and colon are decompressed. No free fluid or free air in the abdomen.  Pelvis: The bladder wall is diffusely thickened and there is infiltration in the fat around the bladder. Multiple bladder diverticula, including a large diverticulum arising from the dome of the bladder. Increased density in the posterior bladder and extending into a diverticulum, likely representing a bladder stone. Findings suggest cystitis, possibly associated with chronic bladder outlet obstruction. Enlarged lymph nodes are present adjacent to  the bladder. Neoplastic involvement is not excluded. Prostate gland is not significantly enlarged. No free or loculated pelvic fluid collections are demonstrated. Appendix is normal. No evidence of diverticulitis. Degenerative changes in the thoracic and lumbar spine. No destructive bone lesions.  IMPRESSION: Multiple wedge-shaped areas of atelectasis or scarring demonstrated in the right middle and lower lungs.  Thickening of the bladder wall with infiltration in the fat around the bladder. It multiple bladder diverticula. Bladder stones. Findings likely due to cystitis, possibly associated with chronic bladder outlet obstruction. Enlarged lymph nodes in the pelvis. Bladder neoplasm not excluded.   Electronically Signed   By: Burman Nieves M.D.   On: 08/14/2014 02:39   Mr Brain Wo Contrast  08/14/2014   CLINICAL DATA:  63 year old male with encephalopathy, failure to thrive, progressive weakness and confusion for the past 2 months. Subsequent encounter.  EXAM: MRI HEAD WITHOUT CONTRAST  TECHNIQUE: Multiplanar, multiecho pulse sequences of the brain and surrounding structures were obtained without intravenous contrast.  COMPARISON:  08/13/2014 CT.  No comparison MR.  FINDINGS: Exam is motion degraded.  No acute infarct.  No intracranial hemorrhage.  Questionable subtle T2 increased signal within the uncus/hippocampus and  subinsular region bilaterally. With this distribution, infection such as herpes encephalitis cannot be excluded. There are however, no changes on the diffusion sequence as can be seen with herpes encephalitis and patient's symptoms have been going on for 2 months. Clinical and laboratory correlation recommended. Limbic encephalitis can present with a similar appearance. Result of metabolic abnormality (vitamin-B deficiency) is a less likely consideration. Primary brain tumor felt unlikely. If the patient had persistent or progressive symptoms, close followup MR with contrast may be considered.  Patchy and punctate white matter type changes consistent with result of small vessel disease.  No hydrocephalus  No intracranial mass lesion seen from above described findings.  C3-4 disc protrusion with spinal stenosis and mild cord flattening.  Mild paranasal sinus mucosal thickening.  Major intracranial vascular structures are patent.  Small pituitary gland. Pineal region unremarkable. Orbital structures unremarkable.  IMPRESSION: Exam is motion degraded.  No acute infarct.  Questionable subtle T2 increased signal within the uncus/hippocampus and subinsular region bilaterally. With this distribution, infection such as herpes encephalitis cannot be excluded. There are however, no changes on the diffusion sequence as can be seen with herpes encephalitis and patient's symptoms have been going on for 2 months. Clinical and laboratory correlation recommended.  Limbic encephalitis can present with a similar appearance. Result of metabolic abnormality (vitamin-B deficiency) is a less likely consideration. Primary brain tumor felt unlikely. If the patient had persistent or progressive symptoms, close followup MR with contrast may be considered.  Mild small vessel disease.  C3-4 disc protrusion with spinal stenosis and mild cord flattening.  These results were called by telephone at the time of interpretation on 08/14/2014 at 9:24  am to Dr. Doree Albee , who verbally acknowledged these results.   Electronically Signed   By: Bridgett Larsson M.D.   On: 08/14/2014 09:37   Nm Pulmonary Perf And Vent  08/15/2014   CLINICAL DATA:  Altered mental status. No current shortness of breath or chest pain.  EXAM: NUCLEAR MEDICINE VENTILATION - PERFUSION LUNG SCAN  TECHNIQUE: Ventilation images were obtained in multiple projections using inhaled aerosol technetium 99 M DTPA. Perfusion images were obtained in multiple projections after intravenous injection of Tc-64m MAA.  RADIOPHARMACEUTICALS:  40.0 mCi Tc-53m DTPA aerosol and 6.0 mCi Tc-33m MAA  COMPARISON:  Chest radiograph, 08/15/2014.  FINDINGS: Ventilation: No focal ventilation defect.  Perfusion: No wedge shaped peripheral perfusion defects to suggest acute pulmonary embolism.  IMPRESSION: No evidence of a pulmonary embolism.   Electronically Signed   By: Amie Portlandavid  Ormond M.D.   On: 08/15/2014 11:45   [place] Venous Img Lower Unilateral Right  08/14/2014   CLINICAL DATA:  Right lower extremity pain and edema. Evaluate for DVT.  EXAM: RIGHT LOWER EXTREMITY VENOUS DOPPLER ULTRASOUND  TECHNIQUE: Gray-scale sonography with graded compression, as well as color Doppler and duplex ultrasound were performed to evaluate the lower extremity deep venous systems from the level of the common femoral vein and including the common femoral, femoral, profunda femoral, popliteal and calf veins including the posterior tibial, peroneal and gastrocnemius veins when visible. The superficial great saphenous vein was also interrogated. Spectral Doppler was utilized to evaluate flow at rest and with distal augmentation maneuvers in the common femoral, femoral and popliteal veins.  COMPARISON:  None.  FINDINGS: There is a minimal amount of eccentric mixed echogenic nonocclusive thrombus within right common femoral vein (images 2 and 4). Nonocclusive thrombus extends to involve the imaged portions of the proximal (images 13 and  14), mid (images 16 and 17) and distal (images 21 and 22) aspects of the right superficial femoral vein.  The right deep femoral and popliteal veins are widely patent. The greater saphenous vein appears widely patent throughout its imaged course.  Incidental note is made of a serpiginous approximately 2.8 x 0.7 cm fluid collection within the right popliteal fossa favored to represent Baker's cyst.  There is subcutaneous edema at the level of the calf. Note is made of two benign appearing right inguinal lymph nodes which are not enlarged by size criteria and maintain benign fatty hila.  IMPRESSION: 1. Examination is positive for nonocclusive thrombus extending from the level of the right common femoral vein to the distal aspect of the right superficial femoral vein. 2. Incidental note made of an approximately 2.8 cm right-sided Baker cyst. These results will be called to the ordering clinician or representative by the Radiologist Assistant, and communication documented in the PACS or zVision Dashboard.   Electronically Signed   By: Simonne ComeJohn  Watts M.D.   On: 08/14/2014 17:15    Scheduled Meds: . apixaban  10 mg Oral BID  . [START ON 08/22/2014] apixaban  5 mg Oral BID  . cholecalciferol  400 Units Oral Daily  . ciprofloxacin  500 mg Oral BID  . [START ON 08/16/2014] cyanocobalamin  1,000 mcg Intramuscular Daily  . feeding supplement (ENSURE COMPLETE)  237 mL Oral BID BM  . folic acid  1 mg Oral Daily  . pantoprazole  40 mg Oral Daily  . [START ON 08/16/2014] prenatal multivitamin  1 tablet Oral Q1200  . thiamine  100 mg Intravenous Daily   Continuous Infusions: . sodium chloride 70 mL/hr at 08/15/14 [id number]    Principal Problem:   Acute encephalopathy Active Problems:   Adult failure to thrive   UTI (lower urinary tract infection)   Leg edema, right   Pancytopenia   Severe protein-calorie malnutrition   Vitamin B12 deficiency   Right leg DVT   Megaloblastic anemia due to B12 deficiency    Vitamin D deficiency    Time spent: 45 minutes. Greater than 50% of this time was spent in direct contact with the patient coordinating care.    Chaya JanHERNANDEZ ACOSTA,ESTELA  [facility] Pager [phone]  If 7PM-7AM, please contact night-coverage at [email or web address], password [facility] 08/15/2014, 7:00 PM  LOS: 2 days

## 2014-08-15 NOTE — Progress Notes (Signed)
ANTICOAGULATION CONSULT NOTE - Initial Consult  Pharmacy Consult for Apixaban Indication: DVT  No Known Allergies  Patient Measurements: Height: 5\' 7"  (170.2 cm) (5'7') Weight: 158 lb 15.2 oz (72.1 kg) IBW/kg (Calculated) : 66.1  Vital Signs: Temp: 98.8 F (37.1 C) (10/21 1329) Temp Source: Oral (10/21 1329) BP: 112/54 mmHg (10/21 1329) Pulse Rate: 74 (10/21 1329)  Labs:  Recent Labs  08/13/14 2126 08/14/14 0041 08/14/14 0559 08/14/14 1204 08/14/14 1531 08/14/14 1817 08/15/14 0540  HGB 9.0*  --  7.7*  --  9.0*  --  7.7*  HCT 25.6*  --  22.5*  --  25.7*  --  21.8*  PLT 137*  --  111*  --  123*  --  111*  LABPROT  --   --   --   --   --  15.5*  --   INR  --   --   --   --   --  1.22  --   CREATININE 0.92  --  0.86  --   --   --  0.98  TROPONINI <0.30 <0.30 <0.30 <0.30  --   --   --    Estimated Creatinine Clearance: 70.3 ml/min (by C-G formula based on Cr of 0.98).  Medical History: History reviewed. No pertinent past medical history.  Medications:  Scheduled:  . apixaban  10 mg Oral BID  . [START ON 08/22/2014] apixaban  5 mg Oral BID  . cyanocobalamin  1,000 mcg Intramuscular Daily  . feeding supplement (ENSURE COMPLETE)  237 mL Oral BID BM  . folic acid  1 mg Oral Daily  . pantoprazole  40 mg Oral Daily  . piperacillin-tazobactam (ZOSYN)  IV  3.375 g Intravenous Q8H  . thiamine  100 mg Intravenous Daily  . vancomycin  750 mg Intravenous Q8H   Assessment: 63yo male with RLE DVT.  Pt has been on full dose Lovenox.  Now asked to transition to Apixaban.    Goal of Therapy:  Full anticoagulation with Apixaban Monitor platelets by anticoagulation protocol: Yes   Plan:  Apixaban 10mg  po BID x 7 days (1st dose to be given at time of next Lovenox dose) then Apixaban 5mg  po BID thereafter  Monitor CBC, s/sx of bleeding complications  Apixaban education  Valrie HartHall, Luke Falero A 08/15/2014,4:48 PM

## 2014-08-15 NOTE — Progress Notes (Signed)
ANTICOAGULATION CONSULT NOTE - follow up  Pharmacy Consult for Lovenox Indication: nonocclusive right lower extremity DVT.  No Known Allergies  Patient Measurements: Height: 5\' 7"  (170.2 cm) (5'7') Weight: 158 lb 15.2 oz (72.1 kg) IBW/kg (Calculated) : 66.1  Vital Signs: Temp: 98.8 F (37.1 C) (10/21 0607) Temp Source: Oral (10/21 0607) BP: 109/61 mmHg (10/21 0607) Pulse Rate: 70 (10/21 0607)  Labs:  Recent Labs  08/13/14 2126 08/14/14 0041 08/14/14 0559 08/14/14 1204 08/14/14 1531 08/14/14 1817 08/15/14 0540  HGB 9.0*  --  7.7*  --  9.0*  --  7.7*  HCT 25.6*  --  22.5*  --  25.7*  --  21.8*  PLT 137*  --  111*  --  123*  --  111*  LABPROT  --   --   --   --   --  15.5*  --   INR  --   --   --   --   --  1.22  --   CREATININE 0.92  --  0.86  --   --   --  0.98  TROPONINI <0.30 <0.30 <0.30 <0.30  --   --   --    Estimated Creatinine Clearance: 70.3 ml/min (by C-G formula based on Cr of 0.98).  Medical History: History reviewed. No pertinent past medical history.  Medications:  Scheduled:  . cyanocobalamin  1,000 mcg Intramuscular Daily  . enoxaparin (LOVENOX) injection  1 mg/kg Subcutaneous Q12H  . feeding supplement (ENSURE COMPLETE)  237 mL Oral BID BM  . folic acid  1 mg Oral Daily  . pantoprazole  40 mg Oral Daily  . piperacillin-tazobactam (ZOSYN)  IV  3.375 g Intravenous Q8H  . thiamine  100 mg Intravenous Daily  . vancomycin  750 mg Intravenous Q8H   Assessment: Okay for Protocol, pancytopenic-not profoundly so.  No prior medical care.  Heme/Onc consult pending for long term anticoag recommendations.  SQ Heparin has been stopped.  H/H and Platelets are low but appear relatively stable.  Goal of Therapy:  Anti-Xa level 0.6-1 units/ml 4hrs after LMWH dose given if clinically indicated to monitor. Full Dose Systemic Anticoagulation. Monitor platelets by anticoagulation protocol: Yes   Plan:  Lovenox 1mg /kg SQ every 12 hours. CBC daily. Baseline  PT/INR pending.  Margo AyeHall, Edgerrin Correia A 08/15/2014,10:23 AM

## 2014-08-15 NOTE — Progress Notes (Signed)
EEG could not be completed due to patients hair being so matted together.

## 2014-08-15 NOTE — Progress Notes (Signed)
Patient ID: Edwin [name], male   DOB: 12/01/1950, 63 y.o.   MRN: [id number]   [place] A. Merlene Laughter, MD     [email or web address]          Edwin [name] is an 63 y.o. male.   Assessment/Plan: 1. Multifactorial progressive encephalopathy. Etiologies includes severe nutritional deficiency, dehydration and UTI. The patient appropriately has been placed on antibiotics and is being replaced with vitamin B12 and vitamin B1. Folic acid was be added. An EEG will be obtained.   2. Questionable abnormal MRI findings. If the findings are real, I suspect that vitamin B1 deficiency is the most likely etiology. Other etiologies seem unlikely in this clinical situation.   The patient appears to be more responsive today. He does recognize me from last night.   GENERAL: He appeared malnourished and unkept.  HEENT: Supple. Atraumatic normocephalic.  ABDOMEN: soft  EXTREMITIES: No edema  BACK: Normal.  SKIN: Normal by inspection.  MENTAL STATUS: He is awake and alert. He knows that he is in the hospital in [place]. He thinks his 24. He does follow commands briskly. No dysarthria is observed. Comprehension seems good.  CRANIAL NERVES: Pupils are equal, round and reactive to light and accommodation; extra ocular movements are full, there is no significant nystagmus; visual fields are full; upper and lower facial muscles are normal in strength and symmetric, there is no flattening of the nasolabial folds; tongue is midline; uvula is midline; shoulder elevation is normal.  MOTOR: Normal tone, bulk and strength throughout.  COORDINATION: Left finger to nose is normal, right finger to nose is normal, No rest tremor; no intention tremor; no postural tremor; no bradykinesia.  REFLEXES: Deep tendon reflexes are symmetrical and normal. Babinski reflexes are flexor bilaterally.  SENSATION: Normal to light touch.     Objective: Vital signs in last 24 hours: Temp:  [98.6 F (37 C)-98.8 F  (37.1 C)] 98.8 F (37.1 C) (10/21 1329) Pulse Rate:  [66-74] 74 (10/21 1329) Resp:  [15-20] 19 (10/21 1329) BP: (101-112)/(54-61) 112/54 mmHg (10/21 1329) SpO2:  [100 %] 100 % (10/21 1329) Weight:  [72.1 kg (158 lb 15.2 oz)] 72.1 kg (158 lb 15.2 oz) (10/21 0160)  Intake/Output from previous day: 10/20 0701 - 10/21 0700 In: 1597.3 [P.O.:240; I.V.:1107.3; IV Piggyback:250] Out: 400 [Urine:400] Intake/Output this shift: Total I/O In: 710 [P.O.:710] Out: 875 [Urine:875] Nutritional status: Dysphagia   Lab Results: Results for orders placed during the hospital encounter of 08/13/14 (from the past 48 hour(s))  CBC WITH DIFFERENTIAL     Status: Abnormal   Collection Time    08/13/14  9:26 PM      Result Value Ref Range   WBC 4.2  4.0 - 10.5 K/uL   RBC 2.23 (*) 4.22 - 5.81 MIL/uL   Hemoglobin 9.0 (*) 13.0 - 17.0 g/dL   HCT 25.6 (*) 39.0 - 52.0 %   MCV 114.8 (*) 78.0 - 100.0 fL   MCH 40.4 (*) 26.0 - 34.0 pg   MCHC 35.2  30.0 - 36.0 g/dL   RDW 19.4 (*) 11.5 - 15.5 %   Platelets 137 (*) 150 - 400 K/uL   Neutrophils Relative % 62  43 - 77 %   Lymphocytes Relative 36  12 - 46 %   Monocytes Relative 1 (*) 3 - 12 %   Eosinophils Relative 1  0 - 5 %   Basophils Relative 0  0 - 1 %   Neutro Abs 2.7  1.7 - 7.7 K/uL  Lymphs Abs 1.5  0.7 - 4.0 K/uL   Monocytes Absolute 0.0 (*) 0.1 - 1.0 K/uL   Eosinophils Absolute 0.0  0.0 - 0.7 K/uL   Basophils Absolute 0.0  0.0 - 0.1 K/uL   RBC Morphology RARE NRBCs     Comment: ELLIPTOCYTES     TEARDROP CELLS   WBC Morphology TOXIC GRANULATION    COMPREHENSIVE METABOLIC PANEL     Status: Abnormal   Collection Time    08/13/14  9:26 PM      Result Value Ref Range   Sodium 140  137 - 147 mEq/L   Potassium 4.0  3.7 - 5.3 mEq/L   Chloride 100  96 - 112 mEq/L   CO2 25  19 - 32 mEq/L   Glucose, Bld 83  70 - 99 mg/dL   BUN 32 (*) 6 - 23 mg/dL   Creatinine, Ser 0.92  0.50 - 1.35 mg/dL   Calcium 9.4  8.4 - 10.5 mg/dL   Total Protein 7.1  6.0 - 8.3  g/dL   Albumin 3.7  3.5 - 5.2 g/dL   AST 14  0 - 37 U/L   ALT 7  0 - 53 U/L   Alkaline Phosphatase 49  39 - 117 U/L   Total Bilirubin 2.2 (*) 0.3 - 1.2 mg/dL   GFR calc non Af Amer 87 (*) >90 mL/min   GFR calc Af Amer >90  >90 mL/min   Comment: (NOTE)     The eGFR has been calculated using the CKD EPI equation.     This calculation has not been validated in all clinical situations.     eGFR's persistently <90 mL/min signify possible Chronic Kidney     Disease.   Anion gap 15  5 - 15  TROPONIN I     Status: None   Collection Time    08/13/14  9:26 PM      Result Value Ref Range   Troponin I <0.30  <0.30 ng/mL   Comment:            Due to the release kinetics of cTnI,     a negative result within the first hours     of the onset of symptoms does not rule out     myocardial infarction with certainty.     If myocardial infarction is still suspected,     repeat the test at appropriate intervals.  PRO B NATRIURETIC PEPTIDE     Status: None   Collection Time    08/13/14 10:24 PM      Result Value Ref Range   Pro B Natriuretic peptide (BNP) 82.8  0 - 125 pg/mL  URINALYSIS, ROUTINE W REFLEX MICROSCOPIC     Status: Abnormal   Collection Time    08/13/14 11:02 PM      Result Value Ref Range   Color, Urine AMBER (*) YELLOW   Comment: BIOCHEMICALS MAY BE AFFECTED BY COLOR   APPearance CLOUDY (*) CLEAR   Specific Gravity, Urine 1.020  1.005 - 1.030   pH 8.5 (*) 5.0 - 8.0   Glucose, UA NEGATIVE  NEGATIVE mg/dL   Hgb urine dipstick LARGE (*) NEGATIVE   Bilirubin Urine MODERATE (*) NEGATIVE   Ketones, ur 40 (*) NEGATIVE mg/dL   Protein, ur >300 (*) NEGATIVE mg/dL   Urobilinogen, UA 1.0  0.0 - 1.0 mg/dL   Nitrite NEGATIVE  NEGATIVE   Leukocytes, UA MODERATE (*) NEGATIVE  URINE MICROSCOPIC-ADD ON     Status:  Abnormal   Collection Time    08/13/14 11:02 PM      Result Value Ref Range   Squamous Epithelial / LPF RARE  RARE   WBC, UA 21-50  <3 WBC/hpf   RBC / HPF 11-20  <3 RBC/hpf    Bacteria, UA MANY (*) RARE   Crystals TRIPLE PHOSPHATE CRYSTALS (*) NEGATIVE  URINE CULTURE     Status: None   Collection Time    08/13/14 11:02 PM      Result Value Ref Range   Specimen Description URINE, CATHETERIZED     Special Requests NONE     Culture  Setup Time       Value: 08/14/2014 13:59     Performed at [facility]       Value: NO GROWTH     Performed at [facility]   Culture       Value: NO GROWTH     Performed at [facility]   Report Status 08/15/2014 FINAL    URINE RAPID DRUG SCREEN (HOSP PERFORMED)     Status: None   Collection Time    08/13/14 11:02 PM      Result Value Ref Range   Opiates NONE DETECTED  NONE DETECTED   Cocaine NONE DETECTED  NONE DETECTED   Benzodiazepines NONE DETECTED  NONE DETECTED   Amphetamines NONE DETECTED  NONE DETECTED   Tetrahydrocannabinol NONE DETECTED  NONE DETECTED   Barbiturates NONE DETECTED  NONE DETECTED   Comment:            DRUG SCREEN FOR MEDICAL PURPOSES     ONLY.  IF CONFIRMATION IS NEEDED     FOR ANY PURPOSE, NOTIFY LAB     WITHIN 5 DAYS.                LOWEST DETECTABLE LIMITS     FOR URINE DRUG SCREEN     Drug Class       Cutoff (ng/mL)     Amphetamine      1000     Barbiturate      200     Benzodiazepine   403     Tricyclics       474     Opiates          300     Cocaine          300     THC              50  TSH     Status: None   Collection Time    08/14/14 12:06 AM      Result Value Ref Range   TSH 0.802  0.350 - 4.500 uIU/mL   Comment: Performed at [place]     Status: None   Collection Time    08/14/14 12:08 AM      Result Value Ref Range   Ammonia 21  11 - 60 umol/L  CULTURE, BLOOD (ROUTINE X 2)     Status: None   Collection Time    08/14/14 12:13 AM      Result Value Ref Range   Specimen Description BLOOD LEFT ANTECUBITAL     Special Requests       Value: BOTTLES DRAWN AEROBIC AND ANAEROBIC AEB=2CC ANA=6CC   Culture NO GROWTH 1  DAY     Report Status PENDING    CULTURE, BLOOD (ROUTINE X 2)  Status: None   Collection Time    08/14/14 12:13 AM      Result Value Ref Range   Specimen Description BLOOD LEFT HAND     Special Requests       Value: BOTTLES DRAWN AEROBIC AND ANAEROBIC AEB=6CC ANA=2CC   Culture NO GROWTH 1 DAY     Report Status PENDING    PREALBUMIN     Status: Abnormal   Collection Time    08/14/14 12:25 AM      Result Value Ref Range   Prealbumin 7.0 (*) 17.0 - 34.0 mg/dL   Comment: Performed at [facility]  TROPONIN I     Status: None   Collection Time    08/14/14 12:41 AM      Result Value Ref Range   Troponin I <0.30  <0.30 ng/mL   Comment:            Due to the release kinetics of cTnI,     a negative result within the first hours     of the onset of symptoms does not rule out     myocardial infarction with certainty.     If myocardial infarction is still suspected,     repeat the test at appropriate intervals.  HEMOGLOBIN A1C     Status: None   Collection Time    08/14/14 12:41 AM      Result Value Ref Range   Hemoglobin A1C 5.6  <5.7 %   Comment: (NOTE)                                                                               According to the ADA Clinical Practice Recommendations for 2011, when     HbA1c is used as a screening test:      >=6.5%   Diagnostic of Diabetes Mellitus               (if abnormal result is confirmed)     5.7-6.4%   Increased risk of developing Diabetes Mellitus     References:Diagnosis and Classification of Diabetes Mellitus,Diabetes     LKGM,0102,72(ZDGUY 1):S62-S69 and Standards of Medical Care in             Diabetes - 2011,Diabetes Care,2011,34 (Suppl 1):S11-S61.   Mean Plasma Glucose 114  <117 mg/dL   Comment: Performed at [place] D 25 HYDROXY     Status: Abnormal   Collection Time    08/14/14 12:41 AM      Result Value Ref Range   Vit D, 25-Hydroxy <10 (*) 30 - 89 ng/mL   Comment: (NOTE)     This assay  accurately quantifies Vitamin D, which is the sum of the     25-Hydroxy forms of Vitamin D2 and D3.  Studies have shown that the     optimum concentration of 25-Hydroxy Vitamin D is 30 ng/mL or higher.      Concentrations of Vitamin D between 20 and 29 ng/mL are considered to     be insufficient and concentrations less than 20 ng/mL are considered     to be deficient for Vitamin D.     Performed at [facility]  VITAMIN B12     Status: Abnormal   Collection Time    08/14/14 12:41 AM      Result Value Ref Range   Vitamin B-12 88 (*) 211 - 911 pg/mL   Comment: Performed at [facility]     Status: None   Collection Time    08/14/14 12:41 AM      Result Value Ref Range   Folate >20.0     Comment: (NOTE)     Reference Ranges            Deficient:       0.4 - 3.3 ng/mL            Indeterminate:   3.4 - 5.4 ng/mL            Normal:              > 5.4 ng/mL     Performed at [facility]     Status: Abnormal   Collection Time    08/14/14 12:41 AM      Result Value Ref Range   Iron 50  42 - 135 ug/dL   TIBC 187 (*) 215 - 435 ug/dL   Saturation Ratios 27  20 - 55 %   UIBC 137  125 - 400 ug/dL   Comment: Performed at [place]     Status: None   Collection Time    08/14/14 12:41 AM      Result Value Ref Range   Ferritin 254  22 - 322 ng/mL   Comment: Performed at [place]     Status: Abnormal   Collection Time    08/14/14 12:41 AM      Result Value Ref Range   Retic Ct Pct 0.6  0.4 - 3.1 %   RBC. 2.02 (*) 4.22 - 5.81 MIL/uL   Retic Count, Manual 12.1 (*) 19.0 - 186.0 K/uL  PSA     Status: None   Collection Time    08/14/14 12:41 AM      Result Value Ref Range   PSA 0.19  <=4.00 ng/mL   Comment: (NOTE)     Test Methodology: ECLIA PSA (Electrochemiluminescence Immunoassay)     For PSA values from 2.5-4.0, particularly in younger men <60 years     old, the AUA and NCCN suggest  testing for % Free PSA (3515) and     evaluation of the rate of increase in PSA (PSA velocity).     Performed at [facility]     Status: Abnormal   Collection Time    08/14/14 12:41 AM      Result Value Ref Range   PSA, Free <0.1     PSA, Free Pct 16 (*) >25 %   Comment: (NOTE)                         Probability of Prostate Cancer      (For Men with Non-Suspicious DRE Results and PSA Between 4 and      10 ng/mL, By Patient Age)      % free PSA                          Patient Age  50 to 59 Years  60 to 61 Years  >70 Years       <=10%                  49.2%           57.5%          64.5%       11 - 18%               26.9%           33.9%          40.8%       19 - 25%               18.3%           23.9%          29.7%       >25%                    9.1%           12.2%          15.8%     Performed at [facility]     Status: Abnormal   Collection Time    08/14/14  5:59 AM      Result Value Ref Range   Sodium 140  137 - 147 mEq/L   Potassium 4.1  3.7 - 5.3 mEq/L   Chloride 103  96 - 112 mEq/L   CO2 26  19 - 32 mEq/L   Glucose, Bld 80  70 - 99 mg/dL   BUN 29 (*) 6 - 23 mg/dL   Creatinine, Ser 0.86  0.50 - 1.35 mg/dL   Calcium 8.4  8.4 - 10.5 mg/dL   Total Protein 6.0  6.0 - 8.3 g/dL   Albumin 3.1 (*) 3.5 - 5.2 g/dL   AST 13  0 - 37 U/L   ALT 6  0 - 53 U/L   Alkaline Phosphatase 42  39 - 117 U/L   Total Bilirubin 1.7 (*) 0.3 - 1.2 mg/dL   GFR calc non Af Amer 89 (*) >90 mL/min   GFR calc Af Amer >90  >90 mL/min   Comment: (NOTE)     The eGFR has been calculated using the CKD EPI equation.     This calculation has not been validated in all clinical situations.     eGFR's persistently <90 mL/min signify possible Chronic Kidney     Disease.   Anion gap 11  5 - 15  CBC WITH DIFFERENTIAL     Status: Abnormal   Collection Time    08/14/14  5:59 AM      Result Value Ref Range   WBC 3.1 (*) 4.0 -  10.5 K/uL   RBC 1.95 (*) 4.22 - 5.81 MIL/uL   Hemoglobin 7.7 (*) 13.0 - 17.0 g/dL   HCT 22.5 (*) 39.0 - 52.0 %   MCV 115.4 (*) 78.0 - 100.0 fL   MCH 39.5 (*) 26.0 - 34.0 pg   MCHC 34.2  30.0 - 36.0 g/dL   RDW 19.3 (*) 11.5 - 15.5 %   Platelets 111 (*) 150 - 400 K/uL   Comment: SPECIMEN CHECKED FOR CLOTS     PLATELET COUNT CONFIRMED BY SMEAR   Neutrophils Relative % 47  43 - 77 %   Neutro Abs 1.4 (*) 1.7 - 7.7 K/uL   Lymphocytes Relative 49 (*)  12 - 46 %   Lymphs Abs 1.5  0.7 - 4.0 K/uL   Monocytes Relative 2 (*) 3 - 12 %   Monocytes Absolute 0.1  0.1 - 1.0 K/uL   Eosinophils Relative 2  0 - 5 %   Eosinophils Absolute 0.1  0.0 - 0.7 K/uL   Basophils Relative 0  0 - 1 %   Basophils Absolute 0.0  0.0 - 0.1 K/uL  TROPONIN I     Status: None   Collection Time    08/14/14  5:59 AM      Result Value Ref Range   Troponin I <0.30  <0.30 ng/mL   Comment:            Due to the release kinetics of cTnI,     a negative result within the first hours     of the onset of symptoms does not rule out     myocardial infarction with certainty.     If myocardial infarction is still suspected,     repeat the test at appropriate intervals.  LIPID PANEL     Status: Abnormal   Collection Time    08/14/14  5:59 AM      Result Value Ref Range   Cholesterol 111  0 - 200 mg/dL   Triglycerides 104  <150 mg/dL   HDL 18 (*) >39 mg/dL   Total CHOL/HDL Ratio 6.2     VLDL 21  0 - 40 mg/dL   LDL Cholesterol 72  0 - 99 mg/dL   Comment:            Total Cholesterol/HDL:CHD Risk     Coronary Heart Disease Risk Table                         Men   Women      1/2 Average Risk   3.4   3.3      Average Risk       5.0   4.4      2 X Average Risk   9.6   7.1      3 X Average Risk  23.4   11.0                Use the calculated Patient Ratio     above and the CHD Risk Table     to determine the patient's CHD Risk.                ATP III CLASSIFICATION (LDL):      <100     mg/dL   Optimal      100-129  mg/dL    Near or Above                        Optimal      130-159  mg/dL   Borderline      160-189  mg/dL   High      >190     mg/dL   Very High  TROPONIN I     Status: None   Collection Time    08/14/14 12:04 PM      Result Value Ref Range   Troponin I <0.30  <0.30 ng/mL   Comment:            Due to the release kinetics of cTnI,     a negative result within the first hours     of the onset  of symptoms does not rule out     myocardial infarction with certainty.     If myocardial infarction is still suspected,     repeat the test at appropriate intervals.  RPR     Status: None   Collection Time    08/14/14 12:04 PM      Result Value Ref Range   RPR NON REAC  NON REAC   Comment: Performed at [facility]  CBC     Status: Abnormal   Collection Time    08/14/14  3:31 PM      Result Value Ref Range   WBC 3.4 (*) 4.0 - 10.5 K/uL   RBC 2.21 (*) 4.22 - 5.81 MIL/uL   Hemoglobin 9.0 (*) 13.0 - 17.0 g/dL   HCT 25.7 (*) 39.0 - 52.0 %   MCV 116.3 (*) 78.0 - 100.0 fL   MCH 40.7 (*) 26.0 - 34.0 pg   MCHC 35.0  30.0 - 36.0 g/dL   RDW 19.2 (*) 11.5 - 15.5 %   Platelets 123 (*) 150 - 400 K/uL  PROTIME-INR     Status: Abnormal   Collection Time    08/14/14  6:17 PM      Result Value Ref Range   Prothrombin Time 15.5 (*) 11.6 - 15.2 seconds   INR 1.22  0.00 - 1.49  CBC WITH DIFFERENTIAL     Status: Abnormal   Collection Time    08/15/14  5:40 AM      Result Value Ref Range   WBC 3.1 (*) 4.0 - 10.5 K/uL   RBC 1.91 (*) 4.22 - 5.81 MIL/uL   Hemoglobin 7.7 (*) 13.0 - 17.0 g/dL   HCT 21.8 (*) 39.0 - 52.0 %   MCV 114.1 (*) 78.0 - 100.0 fL   MCH 40.3 (*) 26.0 - 34.0 pg   MCHC 35.3  30.0 - 36.0 g/dL   RDW 19.0 (*) 11.5 - 15.5 %   Platelets 111 (*) 150 - 400 K/uL   Comment: CONSISTENT WITH PREVIOUS RESULT     SPECIMEN CHECKED FOR CLOTS   Neutrophils Relative % 57  43 - 77 %   Neutro Abs 1.8  1.7 - 7.7 K/uL   Lymphocytes Relative 38  12 - 46 %   Lymphs Abs 1.2  0.7 - 4.0 K/uL    Monocytes Relative 2 (*) 3 - 12 %   Monocytes Absolute 0.1  0.1 - 1.0 K/uL   Eosinophils Relative 2  0 - 5 %   Eosinophils Absolute 0.1  0.0 - 0.7 K/uL   Basophils Relative 0  0 - 1 %   Basophils Absolute 0.0  0.0 - 0.1 K/uL  COMPREHENSIVE METABOLIC PANEL     Status: Abnormal   Collection Time    08/15/14  5:40 AM      Result Value Ref Range   Sodium 139  137 - 147 mEq/L   Potassium 3.6 (*) 3.7 - 5.3 mEq/L   Chloride 104  96 - 112 mEq/L   CO2 26  19 - 32 mEq/L   Glucose, Bld 91  70 - 99 mg/dL   BUN 20  6 - 23 mg/dL   Creatinine, Ser 0.98  0.50 - 1.35 mg/dL   Calcium 8.5  8.4 - 10.5 mg/dL   Total Protein 5.7 (*) 6.0 - 8.3 g/dL   Albumin 3.0 (*) 3.5 - 5.2 g/dL   AST 12  0 - 37 U/L   ALT 6  0 - 53  U/L   Alkaline Phosphatase 37 (*) 39 - 117 U/L   Total Bilirubin 1.2  0.3 - 1.2 mg/dL   GFR calc non Af Amer 84 (*) >90 mL/min   GFR calc Af Amer >90  >90 mL/min   Comment: (NOTE)     The eGFR has been calculated using the CKD EPI equation.     This calculation has not been validated in all clinical situations.     eGFR's persistently <90 mL/min signify possible Chronic Kidney     Disease.   Anion gap 9  5 - 15    Lipid Panel  Recent Labs  08/14/14 0559  CHOL 111  TRIG 104  HDL 18*  CHOLHDL 6.2  VLDL 21  LDLCALC 72    Studies/Results:   Medications:  Scheduled Meds: . apixaban  10 mg Oral BID  . [START ON 08/22/2014] apixaban  5 mg Oral BID  . cyanocobalamin  1,000 mcg Intramuscular Daily  . feeding supplement (ENSURE COMPLETE)  237 mL Oral BID BM  . folic acid  1 mg Oral Daily  . pantoprazole  40 mg Oral Daily  . piperacillin-tazobactam (ZOSYN)  IV  3.375 g Intravenous Q8H  . thiamine  100 mg Intravenous Daily  . vancomycin  750 mg Intravenous Q8H   Continuous Infusions: . sodium chloride 70 mL/hr at 08/15/14 0624   PRN Meds:.     LOS: 2 days   Jonel Weldon A. Merlene Laughter, M.D.  Diplomate, Tax adviser of Psychiatry and Neurology ( Neurology).

## 2014-08-15 NOTE — Consult Note (Signed)
[facility] Consultation Oncology  Name: Elad Macphail      MRN: [id number]    Location: G387/F643-32  Date: 08/15/2014 Time:4:41 PM   REFERRING PHYSICIAN:  Rexene Alberts, MD  REASON FOR CONSULT:  Tx non occlusive RLE DVT.   DIAGNOSIS:  Right lower extremity nonocclusive thrombus from the right common femoral vein to the right superficial femoral vein  HISTORY OF PRESENT ILLNESS:   Mr. Marvis Moeller is a 63 yo man with a past medical history significant for failure to thrive, and malnutrition who presented to the [facility] ED on 08/13/2014 with failure to thrive via EMS at the request of the patient's son.    Work-up in the ED revealed dehydration with RJJ:[id number] ratio greater than 20, macrocytic anemia, mild thrombocytopenia, and UA suspicious of infection.  He was subsequently admitted to the hospital.  In the hospital further work-up revealed a B12 deficiency, unimpressive iron studies, normal folate, PSA WNL, Vit D deficiency, and pancytopenia.  I personally reviewed and went over laboratory results with the patient.  The results are noted within this dictation.  I personally reviewed and went over radiographic studies with the patient.  The results are noted within this dictation.  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  [place] of right LE demonstrated a non-occlusive DVT, and a negative V/Q scan.  Chart reviewed  Hematology was consulted for treatment options for anticoagulation, but we would also like to involve ourselves in the patient's pancytopenia.  Patient admits that his right LE is swollen, but denies any pain.  He reports that he lives alone and is having a difficult time caring for himself at home.  He mentions his son and it sounds like his son helps him.  He denies any blood loss, blood in stool, black tarry stool, hematuria, and hemoptysis.  He denies any chest or abdominal pain.  He is slow to answer questions.  He denies any EtOH and tobacco abuse.    PAST  MEDICAL HISTORY:   History reviewed. No pertinent past medical history.  ALLERGIES: No Known Allergies    MEDICATIONS: I have reviewed the patient's current medications.     PAST SURGICAL HISTORY History reviewed. No pertinent past surgical history.  FAMILY HISTORY: No family history on file.  SOCIAL HISTORY:  reports that he has never smoked. He uses smokeless tobacco. He reports that he does not drink alcohol. His drug history is not on file.  PERFORMANCE STATUS: The patient's performance status is 2 - Symptomatic, <50% confined to bed  PHYSICAL EXAM: Most Recent Vital Signs: Blood pressure 112/54, pulse 74, temperature 98.8 F (37.1 C), temperature source Oral, resp. rate 19, height 5' 7"  (1.702 m), weight 158 lb 15.2 oz (72.1 kg), SpO2 100.00%. General appearance: alert, cooperative, appears older than stated age, no distress and slowed mentation Head: Normocephalic, without obvious abnormality, atraumatic Eyes: negative findings: lids and lashes normal, conjunctivae and sclerae normal and corneas clear Neck: supple, symmetrical, trachea midline Lungs: clear to auscultation bilaterally Heart: regular rate and rhythm Abdomen: soft, non-tender; bowel sounds normal; no masses,  no organomegaly Extremities: Right LE edema with erythema and heat.  Nontender. Neurologic: Mental status: affect: blunted  LABORATORY DATA:  Results for orders placed during the hospital encounter of 08/13/14 (from the past 48 hour(s))  CBC WITH DIFFERENTIAL     Status: Abnormal   Collection Time    08/13/14  9:26 PM      Result Value Ref Range   WBC 4.2  4.0 -  10.5 K/uL   RBC 2.23 (*) 4.22 - 5.81 MIL/uL   Hemoglobin 9.0 (*) 13.0 - 17.0 g/dL   HCT 25.6 (*) 39.0 - 52.0 %   MCV 114.8 (*) 78.0 - 100.0 fL   MCH 40.4 (*) 26.0 - 34.0 pg   MCHC 35.2  30.0 - 36.0 g/dL   RDW 19.4 (*) 11.5 - 15.5 %   Platelets 137 (*) 150 - 400 K/uL   Neutrophils Relative % 62  43 - 77 %   Lymphocytes Relative 36  12 -  46 %   Monocytes Relative 1 (*) 3 - 12 %   Eosinophils Relative 1  0 - 5 %   Basophils Relative 0  0 - 1 %   Neutro Abs 2.7  1.7 - 7.7 K/uL   Lymphs Abs 1.5  0.7 - 4.0 K/uL   Monocytes Absolute 0.0 (*) 0.1 - 1.0 K/uL   Eosinophils Absolute 0.0  0.0 - 0.7 K/uL   Basophils Absolute 0.0  0.0 - 0.1 K/uL   RBC Morphology RARE NRBCs     Comment: ELLIPTOCYTES     TEARDROP CELLS   WBC Morphology TOXIC GRANULATION    COMPREHENSIVE METABOLIC PANEL     Status: Abnormal   Collection Time    08/13/14  9:26 PM      Result Value Ref Range   Sodium 140  137 - 147 mEq/L   Potassium 4.0  3.7 - 5.3 mEq/L   Chloride 100  96 - 112 mEq/L   CO2 25  19 - 32 mEq/L   Glucose, Bld 83  70 - 99 mg/dL   BUN 32 (*) 6 - 23 mg/dL   Creatinine, Ser 0.92  0.50 - 1.35 mg/dL   Calcium 9.4  8.4 - 10.5 mg/dL   Total Protein 7.1  6.0 - 8.3 g/dL   Albumin 3.7  3.5 - 5.2 g/dL   AST 14  0 - 37 U/L   ALT 7  0 - 53 U/L   Alkaline Phosphatase 49  39 - 117 U/L   Total Bilirubin 2.2 (*) 0.3 - 1.2 mg/dL   GFR calc non Af Amer 87 (*) >90 mL/min   GFR calc Af Amer >90  >90 mL/min   Comment: (NOTE)     The eGFR has been calculated using the CKD EPI equation.     This calculation has not been validated in all clinical situations.     eGFR's persistently <90 mL/min signify possible Chronic Kidney     Disease.   Anion gap 15  5 - 15  TROPONIN I     Status: None   Collection Time    08/13/14  9:26 PM      Result Value Ref Range   Troponin I <0.30  <0.30 ng/mL   Comment:            Due to the release kinetics of cTnI,     a negative result within the first hours     of the onset of symptoms does not rule out     myocardial infarction with certainty.     If myocardial infarction is still suspected,     repeat the test at appropriate intervals.  PRO B NATRIURETIC PEPTIDE     Status: None   Collection Time    08/13/14 10:24 PM      Result Value Ref Range   Pro B Natriuretic peptide (BNP) 82.8  0 - 125 pg/mL  URINALYSIS,  ROUTINE W  REFLEX MICROSCOPIC     Status: Abnormal   Collection Time    08/13/14 11:02 PM      Result Value Ref Range   Color, Urine AMBER (*) YELLOW   Comment: BIOCHEMICALS MAY BE AFFECTED BY COLOR   APPearance CLOUDY (*) CLEAR   Specific Gravity, Urine 1.020  1.005 - 1.030   pH 8.5 (*) 5.0 - 8.0   Glucose, UA NEGATIVE  NEGATIVE mg/dL   Hgb urine dipstick LARGE (*) NEGATIVE   Bilirubin Urine MODERATE (*) NEGATIVE   Ketones, ur 40 (*) NEGATIVE mg/dL   Protein, ur >300 (*) NEGATIVE mg/dL   Urobilinogen, UA 1.0  0.0 - 1.0 mg/dL   Nitrite NEGATIVE  NEGATIVE   Leukocytes, UA MODERATE (*) NEGATIVE  URINE MICROSCOPIC-ADD ON     Status: Abnormal   Collection Time    08/13/14 11:02 PM      Result Value Ref Range   Squamous Epithelial / LPF RARE  RARE   WBC, UA 21-50  <3 WBC/hpf   RBC / HPF 11-20  <3 RBC/hpf   Bacteria, UA MANY (*) RARE   Crystals TRIPLE PHOSPHATE CRYSTALS (*) NEGATIVE  URINE CULTURE     Status: None   Collection Time    08/13/14 11:02 PM      Result Value Ref Range   Specimen Description URINE, CATHETERIZED     Special Requests NONE     Culture  Setup Time       Value: 08/14/2014 13:59     Performed at [facility]       Value: NO GROWTH     Performed at [facility]   Culture       Value: NO GROWTH     Performed at [facility]   Report Status 08/15/2014 FINAL    URINE RAPID DRUG SCREEN (HOSP PERFORMED)     Status: None   Collection Time    08/13/14 11:02 PM      Result Value Ref Range   Opiates NONE DETECTED  NONE DETECTED   Cocaine NONE DETECTED  NONE DETECTED   Benzodiazepines NONE DETECTED  NONE DETECTED   Amphetamines NONE DETECTED  NONE DETECTED   Tetrahydrocannabinol NONE DETECTED  NONE DETECTED   Barbiturates NONE DETECTED  NONE DETECTED   Comment:            DRUG SCREEN FOR MEDICAL PURPOSES     ONLY.  IF CONFIRMATION IS NEEDED     FOR ANY PURPOSE, NOTIFY LAB     WITHIN 5 DAYS.                LOWEST  DETECTABLE LIMITS     FOR URINE DRUG SCREEN     Drug Class       Cutoff (ng/mL)     Amphetamine      1000     Barbiturate      200     Benzodiazepine   270     Tricyclics       350     Opiates          300     Cocaine          300     THC              50  TSH     Status: None   Collection Time    08/14/14 12:06 AM      Result Value Ref  Range   TSH 0.802  0.350 - 4.500 uIU/mL   Comment: Performed at [place]     Status: None   Collection Time    08/14/14 12:08 AM      Result Value Ref Range   Ammonia 21  11 - 60 umol/L  CULTURE, BLOOD (ROUTINE X 2)     Status: None   Collection Time    08/14/14 12:13 AM      Result Value Ref Range   Specimen Description BLOOD LEFT ANTECUBITAL     Special Requests       Value: BOTTLES DRAWN AEROBIC AND ANAEROBIC AEB=2CC ANA=6CC   Culture NO GROWTH 1 DAY     Report Status PENDING    CULTURE, BLOOD (ROUTINE X 2)     Status: None   Collection Time    08/14/14 12:13 AM      Result Value Ref Range   Specimen Description BLOOD LEFT HAND     Special Requests       Value: BOTTLES DRAWN AEROBIC AND ANAEROBIC AEB=6CC ANA=2CC   Culture NO GROWTH 1 DAY     Report Status PENDING    PREALBUMIN     Status: Abnormal   Collection Time    08/14/14 12:25 AM      Result Value Ref Range   Prealbumin 7.0 (*) 17.0 - 34.0 mg/dL   Comment: Performed at [facility]  TROPONIN I     Status: None   Collection Time    08/14/14 12:41 AM      Result Value Ref Range   Troponin I <0.30  <0.30 ng/mL   Comment:            Due to the release kinetics of cTnI,     a negative result within the first hours     of the onset of symptoms does not rule out     myocardial infarction with certainty.     If myocardial infarction is still suspected,     repeat the test at appropriate intervals.  HEMOGLOBIN A1C     Status: None   Collection Time    08/14/14 12:41 AM      Result Value Ref Range   Hemoglobin A1C 5.6  <5.7 %   Comment: (NOTE)                                                                                According to the ADA Clinical Practice Recommendations for 2011, when     HbA1c is used as a screening test:      >=6.5%   Diagnostic of Diabetes Mellitus               (if abnormal result is confirmed)     5.7-6.4%   Increased risk of developing Diabetes Mellitus     References:Diagnosis and Classification of Diabetes Mellitus,Diabetes     LKGM,0102,72(ZDGUY 1):S62-S69 and Standards of Medical Care in             Diabetes - 2011,Diabetes Care,2011,34 (Suppl 1):S11-S61.   Mean Plasma Glucose 114  <117 mg/dL   Comment: Performed at [facility]  HYDROXY     Status: Abnormal   Collection Time    08/14/14 12:41 AM      Result Value Ref Range   Vit D, 25-Hydroxy <10 (*) 30 - 89 ng/mL   Comment: (NOTE)     This assay accurately quantifies Vitamin D, which is the sum of the     25-Hydroxy forms of Vitamin D2 and D3.  Studies have shown that the     optimum concentration of 25-Hydroxy Vitamin D is 30 ng/mL or higher.      Concentrations of Vitamin D between 20 and 29 ng/mL are considered to     be insufficient and concentrations less than 20 ng/mL are considered     to be deficient for Vitamin D.     Performed at [place] [facility]     Status: Abnormal   Collection Time    08/14/14 12:41 AM      Result Value Ref Range   Vitamin B-12 88 (*) 211 - 911 pg/mL   Comment: Performed at [place] [facility]     Status: None   Collection Time    08/14/14 12:41 AM      Result Value Ref Range   Folate >20.0     Comment: (NOTE)     Reference Ranges            Deficient:       0.4 - 3.3 ng/mL            Indeterminate:   3.4 - 5.4 ng/mL            Normal:              > 5.4 ng/mL     Performed at [facility]     Status: Abnormal   Collection Time    08/14/14 12:41 AM      Result Value Ref Range   Iron 50  42 - 135 ug/dL   TIBC 187 (*) 215 - 435 ug/dL    Saturation Ratios 27  20 - 55 %   UIBC 137  125 - 400 ug/dL   Comment: Performed at [place]     Status: None   Collection Time    08/14/14 12:41 AM      Result Value Ref Range   Ferritin 254  22 - 322 ng/mL   Comment: Performed at [place]     Status: Abnormal   Collection Time    08/14/14 12:41 AM      Result Value Ref Range   Retic Ct Pct 0.6  0.4 - 3.1 %   RBC. 2.02 (*) 4.22 - 5.81 MIL/uL   Retic Count, Manual 12.1 (*) 19.0 - 186.0 K/uL  PSA     Status: None   Collection Time    08/14/14 12:41 AM      Result Value Ref Range   PSA 0.19  <=4.00 ng/mL   Comment: (NOTE)     Test Methodology: ECLIA PSA (Electrochemiluminescence Immunoassay)     For PSA values from 2.5-4.0, particularly in younger men <60 years     old, the AUA and NCCN suggest testing for % Free PSA (3515) and     evaluation of the rate of increase in PSA (PSA velocity).     Performed at [facility]     Status: Abnormal   Collection Time  08/14/14 12:41 AM      Result Value Ref Range   PSA, Free <0.1     PSA, Free Pct 16 (*) >25 %   Comment: (NOTE)                         Probability of Prostate Cancer      (For Men with Non-Suspicious DRE Results and PSA Between 4 and      10 ng/mL, By Patient Age)      % free PSA                          Patient Age                           18 to 38 Years  51 to 18 Years  >70 Years       <=10%                  49.2%           57.5%          64.5%       11 - 18%               26.9%           33.9%          40.8%       19 - 25%               18.3%           23.9%          29.7%       >25%                    9.1%           12.2%          15.8%     Performed at [place]     Status: Abnormal   Collection Time    08/14/14  5:59 AM      Result Value Ref Range   Sodium 140  137 - 147 mEq/L   Potassium 4.1  3.7 - 5.3 mEq/L   Chloride 103  96 - 112 mEq/L   CO2  26  19 - 32 mEq/L   Glucose, Bld 80  70 - 99 mg/dL   BUN 29 (*) 6 - 23 mg/dL   Creatinine, Ser 0.86  0.50 - 1.35 mg/dL   Calcium 8.4  8.4 - 10.5 mg/dL   Total Protein 6.0  6.0 - 8.3 g/dL   Albumin 3.1 (*) 3.5 - 5.2 g/dL   AST 13  0 - 37 U/L   ALT 6  0 - 53 U/L   Alkaline Phosphatase 42  39 - 117 U/L   Total Bilirubin 1.7 (*) 0.3 - 1.2 mg/dL   GFR calc non Af Amer 89 (*) >90 mL/min   GFR calc Af Amer >90  >90 mL/min   Comment: (NOTE)     The eGFR has been calculated using the CKD EPI equation.     This calculation has not been validated in all clinical situations.     eGFR's persistently <90 mL/min signify possible Chronic Kidney     Disease.   Anion gap 11  5 - 15  CBC WITH DIFFERENTIAL  Status: Abnormal   Collection Time    08/14/14  5:59 AM      Result Value Ref Range   WBC 3.1 (*) 4.0 - 10.5 K/uL   RBC 1.95 (*) 4.22 - 5.81 MIL/uL   Hemoglobin 7.7 (*) 13.0 - 17.0 g/dL   HCT 22.5 (*) 39.0 - 52.0 %   MCV 115.4 (*) 78.0 - 100.0 fL   MCH 39.5 (*) 26.0 - 34.0 pg   MCHC 34.2  30.0 - 36.0 g/dL   RDW 19.3 (*) 11.5 - 15.5 %   Platelets 111 (*) 150 - 400 K/uL   Comment: SPECIMEN CHECKED FOR CLOTS     PLATELET COUNT CONFIRMED BY SMEAR   Neutrophils Relative % 47  43 - 77 %   Neutro Abs 1.4 (*) 1.7 - 7.7 K/uL   Lymphocytes Relative 49 (*) 12 - 46 %   Lymphs Abs 1.5  0.7 - 4.0 K/uL   Monocytes Relative 2 (*) 3 - 12 %   Monocytes Absolute 0.1  0.1 - 1.0 K/uL   Eosinophils Relative 2  0 - 5 %   Eosinophils Absolute 0.1  0.0 - 0.7 K/uL   Basophils Relative 0  0 - 1 %   Basophils Absolute 0.0  0.0 - 0.1 K/uL  TROPONIN I     Status: None   Collection Time    08/14/14  5:59 AM      Result Value Ref Range   Troponin I <0.30  <0.30 ng/mL   Comment:            Due to the release kinetics of cTnI,     a negative result within the first hours     of the onset of symptoms does not rule out     myocardial infarction with certainty.     If myocardial infarction is still suspected,      repeat the test at appropriate intervals.  LIPID PANEL     Status: Abnormal   Collection Time    08/14/14  5:59 AM      Result Value Ref Range   Cholesterol 111  0 - 200 mg/dL   Triglycerides 104  <150 mg/dL   HDL 18 (*) >39 mg/dL   Total CHOL/HDL Ratio 6.2     VLDL 21  0 - 40 mg/dL   LDL Cholesterol 72  0 - 99 mg/dL   Comment:            Total Cholesterol/HDL:CHD Risk     Coronary Heart Disease Risk Table                         Men   Women      1/2 Average Risk   3.4   3.3      Average Risk       5.0   4.4      2 X Average Risk   9.6   7.1      3 X Average Risk  23.4   11.0                Use the calculated Patient Ratio     above and the CHD Risk Table     to determine the patient's CHD Risk.                ATP III CLASSIFICATION (LDL):      <100     mg/dL   Optimal  100-129  mg/dL   Near or Above                        Optimal      130-159  mg/dL   Borderline      160-189  mg/dL   High      >190     mg/dL   Very High  TROPONIN I     Status: None   Collection Time    08/14/14 12:04 PM      Result Value Ref Range   Troponin I <0.30  <0.30 ng/mL   Comment:            Due to the release kinetics of cTnI,     a negative result within the first hours     of the onset of symptoms does not rule out     myocardial infarction with certainty.     If myocardial infarction is still suspected,     repeat the test at appropriate intervals.  RPR     Status: None   Collection Time    08/14/14 12:04 PM      Result Value Ref Range   RPR NON REAC  NON REAC   Comment: Performed at [facility]  CBC     Status: Abnormal   Collection Time    08/14/14  3:31 PM      Result Value Ref Range   WBC 3.4 (*) 4.0 - 10.5 K/uL   RBC 2.21 (*) 4.22 - 5.81 MIL/uL   Hemoglobin 9.0 (*) 13.0 - 17.0 g/dL   HCT 25.7 (*) 39.0 - 52.0 %   MCV 116.3 (*) 78.0 - 100.0 fL   MCH 40.7 (*) 26.0 - 34.0 pg   MCHC 35.0  30.0 - 36.0 g/dL   RDW 19.2 (*) 11.5 - 15.5 %   Platelets 123 (*) 150 -  400 K/uL  PROTIME-INR     Status: Abnormal   Collection Time    08/14/14  6:17 PM      Result Value Ref Range   Prothrombin Time 15.5 (*) 11.6 - 15.2 seconds   INR 1.22  0.00 - 1.49  CBC WITH DIFFERENTIAL     Status: Abnormal   Collection Time    08/15/14  5:40 AM      Result Value Ref Range   WBC 3.1 (*) 4.0 - 10.5 K/uL   RBC 1.91 (*) 4.22 - 5.81 MIL/uL   Hemoglobin 7.7 (*) 13.0 - 17.0 g/dL   HCT 21.8 (*) 39.0 - 52.0 %   MCV 114.1 (*) 78.0 - 100.0 fL   MCH 40.3 (*) 26.0 - 34.0 pg   MCHC 35.3  30.0 - 36.0 g/dL   RDW 19.0 (*) 11.5 - 15.5 %   Platelets 111 (*) 150 - 400 K/uL   Comment: CONSISTENT WITH PREVIOUS RESULT     SPECIMEN CHECKED FOR CLOTS   Neutrophils Relative % 57  43 - 77 %   Neutro Abs 1.8  1.7 - 7.7 K/uL   Lymphocytes Relative 38  12 - 46 %   Lymphs Abs 1.2  0.7 - 4.0 K/uL   Monocytes Relative 2 (*) 3 - 12 %   Monocytes Absolute 0.1  0.1 - 1.0 K/uL   Eosinophils Relative 2  0 - 5 %   Eosinophils Absolute 0.1  0.0 - 0.7 K/uL   Basophils Relative 0  0 - 1 %   Basophils Absolute 0.0  0.0 - 0.1 K/uL  COMPREHENSIVE METABOLIC PANEL     Status: Abnormal   Collection Time    08/15/14  5:40 AM      Result Value Ref Range   Sodium 139  137 - 147 mEq/L   Potassium 3.6 (*) 3.7 - 5.3 mEq/L   Chloride 104  96 - 112 mEq/L   CO2 26  19 - 32 mEq/L   Glucose, Bld 91  70 - 99 mg/dL   BUN 20  6 - 23 mg/dL   Creatinine, Ser 0.98  0.50 - 1.35 mg/dL   Calcium 8.5  8.4 - 10.5 mg/dL   Total Protein 5.7 (*) 6.0 - 8.3 g/dL   Albumin 3.0 (*) 3.5 - 5.2 g/dL   AST 12  0 - 37 U/L   ALT 6  0 - 53 U/L   Alkaline Phosphatase 37 (*) 39 - 117 U/L   Total Bilirubin 1.2  0.3 - 1.2 mg/dL   GFR calc non Af Amer 84 (*) >90 mL/min   GFR calc Af Amer >90  >90 mL/min   Comment: (NOTE)     The eGFR has been calculated using the CKD EPI equation.     This calculation has not been validated in all clinical situations.     eGFR's persistently <90 mL/min signify possible Chronic Kidney      Disease.   Anion gap 9  5 - 15      RADIOGRAPHY: Ct Abdomen Pelvis Wo Contrast  08/14/2014   CLINICAL DATA:  Abdominal pain, generalized. Chest pain. Incontinence for 2 months.  EXAM: CT CHEST, ABDOMEN AND PELVIS WITHOUT CONTRAST  TECHNIQUE: Multidetector CT imaging of the chest, abdomen and pelvis was performed following the standard protocol without IV contrast.  COMPARISON:  None.  FINDINGS: CT CHEST FINDINGS  Normal heart size. Normal caliber thoracic aorta. Esophagus is decompressed. Small esophageal hiatal hernia. No significant lymphadenopathy in the chest.  Focal wedge-shaped areas of atelectasis or scarring in the right middle and lower lungs. No airspace disease in the lungs. No pneumothorax. No pleural effusions.  CT ABDOMEN AND PELVIS FINDINGS  Cholelithiasis without additional inflammatory changes. Diffuse fatty infiltration of the pancreas. The unenhanced appearance of the liver, spleen, adrenal glands, kidneys, abdominal aorta, inferior vena cava, and retroperitoneal lymph nodes is unremarkable. Stomach, small bowel, and colon are decompressed. No free fluid or free air in the abdomen.  Pelvis: The bladder wall is diffusely thickened and there is infiltration in the fat around the bladder. Multiple bladder diverticula, including a large diverticulum arising from the dome of the bladder. Increased density in the posterior bladder and extending into a diverticulum, likely representing a bladder stone. Findings suggest cystitis, possibly associated with chronic bladder outlet obstruction. Enlarged lymph nodes are present adjacent to the bladder. Neoplastic involvement is not excluded. Prostate gland is not significantly enlarged. No free or loculated pelvic fluid collections are demonstrated. Appendix is normal. No evidence of diverticulitis. Degenerative changes in the thoracic and lumbar spine. No destructive bone lesions.  IMPRESSION: Multiple wedge-shaped areas of atelectasis or scarring  demonstrated in the right middle and lower lungs.  Thickening of the bladder wall with infiltration in the fat around the bladder. It multiple bladder diverticula. Bladder stones. Findings likely due to cystitis, possibly associated with chronic bladder outlet obstruction. Enlarged lymph nodes in the pelvis. Bladder neoplasm not excluded.   Electronically Signed   By: Lucienne Capers M.D.   On: 08/14/2014 02:39   Dg Chest 2 View  08/15/2014   CLINICAL DATA:  Deep venous thrombosis.  EXAM: CHEST  2 VIEW  COMPARISON:  August 13, 2014.  FINDINGS: The heart size and mediastinal contours are within normal limits. Both lungs are clear. No pneumothorax is noted. Minimal bilateral pleural effusions are noted posteriorly. The visualized skeletal structures are unremarkable.  IMPRESSION: Minimal bilateral pleural effusions. No other significant abnormality seen in the chest.   Electronically Signed   By: Sabino Dick M.D.   On: 08/15/2014 10:51   Dg Chest 2 View  08/13/2014   CLINICAL DATA:  Failure to thrive. A initial complaint to EMS was chest pain. Patient has altered mental status.  EXAM: CHEST  2 VIEW  COMPARISON:  None.  FINDINGS: Cardiac silhouette is normal in size. Normal mediastinal and hilar contours.  Clear lungs.  No pleural effusion or pneumothorax.  Bony thorax is intact.  IMPRESSION: No active cardiopulmonary disease.   Electronically Signed   By: Lajean Manes M.D.   On: 08/13/2014 22:56   Ct Head Wo Contrast  08/13/2014   CLINICAL DATA:  63 year old male found with altered mental status, dementia like activity. Initial encounter.  EXAM: CT HEAD WITHOUT CONTRAST  TECHNIQUE: Contiguous axial images were obtained from the base of the skull through the vertex without intravenous contrast.  COMPARISON:  None.  FINDINGS: Mild paranasal sinus mucosal thickening. Mastoids are clear. No acute osseous abnormality identified. Visualized orbits and scalp soft tissues are within normal limits.  Cerebral  volume is within normal limits for age. No suspicious intracranial vascular hyperdensity. No midline shift, ventriculomegaly, mass effect, evidence of mass lesion, intracranial hemorrhage or evidence of cortically based acute infarction. Gray-white matter differentiation is within normal limits throughout the brain.  IMPRESSION: Normal for age non contrast CT appearance of the brain.   Electronically Signed   By: Lars Pinks M.D.   On: 08/13/2014 23:03   Ct Chest Wo Contrast  08/14/2014   CLINICAL DATA:  Abdominal pain, generalized. Chest pain. Incontinence for 2 months.  EXAM: CT CHEST, ABDOMEN AND PELVIS WITHOUT CONTRAST  TECHNIQUE: Multidetector CT imaging of the chest, abdomen and pelvis was performed following the standard protocol without IV contrast.  COMPARISON:  None.  FINDINGS: CT CHEST FINDINGS  Normal heart size. Normal caliber thoracic aorta. Esophagus is decompressed. Small esophageal hiatal hernia. No significant lymphadenopathy in the chest.  Focal wedge-shaped areas of atelectasis or scarring in the right middle and lower lungs. No airspace disease in the lungs. No pneumothorax. No pleural effusions.  CT ABDOMEN AND PELVIS FINDINGS  Cholelithiasis without additional inflammatory changes. Diffuse fatty infiltration of the pancreas. The unenhanced appearance of the liver, spleen, adrenal glands, kidneys, abdominal aorta, inferior vena cava, and retroperitoneal lymph nodes is unremarkable. Stomach, small bowel, and colon are decompressed. No free fluid or free air in the abdomen.  Pelvis: The bladder wall is diffusely thickened and there is infiltration in the fat around the bladder. Multiple bladder diverticula, including a large diverticulum arising from the dome of the bladder. Increased density in the posterior bladder and extending into a diverticulum, likely representing a bladder stone. Findings suggest cystitis, possibly associated with chronic bladder outlet obstruction. Enlarged lymph nodes  are present adjacent to the bladder. Neoplastic involvement is not excluded. Prostate gland is not significantly enlarged. No free or loculated pelvic fluid collections are demonstrated. Appendix is normal. No evidence of diverticulitis. Degenerative changes in the thoracic and lumbar spine. No destructive bone lesions.  IMPRESSION: Multiple wedge-shaped areas of atelectasis or  scarring demonstrated in the right middle and lower lungs.  Thickening of the bladder wall with infiltration in the fat around the bladder. It multiple bladder diverticula. Bladder stones. Findings likely due to cystitis, possibly associated with chronic bladder outlet obstruction. Enlarged lymph nodes in the pelvis. Bladder neoplasm not excluded.   Electronically Signed   By: Lucienne Capers M.D.   On: 08/14/2014 02:39   Mr Brain Wo Contrast  08/14/2014   CLINICAL DATA:  63 year old male with encephalopathy, failure to thrive, progressive weakness and confusion for the past 2 months. Subsequent encounter.  EXAM: MRI HEAD WITHOUT CONTRAST  TECHNIQUE: Multiplanar, multiecho pulse sequences of the brain and surrounding structures were obtained without intravenous contrast.  COMPARISON:  08/13/2014 CT.  No comparison MR.  FINDINGS: Exam is motion degraded.  No acute infarct.  No intracranial hemorrhage.  Questionable subtle T2 increased signal within the uncus/hippocampus and subinsular region bilaterally. With this distribution, infection such as herpes encephalitis cannot be excluded. There are however, no changes on the diffusion sequence as can be seen with herpes encephalitis and patient's symptoms have been going on for 2 months. Clinical and laboratory correlation recommended. Limbic encephalitis can present with a similar appearance. Result of metabolic abnormality (vitamin-B deficiency) is a less likely consideration. Primary brain tumor felt unlikely. If the patient had persistent or progressive symptoms, close followup MR with  contrast may be considered.  Patchy and punctate white matter type changes consistent with result of small vessel disease.  No hydrocephalus  No intracranial mass lesion seen from above described findings.  C3-4 disc protrusion with spinal stenosis and mild cord flattening.  Mild paranasal sinus mucosal thickening.  Major intracranial vascular structures are patent.  Small pituitary gland. Pineal region unremarkable. Orbital structures unremarkable.  IMPRESSION: Exam is motion degraded.  No acute infarct.  Questionable subtle T2 increased signal within the uncus/hippocampus and subinsular region bilaterally. With this distribution, infection such as herpes encephalitis cannot be excluded. There are however, no changes on the diffusion sequence as can be seen with herpes encephalitis and patient's symptoms have been going on for 2 months. Clinical and laboratory correlation recommended.  Limbic encephalitis can present with a similar appearance. Result of metabolic abnormality (vitamin-B deficiency) is a less likely consideration. Primary brain tumor felt unlikely. If the patient had persistent or progressive symptoms, close followup MR with contrast may be considered.  Mild small vessel disease.  C3-4 disc protrusion with spinal stenosis and mild cord flattening.  These results were called by telephone at the time of interpretation on 08/14/2014 at 9:24 am to Dr. Shanda Howells , who verbally acknowledged these results.   Electronically Signed   By: Chauncey Cruel M.D.   On: 08/14/2014 09:37   Nm Pulmonary Perf And Vent  08/15/2014   CLINICAL DATA:  Altered mental status. No current shortness of breath or chest pain.  EXAM: NUCLEAR MEDICINE VENTILATION - PERFUSION LUNG SCAN  TECHNIQUE: Ventilation images were obtained in multiple projections using inhaled aerosol technetium 99 M DTPA. Perfusion images were obtained in multiple projections after intravenous injection of Tc-41mMAA.  RADIOPHARMACEUTICALS:  40.0 mCi  Tc-918mTPA aerosol and 6.0 mCi Tc-9940mA  COMPARISON:  Chest radiograph, 08/15/2014.  FINDINGS: Ventilation: No focal ventilation defect.  Perfusion: No wedge shaped peripheral perfusion defects to suggest acute pulmonary embolism.  IMPRESSION: No evidence of a pulmonary embolism.   Electronically Signed   By: DavLajean ManesD.   On: 08/15/2014 11:45   Us Koreanous Img Lower  Unilateral Right  08/14/2014   CLINICAL DATA:  Right lower extremity pain and edema. Evaluate for DVT.  EXAM: RIGHT LOWER EXTREMITY VENOUS DOPPLER ULTRASOUND  TECHNIQUE: Gray-scale sonography with graded compression, as well as color Doppler and duplex ultrasound were performed to evaluate the lower extremity deep venous systems from the level of the common femoral vein and including the common femoral, femoral, profunda femoral, popliteal and calf veins including the posterior tibial, peroneal and gastrocnemius veins when visible. The superficial great saphenous vein was also interrogated. Spectral Doppler was utilized to evaluate flow at rest and with distal augmentation maneuvers in the common femoral, femoral and popliteal veins.  COMPARISON:  None.  FINDINGS: There is a minimal amount of eccentric mixed echogenic nonocclusive thrombus within right common femoral vein (images 2 and 4). Nonocclusive thrombus extends to involve the imaged portions of the proximal (images 13 and 14), mid (images 16 and 17) and distal (images 21 and 22) aspects of the right superficial femoral vein.  The right deep femoral and popliteal veins are widely patent. The greater saphenous vein appears widely patent throughout its imaged course.  Incidental note is made of a serpiginous approximately 2.8 x 0.7 cm fluid collection within the right popliteal fossa favored to represent Baker's cyst.  There is subcutaneous edema at the level of the calf. Note is made of two benign appearing right inguinal lymph nodes which are not enlarged by size criteria and maintain  benign fatty hila.  IMPRESSION: 1. Examination is positive for nonocclusive thrombus extending from the level of the right common femoral vein to the distal aspect of the right superficial femoral vein. 2. Incidental note made of an approximately 2.8 cm right-sided Baker cyst. These results will be called to the ordering clinician or representative by the Radiologist Assistant, and communication documented in the PACS or zVision Dashboard.   Electronically Signed   By: Sandi Mariscal M.D.   On: 08/14/2014 17:15       PATHOLOGY:  None  ASSESSMENT:  1. Right lower extremity, nonocclusive, DVT.  Etiology unknown other than sedentary lifestyle. 2. Pancytopenia, likely secondary to B12 deficiency.   3. Macrocytic anemia, secondary to B12 deficiency 4. B 12 deficiency 5. Leukopenia 6. Thrombocytopenia 7. Dehydration, resolved 8. Hypokalemia 9. Limbic encephalitis, followed by neurology 10. Failure to thrive  Patient Active Problem List   Diagnosis Date Noted  . Acute encephalopathy 08/14/2014  . Adult failure to thrive 08/14/2014  . UTI (lower urinary tract infection) 08/14/2014  . Leg edema, right 08/14/2014  . Pancytopenia 08/14/2014  . Malnutrition of moderate degree 08/14/2014  . Vitamin B12 deficiency 08/14/2014  . Right leg DVT 08/14/2014     PLAN:  1. I personally reviewed and went over laboratory results with the patient.  The results are noted within this dictation. 2. I personally reviewed and went over radiographic studies with the patient.  The results are noted within this dictation.   3. Chart reviewed 4. Labs today: ANA, MMA, hypercoag panel, erythropeitin level, soluble transferrin receptor, HIV antibody, hepatitis panel, multiple myeloma panel, B2M. 5. Recommend 2D echo to complete work-up for DVT 6. Recommend full-dose Eliquis for anticoagulation for at least 6 months. 7. Outpatient appointment with the [facility] for follow-up in 3 weeks following  discharge from the hospital.  All questions were answered. The patient knows to call the clinic with any problems, questions or concerns. We can certainly see the patient much sooner if necessary.  Patient and plan discussed  with Dr. Farrel Gobble and he is in agreement with the aforementioned.   Doroteo Bradford 08/15/2014

## 2014-08-15 NOTE — Care Management Note (Signed)
    Page 1 of 1   08/22/2014     11:59:20 AM CARE MANAGEMENT NOTE 08/22/2014  [facility]   Account Number:  [id number]  Date Initiated:  08/15/2014  Documentation initiated by:  Anibal HendersonBOLDEN,Suhan Paci  Subjective/Objective Assessment:   Spoke with son and pt in the room. Pt is from home alone, with only son checking on him daily.  Son would really like for pt to go to rehab, but PT only recommends ALF, or [facility]     Action/Plan:   Per son pt was completely independent 1-2 mon ago and he is not at all near his baseline.  PT will see pt again in am  Will follow for needs   Anticipated DC Date:  08/22/2014   Anticipated DC Plan:  SKILLED NURSING FACILITY  In-house referral  Clinical Social Worker      DC Planning Services  CM consult      Choice offered to / List presented to:             Status of service:  Completed, signed off Medicare Important Message given?  YES (If response is "NO", the following Medicare IM given date fields will be blank) Date Medicare IM given:  08/22/2014 Medicare IM given by:  Anibal HendersonBOLDEN,Bow Buntyn Date Additional Medicare IM given:   Additional Medicare IM given by:    Discharge Disposition:  SKILLED NURSING FACILITY  Per UR Regulation:  Reviewed for med. necessity/level of care/duration of stay  If discussed at Long Length of Stay Meetings, dates discussed:    Comments:  08/22/14 1155 Anibal HendersonGeneva Shanikka Wonders RN/CM [facility] 08/21/14 1500 Anibal HendersonGeneva Sherhonda Gaspar RN/CM [facility], and is to see urologist today for hematuria 08/16/14 1600 Anibal HendersonGeneva Rusti Arizmendi RN/CM PT and OT saw pt today and both recommending SNF short term due to balance deficits, and changes from patients usual baseline of independent, and now needs a walker. CSW notified 08/15/14 1315 Anibal HendersonGeneva Axel Meas RN/CM

## 2014-08-16 ENCOUNTER — Inpatient Hospital Stay (HOSPITAL_COMMUNITY)
Admission: EM | Admit: 2014-08-16 | Discharge: 2014-08-16 | Disposition: A | Discharge status: Home / Self Care | Payer: Medicare Other | Source: Home / Self Care | Attending: Neurology | Admitting: Neurology

## 2014-08-16 DIAGNOSIS — E43 Unspecified severe protein-calorie malnutrition: Secondary | ICD-10-CM

## 2014-08-16 DIAGNOSIS — D61818 Other pancytopenia: Secondary | ICD-10-CM

## 2014-08-16 LAB — HEPATITIS PANEL, ACUTE
HCV Ab: NEGATIVE
HEP B C IGM: NONREACTIVE
HEP B S AG: NEGATIVE
Hep A IgM: NONREACTIVE

## 2014-08-16 LAB — BASIC METABOLIC PANEL
Anion gap: 10 (ref 5–15)
BUN: 12 mg/dL (ref 6–23)
CHLORIDE: 105 meq/L (ref 96–112)
CO2: 25 meq/L (ref 19–32)
Calcium: 8.2 mg/dL — ABNORMAL LOW (ref 8.4–10.5)
Creatinine, Ser: 0.9 mg/dL (ref 0.50–1.35)
GFR calc Af Amer: >90 mL/min (ref >90)
GFR calc non Af Amer: 87 mL/min — ABNORMAL LOW (ref >90)
Glucose, Bld: 102 mg/dL — ABNORMAL HIGH (ref 70–99)
Potassium: 3.3 mEq/L — ABNORMAL LOW (ref 3.7–5.3)
SODIUM: 140 meq/L (ref 137–147)

## 2014-08-16 LAB — URINE MICROSCOPIC-ADD ON

## 2014-08-16 LAB — URINALYSIS, ROUTINE W REFLEX MICROSCOPIC
Glucose, UA: >1000 mg/dL — AB
NITRITE: POSITIVE — AB
PH: 8 (ref 5.0–8.0)
Specific Gravity, Urine: <1.005 — ABNORMAL LOW (ref 1.005–1.030)
Urobilinogen, UA: >8 mg/dL — ABNORMAL HIGH (ref 0.0–1.0)

## 2014-08-16 LAB — CBC
HCT: 19.8 % — ABNORMAL LOW (ref 39.0–52.0)
HEMATOCRIT: 19.5 % — AB (ref 39.0–52.0)
HEMOGLOBIN: 7 g/dL — AB (ref 13.0–17.0)
Hemoglobin: 7.1 g/dL — ABNORMAL LOW (ref 13.0–17.0)
MCH: 40.9 pg — ABNORMAL HIGH (ref 26.0–34.0)
MCH: 40.8 pg — ABNORMAL HIGH (ref 26.0–34.0)
MCHC: 35.9 g/dL (ref 30.0–36.0)
MCHC: 35.9 g/dL (ref 30.0–36.0)
MCV: 114 fL — ABNORMAL HIGH (ref 78.0–100.0)
MCV: 113.8 fL — ABNORMAL HIGH (ref 78.0–100.0)
PLATELETS: 117 10*3/uL — AB (ref 150–400)
Platelets: 99 10*3/uL — ABNORMAL LOW (ref 150–400)
RBC: 1.71 MIL/uL — ABNORMAL LOW (ref 4.22–5.81)
RBC: 1.74 MIL/uL — ABNORMAL LOW (ref 4.22–5.81)
RDW: 18.8 % — AB (ref 11.5–15.5)
RDW: 19 % — AB (ref 11.5–15.5)
WBC: 2.7 10*3/uL — AB (ref 4.0–10.5)
WBC: 4.5 10*3/uL (ref 4.0–10.5)

## 2014-08-16 LAB — PREPARE RBC (CROSSMATCH)

## 2014-08-16 LAB — ABO/RH: ABO/RH(D): AB NEG

## 2014-08-16 LAB — HEPARIN LEVEL (UNFRACTIONATED)

## 2014-08-16 LAB — ANA: Anti Nuclear Antibody(ANA): NEGATIVE

## 2014-08-16 LAB — HOMOCYSTEINE: Homocysteine: 135.7 umol/L — ABNORMAL HIGH (ref 4.0–15.4)

## 2014-08-16 LAB — VANCOMYCIN, TROUGH: Vancomycin Tr: 16.2 ug/mL (ref 10.0–20.0)

## 2014-08-16 LAB — APTT: APTT: 38 s — AB (ref 24–37)

## 2014-08-16 LAB — HIV ANTIBODY (ROUTINE TESTING W REFLEX): HIV: NONREACTIVE

## 2014-08-16 LAB — ANTITHROMBIN III: AntiThromb III Func: 80 % (ref 75–120)

## 2014-08-16 MED ORDER — SODIUM CHLORIDE 0.9 % IV SOLN: Freq: Once | INTRAVENOUS | Status: DC

## 2014-08-16 MED ORDER — HEPARIN (PORCINE) IN NACL 100-0.45 UNIT/ML-% IJ SOLN
1000.0000 [IU]/h | INTRAMUSCULAR | Status: DC
  Administered 2014-08-17 – 2014-08-18 (×2): 1000 [IU]/h via INTRAVENOUS
  Filled 2014-08-16 (×2): qty 250

## 2014-08-16 MED ORDER — CHOLECALCIFEROL 10 MCG (400 UNIT) PO TABS
400.0000 [IU] | ORAL_TABLET | Freq: Every day | ORAL | Status: DC
  Administered 2014-08-16 – 2014-08-22 (×7): 400 [IU] via ORAL
  Filled 2014-08-16 (×8): qty 1

## 2014-08-16 MED ORDER — ONDANSETRON HCL 4 MG/2ML IJ SOLN
4.0000 mg | Freq: Four times a day (QID) | INTRAMUSCULAR | Status: DC | PRN
  Administered 2014-08-16 – 2014-08-17 (×2): 4 mg via INTRAVENOUS
  Filled 2014-08-16 (×2): qty 2

## 2014-08-16 MED ORDER — POTASSIUM CHLORIDE CRYS ER 20 MEQ PO TBCR
40.0000 meq | EXTENDED_RELEASE_TABLET | ORAL | Status: AC
  Administered 2014-08-16 (×2): 40 meq via ORAL
  Filled 2014-08-16 (×2): qty 2

## 2014-08-16 NOTE — Progress Notes (Signed)
ANTICOAGULATION CONSULT NOTE - Initial Consult  Pharmacy Consult for Heparin Indication: DVT  No Known Allergies  Patient Measurements: Height: 5\' 7"  (170.2 cm) (5'7') Weight: 164 lb 14.5 oz (74.8 kg) IBW/kg (Calculated) : 66.1 Heparin Dosing Weight: 74.8 kg  Vital Signs: Temp: 99.1 F (37.3 C) (10/22 2028) Temp Source: Oral (10/22 2028) BP: 122/58 mmHg (10/22 2028) Pulse Rate: 80 (10/22 2028)  Labs:  Recent Labs  08/14/14 0041  08/14/14 0559 08/14/14 1204 08/14/14 1531 08/14/14 1817 08/15/14 0540 08/16/14 0248 08/16/14 2042  HGB  --   < > 7.7*  --  9.0*  --  7.7* 7.0* 7.1*  HCT  --   < > 22.5*  --  25.7*  --  21.8* 19.5* 19.8*  PLT  --   < > 111*  --  123*  --  111* 99* 117*  LABPROT  --   --   --   --   --  15.5*  --   --   --   INR  --   --   --   --   --  1.22  --   --   --   CREATININE  --   --  0.86  --   --   --  0.98 0.90  --   TROPONINI <0.30  --  <0.30 <0.30  --   --   --   --   --   < > = values in this interval not displayed.  Estimated Creatinine Clearance: 76.5 ml/min (by C-G formula based on Cr of 0.9).   Medical History: History reviewed. No pertinent past medical history.  Medications:  Scheduled:  . cholecalciferol  400 Units Oral Daily  . ciprofloxacin  500 mg Oral BID  . cyanocobalamin  1,000 mcg Intramuscular Daily  . feeding supplement (ENSURE COMPLETE)  237 mL Oral BID BM  . folic acid  1 mg Oral Daily  . pantoprazole  40 mg Oral Daily  . prenatal multivitamin  1 tablet Oral Q1200  . thiamine  100 mg Intravenous Daily    Assessment: 63 yo male with RLE DVT Patient had been on Lovenox, transitioned to Apixaban on 08/15/2014 for DVT MD requests discontinuation of Apixaban and convert to heparin Last apixaban dose today at 11:41 [facility] note indicates significant bleeding from penis, if HGB < 7, 1 unit of PRBC to be given Baseline HL and aPTT has been ordered Due to bleeding noted, aPTT / unfractionated heparin level at lower  range of therapeutic goal range  Goal of Therapy:  APTT level 66-102s Heparin level 0.3-0.7 units/ml Monitor platelets by anticoagulation protocol: Yes   Plan:  Start heparin infusion at 1000 units/hr tonight at 12 midnight, with no bolus Unfractionated heparin level at 8 AM APTT level at 8 AM Monitor CBC, platelets  Cruzita Lipa Bennett 08/16/2014,9:28 PM

## 2014-08-16 NOTE — Progress Notes (Addendum)
NUTRITION FOLLOW UP  Pt meets criteria for moderate MALNUTRITION in the context of chronic illness as evidenced by mild fat and muscle depletion, <75% of estimated energy intake x 1 month.  Intervention:   -Continue with Ensure Complete po BID, each supplement provides 350 kcal and 13 grams of protein  Nutrition Dx:   Inadequate oral intake related to altered taste perception as evidenced by diet recall, mild fat and muscle depletion.   Goal:   Pt will meet >90% of estimated nutritional needs  Monitor:   PO/supplement intake, labs, weight changes, I/O's  Assessment:   This is a 63 y.o. year old male with no prior medical care presenting with encephalopathy and failure to thrive. Family states that pt lives alone. He has never seen a doctor per pt and family. Family states that pt has had progressive weakness, decreased appetite, confusion over past 2 months. No known prior history of medical problems. Pt denies any ETOH or tobacco abuse. Does report progressive weight loss and decreased appetite over extended period of time. Per report, pt was found covered in his own fecal material and urine at home today by EMS  RD received consult to assess nutritional needs and status. Pt was last evaluated on 08/14/14 and met criteria for moderate malnutrition. Pt reports his appetite has improved since admission. He is eating well; PO: 50-100%. He reports he ate all of his breakfast this morning and is tolerating diet well. Chart review indicates that pt has multiple vitamin and mineral deficiencies. Currently receiving B1, Q65, Vitamin D, folic acid, and MVI supplements. He reports feeling better. Pt physically looks better from last RD visit.  Noted two bottles of Ensure at bedside table. Pt reports that he is going to drink them, but doesn't feel like drinking at this moment. Discussed importance of drinking supplement to maximize nutritional intake. Offered to substitute for a different supplement,  but pt reports he likes the flavor of Ensure and would like to continue receiving it. Encouraged pt to drink Ensure when offered.  Labs reviewed. K: 3.3. BUN now WDL. Calcium: 8.2. Glucose: 102.   Ht Readings from Last 1 Encounters:  08/14/14 _0  (1.702 m)    Weight Status:   Wt Readings from Last 1 Encounters:  08/16/14 164 lb 14.5 oz (74.8 kg)  08/14/14  155 lb 10.3 oz (70.6 kg)   Re-estimated needs:  Kcal: 2000-2200 Protein: 90-100 grams Fluid: 2.0-2.2 L  Skin: rashes on penis perineum, sacrum, and scrotum   Diet Order: Dysphagia 2   Intake/Output Summary (Last 24 hours) at 08/16/14 0931 Last data filed at 08/16/14 0919  Gross per 24 hour  Intake    590 ml  Output   1425 ml  Net   -835 ml    Last BM: 08/14/14   Labs:   Recent Labs Lab 08/14/14 0559 08/15/14 0540 08/16/14 0248  NA 140 139 140  K 4.1 3.6* 3.3*  CL 103 104 105  CO2 _1 BUN 29* 20 12  CREATININE 0.86 0.98 0.90  CALCIUM 8.4 8.5 8.2*  GLUCOSE 80 91 102*    CBG (last 3)  No results found for this basename: GLUCAP,  in the last 72 hours  Scheduled Meds: . apixaban  10 mg Oral BID  . [START ON 08/22/2014] apixaban  5 mg Oral BID  . cholecalciferol  400 Units Oral Daily  . ciprofloxacin  500 mg Oral BID  . cyanocobalamin  1,000 mcg Intramuscular Daily  .  feeding supplement (ENSURE COMPLETE)  237 mL Oral BID BM  . folic acid  1 mg Oral Daily  . pantoprazole  40 mg Oral Daily  . prenatal multivitamin  1 tablet Oral Q1200  . thiamine  100 mg Intravenous Daily    Continuous Infusions: . sodium chloride 70 mL/hr at 08/15/14 2342    Jurni Cesaro A. Jimmye Norman, RD, LDN Pager: [phone]

## 2014-08-16 NOTE — Progress Notes (Signed)
EEG Completed; Results Pending  

## 2014-08-16 NOTE — Progress Notes (Signed)
Patient ID: Rivan Siordia, male   DOB: 09/22/1951, 63 y.o.   MRN: [id number]   [place] A. Merlene Laughter, MD     [email or web address]          Murphy Duzan is an 63 y.o. male.   Assessment/Plan: 1. Multifactorial progressive encephalopathy. Etiologies includes severe nutritional deficiency, dehydration and UTI. The patient appropriately has been placed on antibiotics and is being replaced with vitamin B12 and vitamin B1. Folic acid was be added. An EEG will be obtained.   2. Questionable abnormal MRI findings. If the findings are real, I suspect that vitamin B1 deficiency is the most likely etiology. Other etiologies seem unlikely in this clinical situation.   The patient appears to be more responsive today. He does recognize me from last night.   GENERAL: He appeared malnourished and unkept.  HEENT: Supple. Atraumatic normocephalic.  ABDOMEN: soft  EXTREMITIES: No edema  BACK: Normal.  SKIN: Normal by inspection.  MENTAL STATUS: He is awake and alert. He knows that he is in the hospital in [place]. He thinks his 89. He does follow commands briskly. No dysarthria is observed. Comprehension seems good.  CRANIAL NERVES: Pupils are equal, round and reactive to light and accommodation; extra ocular movements are full, there is no significant nystagmus; visual fields are full; upper and lower facial muscles are normal in strength and symmetric, there is no flattening of the nasolabial folds; tongue is midline; uvula is midline; shoulder elevation is normal.  MOTOR: Normal tone, bulk and strength throughout.  COORDINATION: Left finger to nose is normal, right finger to nose is normal, No rest tremor; no intention tremor; no postural tremor; no bradykinesia.  REFLEXES: Deep tendon reflexes are symmetrical and normal. Babinski reflexes are flexor bilaterally.  SENSATION: Normal to light touch.     Objective: Vital signs in last 24 hours: Temp:  [98.2 F (36.8 C)-98.4 F  (36.9 C)] 98.2 F (36.8 C) (10/22 1429) Pulse Rate:  [64-69] 69 (10/22 1429) Resp:  [17-18] 18 (10/22 1429) BP: (91-118)/(59-77) 118/59 mmHg (10/22 1429) SpO2:  [98 %-99 %] 99 % (10/22 1429) Weight:  [74.8 kg (164 lb 14.5 oz)] 74.8 kg (164 lb 14.5 oz) (10/22 0642)  Intake/Output from previous day: 10/21 0701 - 10/22 0700 In: 710 [P.O.:710] Out: 1425 [Urine:1425] Intake/Output this shift: Total I/O In: 480 [P.O.:480] Out: 500 [Urine:500] Nutritional status: Dysphagia   Lab Results: Results for orders placed during the hospital encounter of 08/13/14 (from the past 48 hour(s))  CBC WITH DIFFERENTIAL     Status: Abnormal   Collection Time    08/15/14  5:40 AM      Result Value Ref Range   WBC 3.1 (*) 4.0 - 10.5 K/uL   RBC 1.91 (*) 4.22 - 5.81 MIL/uL   Hemoglobin 7.7 (*) 13.0 - 17.0 g/dL   HCT 21.8 (*) 39.0 - 52.0 %   MCV 114.1 (*) 78.0 - 100.0 fL   MCH 40.3 (*) 26.0 - 34.0 pg   MCHC 35.3  30.0 - 36.0 g/dL   RDW 19.0 (*) 11.5 - 15.5 %   Platelets 111 (*) 150 - 400 K/uL   Comment: CONSISTENT WITH PREVIOUS RESULT     SPECIMEN CHECKED FOR CLOTS   Neutrophils Relative % 57  43 - 77 %   Neutro Abs 1.8  1.7 - 7.7 K/uL   Lymphocytes Relative 38  12 - 46 %   Lymphs Abs 1.2  0.7 - 4.0 K/uL   Monocytes Relative 2 (*) 3 -  12 %   Monocytes Absolute 0.1  0.1 - 1.0 K/uL   Eosinophils Relative 2  0 - 5 %   Eosinophils Absolute 0.1  0.0 - 0.7 K/uL   Basophils Relative 0  0 - 1 %   Basophils Absolute 0.0  0.0 - 0.1 K/uL  COMPREHENSIVE METABOLIC PANEL     Status: Abnormal   Collection Time    08/15/14  5:40 AM      Result Value Ref Range   Sodium 139  137 - 147 mEq/L   Potassium 3.6 (*) 3.7 - 5.3 mEq/L   Chloride 104  96 - 112 mEq/L   CO2 26  19 - 32 mEq/L   Glucose, Bld 91  70 - 99 mg/dL   BUN 20  6 - 23 mg/dL   Creatinine, Ser 0.98  0.50 - 1.35 mg/dL   Calcium 8.5  8.4 - 10.5 mg/dL   Total Protein 5.7 (*) 6.0 - 8.3 g/dL   Albumin 3.0 (*) 3.5 - 5.2 g/dL   AST 12  0 - 37 U/L    ALT 6  0 - 53 U/L   Alkaline Phosphatase 37 (*) 39 - 117 U/L   Total Bilirubin 1.2  0.3 - 1.2 mg/dL   GFR calc non Af Amer 84 (*) >90 mL/min   GFR calc Af Amer >90  >90 mL/min   Comment: (NOTE)     The eGFR has been calculated using the CKD EPI equation.     This calculation has not been validated in all clinical situations.     eGFR's persistently <90 mL/min signify possible Chronic Kidney     Disease.   Anion gap 9  5 - 15  ANA     Status: None   Collection Time    08/15/14  1:53 PM      Result Value Ref Range   ANA NEGATIVE  NEGATIVE   Comment: Performed at [facility]     Status: None   Collection Time    08/15/14  1:53 PM      Result Value Ref Range   AntiThromb III Func 80  75 - 120 %   Comment: Performed at [place]     Status: Abnormal   Collection Time    08/15/14  1:53 PM      Result Value Ref Range   Homocysteine 135.7 (*) 4.0 - 15.4 umol/L   Comment: (NOTE)     Result repeated and verified.     Result confirmed by automatic dilution.     Performed at [facility], IGG, IGM, IGA     Status: Abnormal   Collection Time    08/15/14  1:53 PM      Result Value Ref Range   Anticardiolipin IgG 5 (*) <23 GPL U/mL   Anticardiolipin IgM 2 (*) <11 MPL U/mL   Anticardiolipin IgA 10 (*) <22 APL U/mL   Comment: (NOTE)     Reference Range:  Cardiolipin IgG       Normal                  <23       Low Positive (+)        23-35       Moderate Positive (+)   36-50       High Positive (+)       >50     Reference Range:  Cardiolipin  IgM       Normal                  <11       Low Positive (+)        11-20       Moderate Positive (+)   21-30       High Positive (+)       >30     Reference Range:  Cardiolipin IgA       Normal                  <22       Low Positive (+)        22-35       Moderate Positive (+)   36-45       High Positive (+)       >45     Performed at [facility]    HIV ANTIBODY (ROUTINE TESTING)     Status: None   Collection Time    08/15/14  1:53 PM      Result Value Ref Range   HIV 1&2 Ab, 4th Generation NONREACTIVE  NONREACTIVE   Comment: (NOTE)     A NONREACTIVE HIV Ag/Ab result does not exclude HIV infection since     the time frame for seroconversion is variable. If acute HIV infection     is suspected, a HIV-1 RNA Qualitative TMA test is recommended.     HIV-1/2 Antibody Diff         Not indicated.     HIV-1 RNA, Qual TMA           Not indicated.     PLEASE NOTE: This information has been disclosed to you from records     whose confidentiality may be protected by state law. If your state     requires such protection, then the state law prohibits you from making     any further disclosure of the information without the specific written     consent of the person to whom it pertains, or as otherwise permitted     by law. A general authorization for the release of medical or other     information is NOT sufficient for this purpose.     The performance of this assay has not been clinically validated in     patients less than 53 years old.     Performed at [facility], ACUTE     Status: None   Collection Time    08/15/14  1:53 PM      Result Value Ref Range   Hepatitis B Surface Ag NEGATIVE  NEGATIVE   HCV Ab NEGATIVE  NEGATIVE   Hep A IgM NON REACTIVE  NON REACTIVE   Comment: (NOTE)     Effective September 10, 2014, Hepatitis Acute Panel (test code [phone])     will be revised to automatically reflex to the Hepatitis C Viral RNA,     Quantitative, Real-Time PCR assay if the Hepatitis C antibody     screening result is Reactive. This action is being taken to ensure     that the CDC/USPSTF recommended HCV diagnostic algorithm with the     appropriate test reflex needed for accurate interpretation is     followed.   Hep B C IgM NON REACTIVE  NON REACTIVE   Comment: (NOTE)     High levels of Hepatitis B Core IgM  antibody are  detectable     during the acute stage of Hepatitis B. This antibody is used     to differentiate current from past HBV infection.     Performed at [facility], SERUM     Status: None   Collection Time    08/15/14  1:53 PM      Result Value Ref Range   Total Protein 6.3  6.0 - 8.3 g/dL   Albumin ELP PENDING  55.8 - 66.1 %   Alpha-1-Globulin PENDING  2.9 - 4.9 %   Alpha-2-Globulin PENDING  7.1 - 11.8 %   Beta Globulin PENDING  4.7 - 7.2 %   Beta 2 PENDING  3.2 - 6.5 %   Gamma Globulin PENDING  11.1 - 18.8 %   M-Spike, % PENDING     SPE Interp. PENDING     Comment PENDING     IgG (Immunoglobin G), Serum 1080  650 - 1600 mg/dL   IgA 321  68 - 379 mg/dL   IgM, Serum 83  41 - 251 mg/dL   Comment: Performed at [facility] PENDING    VANCOMYCIN, TROUGH     Status: None   Collection Time    08/16/14  2:48 AM      Result Value Ref Range   Vancomycin Tr 16.2  10.0 - 20.0 ug/mL  BASIC METABOLIC PANEL     Status: Abnormal   Collection Time    08/16/14  2:48 AM      Result Value Ref Range   Sodium 140  137 - 147 mEq/L   Potassium 3.3 (*) 3.7 - 5.3 mEq/L   Chloride 105  96 - 112 mEq/L   CO2 25  19 - 32 mEq/L   Glucose, Bld 102 (*) 70 - 99 mg/dL   BUN 12  6 - 23 mg/dL   Creatinine, Ser 0.90  0.50 - 1.35 mg/dL   Calcium 8.2 (*) 8.4 - 10.5 mg/dL   GFR calc non Af Amer 87 (*) >90 mL/min   GFR calc Af Amer >90  >90 mL/min   Comment: (NOTE)     The eGFR has been calculated using the CKD EPI equation.     This calculation has not been validated in all clinical situations.     eGFR's persistently <90 mL/min signify possible Chronic Kidney     Disease.   Anion gap 10  5 - 15  CBC     Status: Abnormal   Collection Time    08/16/14  2:48 AM      Result Value Ref Range   WBC 2.7 (*) 4.0 - 10.5 K/uL   RBC 1.71 (*) 4.22 - 5.81 MIL/uL   Hemoglobin 7.0 (*) 13.0 - 17.0 g/dL   HCT 19.5 (*) 39.0 - 52.0 %   MCV 114.0  (*) 78.0 - 100.0 fL   MCH 40.9 (*) 26.0 - 34.0 pg   MCHC 35.9  30.0 - 36.0 g/dL   RDW 18.8 (*) 11.5 - 15.5 %   Platelets 99 (*) 150 - 400 K/uL   Comment: CONSISTENT WITH PREVIOUS RESULT  URINALYSIS, ROUTINE W REFLEX MICROSCOPIC     Status: Abnormal   Collection Time    08/16/14  5:15 PM      Result Value Ref Range   Color, Urine RED (*) YELLOW   Comment: BIOCHEMICALS MAY BE AFFECTED BY COLOR   APPearance HAZY (*) CLEAR   Specific Gravity, Urine <  1.005 (*) 1.005 - 1.030   pH 8.0  5.0 - 8.0   Glucose, UA >1000 (*) NEGATIVE mg/dL   Hgb urine dipstick LARGE (*) NEGATIVE   Bilirubin Urine LARGE (*) NEGATIVE   Ketones, ur >80 (*) NEGATIVE mg/dL   Protein, ur >300 (*) NEGATIVE mg/dL   Urobilinogen, UA >8.0 (*) 0.0 - 1.0 mg/dL   Nitrite POSITIVE (*) NEGATIVE   Leukocytes, UA LARGE (*) NEGATIVE  URINE MICROSCOPIC-ADD ON     Status: Abnormal   Collection Time    08/16/14  5:15 PM      Result Value Ref Range   Squamous Epithelial / LPF RARE  RARE   WBC, UA 21-50  <3 WBC/hpf   RBC / HPF TOO NUMEROUS TO COUNT  <3 RBC/hpf   Bacteria, UA FEW (*) RARE    Lipid Panel  Recent Labs  08/14/14 0559  CHOL 111  TRIG 104  HDL 18*  CHOLHDL 6.2  VLDL 21  LDLCALC 72    Studies/Results: EEG NORMAL  Medications:  Scheduled Meds: . apixaban  10 mg Oral BID  . [START ON 08/22/2014] apixaban  5 mg Oral BID  . cholecalciferol  400 Units Oral Daily  . ciprofloxacin  500 mg Oral BID  . cyanocobalamin  1,000 mcg Intramuscular Daily  . feeding supplement (ENSURE COMPLETE)  237 mL Oral BID BM  . folic acid  1 mg Oral Daily  . pantoprazole  40 mg Oral Daily  . prenatal multivitamin  1 tablet Oral Q1200  . thiamine  100 mg Intravenous Daily   Continuous Infusions: . sodium chloride 70 mL/hr at 08/16/14 1511   PRN Meds:.ondansetron (ZOFRAN) IV     LOS: 3 days   Lailanie Hasley A. Merlene Laughter, M.D.  Diplomate, Tax adviser of Psychiatry and Neurology ( Neurology).

## 2014-08-16 NOTE — Evaluation (Signed)
Occupational Therapy Evaluation Patient Details Name: Edwin [name] MRN: [id number] DOB: 08/19/1951 Today's Date: 08/16/2014    History of Present Illness This is a 63 y.o. year old male with no prior medical care  presenting with encephalopathy and failure to thrive. Family states that pt lives alone. He has never seen a doctor per pt and family. Family states that pt has had progressive weakness, decreased appetite, confusion over past 2 months. No known prior history of medical problems. Pt denies any ETOH or tobacco abuse. Does report progressive weight loss and decreased appetite over extended period of time. Per report, pt was found covered in his own fecal material and urine  at home today by EMS   Clinical Impression   Pt presenting to acute OT with above situation.  He has generalized weakness in BUE and per pt report has been having increase difficulty with ADLs at home.  Pt lives alone, and son checks on him daily, but is unable to provide 24 hour supervision.  Pt will benefit from continued OT services to improve independence and safety with ADLs.  Due to need for safety and supervision with mobility, pt will require SNF OT at d/c.    Follow Up Recommendations  SNF    Equipment Recommendations  Other (comment) (Defer to SNF.  Recommend shower seat prior to d/c home)    Recommendations for Other Services       Precautions / Restrictions Precautions Precautions: Fall Restrictions Weight Bearing Restrictions: No      Mobility Bed Mobility                  Transfers                      Balance                                            ADL Overall ADL's : Needs assistance/impaired Eating/Feeding: Set up   Grooming: Supervision/safety;Sitting               Lower Body Dressing: Min guard Lower Body Dressing Details (indicate cue type and reason): for socks               General ADL Comments: Pt is presenting with  decreased speed and balance during ADL tasks.  Pt was able to manage socks and opening small continaners in sitting, but was unable to don his watch (pt reports this has very recenlty become more difficulty for him.)     Vision                     Perception     Praxis      Pertinent Vitals/Pain Pain Assessment: No/denies pain     Hand Dominance Right   Extremity/Trunk Assessment Upper Extremity Assessment Upper Extremity Assessment: Generalized weakness   Lower Extremity Assessment Lower Extremity Assessment: Defer to PT evaluation       Communication Communication Communication: No difficulties   Cognition Arousal/Alertness: Awake/alert Behavior During Therapy: WFL for tasks assessed/performed Overall Cognitive Status: No family/caregiver present to determine baseline cognitive functioning (is aware of being in the hospital in [place], that it is october 2015, and his own DOB.)                     General Comments  Exercises       Shoulder Instructions      Home Living Family/patient expects to be discharged to:: Skilled nursing facility Living Arrangements: Alone Available Help at Discharge: Family;Available PRN/intermittently Type of Home: House Home Access: Stairs to enter Enter[employer] CorporationEntrance Stairs-Number of Steps: 1   Home Layout: One level     Bathroom Shower/Tub: Chief Strategy OfficerTub/shower unit   Bathroom Toilet: Standard     Home Equipment: Cane - single point          Prior Functioning/Environment Level of Independence: Needs assistance  Gait / Transfers Assistance Needed: Per pt, he is mod (I) with bed mobility skills, transfers, and household amb with std cane in the Rt hand ADL's / Homemaking Assistance Needed: Per pt he has been able to dress and bathe himself, but it has been increasingly difficult lately.  His son completes all his driving, shopping tasks.  Pt reports he is able to microwave food and feed himself. Pt reprts and and son  together go to [employer] and that they eat out often, rather than preparing meals at home.          OT Diagnosis: Generalized weakness   OT Problem List: Decreased strength;Decreased coordination;Impaired balance (sitting and/or standing);Decreased safety awareness (Decreased ADL status)   OT Treatment/Interventions: Self-care/ADL training;Therapeutic exercise;Energy conservation;DME and/or AE instruction;Therapeutic activities;Patient/family education    OT Goals(Current goals can be found in the care plan section) Acute Rehab OT Goals Patient Stated Goal: none stated OT Goal Formulation: With patient Time For Goal Achievement: 08/30/14 Potential to Achieve Goals: Fair ADL Goals Pt Will Transfer to Toilet: with min guard assist;regular height toilet Pt Will Perform Toileting - Clothing Manipulation and hygiene: with min guard assist;sit to/from stand  OT Frequency: Min 2X/week   Barriers to D/C:            Co-evaluation              End of Session    Activity Tolerance: Patient tolerated treatment well Patient left: in bed;with call bell/phone within reach;with bed alarm set   Time: [id number] OT Time Calculation (min): 22 min Charges:  OT General Charges $OT Visit: 1 Procedure OT Evaluation $Initial OT Evaluation Tier I: 1 Procedure G-Codes:     Marry GuanMarie Rawlings Skarleth Delmonico, MS, OTR/L [facility] [phone] 08/16/2014, 10:04 AM

## 2014-08-16 NOTE — Progress Notes (Signed)
[id number] Noted pt's urine dark pink/red in color, denies any c/o pain or discomfort. Noted HGB-7.0, HCT-19.5 on 08/16/14, HGB-7.7, HCT-21.8 on 08/15/14. MD notified. No new orders at this time.

## 2014-08-16 NOTE — Clinical Social Work Placement (Signed)
Clinical Social Work Department CLINICAL SOCIAL WORK PLACEMENT NOTE 08/16/2014  [facility]  Account Number:  [id number] Admit date:  08/13/2014  Clinical Social Worker:  Derenda FennelKARA Arneta Mahmood, LCSW  Date/time:  08/16/2014 12:18 PM  Clinical Social Work is seeking post-discharge placement for this patient at the following level of care:   SKILLED NURSING   (*CSW will update this form in Epic as items are completed)   08/16/2014  Patient/family provided with [facility] Department of Clinical Social Work's list of facilities offering this level of care within the geographic area requested by the patient (or if unable, by the patient's family).  08/16/2014  Patient/family informed of their freedom to choose among providers that offer the needed level of care, that participate in Medicare, Medicaid or managed care program needed by the patient, have an available bed and are willing to accept the patient.  08/16/2014  Patient/family informed of [facility]' ownership interest in [facility], as well as of the fact that they are under no obligation to receive care at this facility.  PASARR submitted to EDS on 08/16/2014 PASARR number received on 08/16/2014  FL2 transmitted to all facilities in geographic area requested by pt/family on  08/16/2014 FL2 transmitted to all facilities within larger geographic area on   Patient informed that his/her managed care company has contracts with or will negotiate with  certain facilities, including the following:     Patient/family informed of bed offers received:   Patient chooses bed at  Physician recommends and patient chooses bed at    Patient to be transferred to  on   Patient to be transferred to facility by  Patient and family notified of transfer on  Name of family member notified:    The following physician request were entered in Epic:   Additional Comments:  Derenda FennelKara Durga Saldarriaga, LCSW [phone]

## 2014-08-16 NOTE — Progress Notes (Signed)
[facility] PROGRESS NOTE  Edwin PewCalvin [name] ZOX:[id number] DOB: 01/05/1951 DOA: 08/13/2014 PCP: No primary provider on file.  Assessment/Plan: Acute Encephalopathy -Multifactorial, but at this point appears to be most likely 2/2 severe nutritional deficiencies and UTI. -Continue vitamin B1/B12 replacement (thiamine deficiencies may explain his MRI findings).  -EEG pending.  Severe Nutritional Deficiencies/Severe Protein-Caloric Malnutrition/Adult FTT -B1/B12 supplementation. - MVI, Vit D Supplementation. -Suspect will be at risk for refeeding syndrome. -Will replete potassium, check phosphorus and magnesium in the morning.  Megaloblastic Anemia due to B12 deficiency -Hb 7.7. -No need for transfusion at this time. -No signs of active bleeding. -IM B12 supplementation. -Folic acid >20.  Right leg DVT -VQ negative for PE. -Continue eliquis for 6 months as per heme/onc recommendations.  UTI -Dirty UA but cx negative. -Will elect to treat with cipro PO for 5 days. -DC zosyn/vanc  Pancytopenia -Suspect related to nutritional deficiencies. -Counts continue to decrease. -Transfuse once hemoglobin below 7. Follow WBC and platelet count.  Code Status: Full Code Family Communication: Patient only . Left voice message for son [name]. Disposition Plan: To SNF, once medically ready.   Consultants:  Neurology, Dr. Gerilyn Pilgrimoonquah   Antibiotics:  Cipro   Subjective: Pleasantly confused. Although confusion seems a little improved today. He states he remembers me from yesterday.  Objective: Filed Vitals:   08/15/14 0607 08/15/14 1329 08/16/14 0022 08/16/14 0642  BP: 109/61 112/54 118/69 91/77  Pulse: 70 74 67 64  Temp: 98.8 F (37.1 C) 98.8 F (37.1 C) 98.4 F (36.9 C) 98.2 F (36.8 C)  TempSrc: Oral Oral Oral Oral  Resp: 20 19  17   Height:      Weight: 72.1 kg (158 lb 15.2 oz)   74.8 kg (164 lb 14.5 oz)  SpO2: 100% 100% 98% 99%    Intake/Output Summary (Last 24  hours) at 08/16/14 1247 Last data filed at 08/16/14 0919  Gross per 24 hour  Intake    590 ml  Output    775 ml  Net   -185 ml   Filed Weights   08/14/14 0558 08/15/14 0607 08/16/14 0642  Weight: 70.6 kg (155 lb 10.3 oz) 72.1 kg (158 lb 15.2 oz) 74.8 kg (164 lb 14.5 oz)    Exam:   General:  awake  Cardiovascular: RRR  Respiratory: CTA B  Abdomen: S/ND/NT/+BS  Extremities: no C/C/E/+pulses   Neurologic:  Non-focal  Data Reviewed: Basic Metabolic Panel:  Recent Labs Lab 08/13/14 2126 08/14/14 0559 08/15/14 0540 08/16/14 0248  NA 140 140 139 140  K 4.0 4.1 3.6* 3.3*  CL 100 103 104 105  CO2 25 26 26 25   GLUCOSE 83 80 91 102*  BUN 32* 29* 20 12  CREATININE 0.92 0.86 0.98 0.90  CALCIUM 9.4 8.4 8.5 8.2*   Liver Function Tests:  Recent Labs Lab 08/13/14 2126 08/14/14 0559 08/15/14 0540  AST 14 13 12   ALT 7 6 6   ALKPHOS 49 42 37*  BILITOT 2.2* 1.7* 1.2  PROT 7.1 6.0 5.7*  ALBUMIN 3.7 3.1* 3.0*   No results found for this basename: LIPASE, AMYLASE,  in the last 168 hours  Recent Labs Lab 08/14/14 0008  AMMONIA 21   CBC:  Recent Labs Lab 08/13/14 2126 08/14/14 0559 08/14/14 1531 08/15/14 0540 08/16/14 0248  WBC 4.2 3.1* 3.4* 3.1* 2.7*  NEUTROABS 2.7 1.4*  --  1.8  --   HGB 9.0* 7.7* 9.0* 7.7* 7.0*  HCT 25.6* 22.5* 25.7* 21.8* 19.5*  MCV 114.8* 115.4*  116.3* 114.1* 114.0*  PLT 137* 111* 123* 111* 99*   Cardiac Enzymes:  Recent Labs Lab 08/13/14 2126 08/14/14 0041 08/14/14 0559 08/14/14 1204  TROPONINI <0.30 <0.30 <0.30 <0.30   BNP (last 3 results)  Recent Labs  08/13/14 2224  PROBNP 82.8   CBG: No results found for this basename: GLUCAP,  in the last 168 hours  Recent Results (from the past 240 hour(s))  URINE CULTURE     Status: None   Collection Time    08/13/14 11:02 PM      Result Value Ref Range Status   Specimen Description URINE, CATHETERIZED   Final   Special Requests NONE   Final   Culture  Setup Time     Final    Value: 08/14/2014 13:59     Performed at [facility] Count     Final   Value: NO GROWTH     Performed at [facility]   Culture     Final   Value: NO GROWTH     Performed at [facility]   Report Status 08/15/2014 FINAL   Final  CULTURE, BLOOD (ROUTINE X 2)     Status: None   Collection Time    08/14/14 12:13 AM      Result Value Ref Range Status   Specimen Description BLOOD LEFT ANTECUBITAL   Final   Special Requests     Final   Value: BOTTLES DRAWN AEROBIC AND ANAEROBIC AEB=2CC ANA=6CC   Culture NO GROWTH 2 DAYS   Final   Report Status PENDING   Incomplete  CULTURE, BLOOD (ROUTINE X 2)     Status: None   Collection Time    08/14/14 12:13 AM      Result Value Ref Range Status   Specimen Description BLOOD LEFT HAND   Final   Special Requests     Final   Value: BOTTLES DRAWN AEROBIC AND ANAEROBIC AEB=6CC ANA=2CC   Culture NO GROWTH 2 DAYS   Final   Report Status PENDING   Incomplete     Studies: Dg Chest 2 View  08/15/2014   CLINICAL DATA:  Deep venous thrombosis.  EXAM: CHEST  2 VIEW  COMPARISON:  August 13, 2014.  FINDINGS: The heart size and mediastinal contours are within normal limits. Both lungs are clear. No pneumothorax is noted. Minimal bilateral pleural effusions are noted posteriorly. The visualized skeletal structures are unremarkable.  IMPRESSION: Minimal bilateral pleural effusions. No other significant abnormality seen in the chest.   Electronically Signed   By: Roque Lias M.D.   On: 08/15/2014 10:51   Nm Pulmonary Perf And Vent  08/15/2014   CLINICAL DATA:  Altered mental status. No current shortness of breath or chest pain.  EXAM: NUCLEAR MEDICINE VENTILATION - PERFUSION LUNG SCAN  TECHNIQUE: Ventilation images were obtained in multiple projections using inhaled aerosol technetium 99 M DTPA. Perfusion images were obtained in multiple projections after intravenous injection of Tc-55m MAA.  RADIOPHARMACEUTICALS:  40.0 mCi  Tc-74m DTPA aerosol and 6.0 mCi Tc-2m MAA  COMPARISON:  Chest radiograph, 08/15/2014.  FINDINGS: Ventilation: No focal ventilation defect.  Perfusion: No wedge shaped peripheral perfusion defects to suggest acute pulmonary embolism.  IMPRESSION: No evidence of a pulmonary embolism.   Electronically Signed   By: Amie Portland M.D.   On: 08/15/2014 11:45   US Venous Img Lower Unilateral Right  08/14/2014   CLINICAL DATA:  Right lower extremity pain and edema. Evaluate for DVT.  EXAM: RIGHT LOWER EXTREMITY VENOUS DOPPLER ULTRASOUND  TECHNIQUE: Gray-scale sonography with graded compression, as well as color Doppler and duplex ultrasound were performed to evaluate the lower extremity deep venous systems from the level of the common femoral vein and including the common femoral, femoral, profunda femoral, popliteal and calf veins including the posterior tibial, peroneal and gastrocnemius veins when visible. The superficial great saphenous vein was also interrogated. Spectral Doppler was utilized to evaluate flow at rest and with distal augmentation maneuvers in the common femoral, femoral and popliteal veins.  COMPARISON:  None.  FINDINGS: There is a minimal amount of eccentric mixed echogenic nonocclusive thrombus within right common femoral vein (images 2 and 4). Nonocclusive thrombus extends to involve the imaged portions of the proximal (images 13 and 14), mid (images 16 and 17) and distal (images 21 and 22) aspects of the right superficial femoral vein.  The right deep femoral and popliteal veins are widely patent. The greater saphenous vein appears widely patent throughout its imaged course.  Incidental note is made of a serpiginous approximately 2.8 x 0.7 cm fluid collection within the right popliteal fossa favored to represent Baker's cyst.  There is subcutaneous edema at the level of the calf. Note is made of two benign appearing right inguinal lymph nodes which are not enlarged by size criteria and maintain  benign fatty hila.  IMPRESSION: 1. Examination is positive for nonocclusive thrombus extending from the level of the right common femoral vein to the distal aspect of the right superficial femoral vein. 2. Incidental note made of an approximately 2.8 cm right-sided Baker cyst. These results will be called to the ordering clinician or representative by the Radiologist Assistant, and communication documented in the PACS or zVision Dashboard.   Electronically Signed   By: Simonne ComeJohn  Watts M.D.   On: 08/14/2014 17:15    Scheduled Meds: . apixaban  10 mg Oral BID  . [START ON 08/22/2014] apixaban  5 mg Oral BID  . cholecalciferol  400 Units Oral Daily  . ciprofloxacin  500 mg Oral BID  . cyanocobalamin  1,000 mcg Intramuscular Daily  . feeding supplement (ENSURE COMPLETE)  237 mL Oral BID BM  . folic acid  1 mg Oral Daily  . pantoprazole  40 mg Oral Daily  . prenatal multivitamin  1 tablet Oral Q1200  . thiamine  100 mg Intravenous Daily   Continuous Infusions: . sodium chloride 70 mL/hr at 08/15/14 2342    Principal Problem:   Acute encephalopathy Active Problems:   Adult failure to thrive   UTI (lower urinary tract infection)   Pancytopenia   Severe protein-calorie malnutrition   Vitamin B12 deficiency   Right leg DVT   Megaloblastic anemia due to B12 deficiency   Vitamin D deficiency    Time spent: 35 minutes. Greater than 50% of this time was spent in direct contact with the patient coordinating care.    Chaya JanHERNANDEZ ACOSTA,ESTELA  [facility] Pager [phone]  If 7PM-7AM, please contact night-coverage at [email or web address], password [email or web address] 08/16/2014, 12:47 PM  LOS: 3 days

## 2014-08-16 NOTE — Progress Notes (Signed)
Physical Therapy Treatment Patient Details Name: Edwin [name] MRN: [id number] DOB: 12/21/1950 Today's Date: 08/16/2014    History of Present Illness This is a 63 y.o. year old male with no prior medical care  presenting with encephalopathy and failure to thrive. Family states that pt lives alone. He has never seen a doctor per pt and family. Family states that pt has had progressive weakness, decreased appetite, confusion over past 2 months. No known prior history of medical problems. Pt denies any ETOH or tobacco abuse. Does report progressive weight loss and decreased appetite over extended period of time. Per report, pt was found covered in his own fecal material and urine  at home today by EMS    PT Comments    Pt/family agreeable to treatment; son present during PT treatment session.  Pt reports he is feeling good, without complaints of pain today.  Pt able to appropriately give name, DOB, month, year, president, and city without limitations today.  Noted improved gait dsitance today, though continued to require min guard/min assist and use of RW secondary to fair standing balance.  Gait pattern appears abnormal today with differing step length throughout.  Pt did require VC for turning RW (turned away from room to back in vs. Turning walker to walk into room and with turning walker to stand pivot transferring to the bed) which son states is abnormal for pt.  Prior to this that patient was (I) with all mobility skills without use of AD or VC for technique.  Son reports he works and will be unable to provide 24/7 assist that pt will need secondary to balance deficits.  Updated d/c recommendation for SNF rehab to address balance and improve functional mobility skills for safe return home; case management made aware.    Follow Up Recommendations  SNF     Equipment Recommendations  Rolling walker with 5" wheels       Precautions / Restrictions Precautions Precautions: Fall Restrictions Weight  Bearing Restrictions: No    Mobility  Bed Mobility Overal bed mobility: Modified Independent                Transfers Overall transfer level: Needs assistance Equipment used: Rolling walker (2 wheeled) Transfers: Sit to/from BJ'sStand;Stand Pivot Transfers Sit to Stand: Min guard Stand pivot transfers: Min guard       General transfer comment: Min guard assist secondary to poo-fair sanding balance  Ambulation/Gait Ambulation/Gait assistance: Min guard;Min assist Ambulation Distance (Feet): 20 Feet Assistive device: Rolling walker (2 wheeled) Gait Pattern/deviations: Step-through pattern;Decreased stride length;Decreased dorsiflexion - right;Decreased dorsiflexion - left   Gait velocity interpretation: Below normal speed for age/gender General Gait Details: Fair standing balance requiring use of RW and min assist to maintain balance during gait.           Cognition Arousal/Alertness: Awake/alert Behavior During Therapy: WFL for tasks assessed/performed Overall Cognitive Status: Within Functional Limits for tasks assessed                             Pertinent Vitals/Pain Pain Assessment: No/denies pain    Home Living Family/patient expects to be discharged to:: Skilled nursing facility Living Arrangements: Alone Available Help at Discharge: Family;Available PRN/intermittently Type of Home: House Home Access: Stairs to enter   Home Layout: One level Home Equipment: Gilmer Morane - single point      Prior Function Level of Independence: Needs assistance  Gait / Transfers Assistance Needed: Per pt, he is  mod (I) with bed mobility skills, transfers, and household amb with std cane in the Rt hand ADL's / Homemaking Assistance Needed: Per pt he has been able to dress and bathe himself, but it has been increasingly difficult lately.  His son completes all his driving, shopping tasks.  Pt reports he is able to microwave food and feed himself. Pt reprts and and son  [place] and that they eat out often, rather than preparing meals at home.       PT Goals (current goals can now be found in the care plan section) Acute Rehab PT Goals Patient Stated Goal: none stated Progress towards PT goals: Progressing toward goals    Frequency  Min 3X/week    PT Plan Current plan remains appropriate;Discharge plan needs to be updated       End of Session Equipment Utilized During Treatment: Gait belt Activity Tolerance: Patient limited by fatigue Patient left: in bed;with call bell/phone within reach;with bed alarm set;with family/visitor present     Time: [id number] PT Time Calculation (min): 18 min  Charges:  $Gait Training: 8-22 mins                     [name] 08/16/2014, 12:00 PM

## 2014-08-16 NOTE — Progress Notes (Addendum)
Received call from RN at 8:45 pm.   Patient with acute DVT on eliquis having significant bleeding from his penis. Will check a stat CBC. Stop eliquis start heparin per pharmacy.  If patient's HGB is less than 7.0 will order 1 unit transfused.  Algis DownsMarianne York, PA-C [facility] Pager: [phone]   9:35 pm - patient hgb at 7.1.  Spoke with Charity fundraiserN at [facility]  She reports the patient is actively bleeding red blood with little urine in it.  Will transfuse 1 unit.

## 2014-08-16 NOTE — Clinical Social Work Psychosocial (Signed)
Clinical Social Work Department BRIEF PSYCHOSOCIAL ASSESSMENT 08/16/2014  Patient:  [facility]     Account Number:  [id number]     Admit date:  08/13/2014  Clinical Social Worker:  Wyatt Haste  Date/Time:  08/16/2014 12:21 PM  Referred by:  CSW  Date Referred:  08/16/2014 Referred for  SNF Placement   Other Referral:   Interview type:  Family Other interview type:   son- Radin    PSYCHOSOCIAL DATA Living Status:  ALONE Admitted from facility:   Level of care:   Primary support name:  Sloane Primary support relationship to patient:  CHILD, ADULT Degree of support available:   supportive    CURRENT CONCERNS Current Concerns  Post-Acute Placement   Other Concerns:    SOCIAL WORK ASSESSMENT / PLAN CSW met with pt's son, Coyle as pt was getting hair cut. Herby reports pt lives alone. Family is involved and supportive. Gussie visits pt almost daily. He states that they had been trying to convince pt to go to a doctor/hospital for some time but pt was refusing. Pt finally agreed as he was so weak and was brought to ED for evaluation. For the past 3 months, pt has had to have more help from family. Braelon indicates that this happended rather suddenly as he was doing yardwork and completely independent prior to this. Pt has had some baseline confusion per family. Son has been concerned about him living alone for the past few months. PT/OT evaluated pt and recommendation is for SNF. Trevar feels this would be best, and said that he has discussed with pt. Pt was initially against the idea, but Jovonte reports he is not agreeable after explanation that it was for rehab. CSW will initiate bed search in [facility].   Assessment/plan status:  Psychosocial Support/Ongoing Assessment of Needs Other assessment/ plan:   Information/referral to community resources:   SNF list    PATIENT'S/FAMILY'S RESPONSE TO PLAN OF CARE: CSW will follow up with pt/son regarding placement.        Benay Pike, [place]

## 2014-08-17 DIAGNOSIS — R319 Hematuria, unspecified: Secondary | ICD-10-CM

## 2014-08-17 DIAGNOSIS — E440 Moderate protein-calorie malnutrition: Secondary | ICD-10-CM | POA: Insufficient documentation

## 2014-08-17 LAB — BETA 2 MICROGLOBULIN, SERUM: Beta-2 Microglobulin: 2.36 mg/L — ABNORMAL HIGH (ref <=2.51)

## 2014-08-17 LAB — CBC
HCT: 21.4 % — ABNORMAL LOW (ref 39.0–52.0)
HCT: 23.2 % — ABNORMAL LOW (ref 39.0–52.0)
Hemoglobin: 7.6 g/dL — ABNORMAL LOW (ref 13.0–17.0)
Hemoglobin: 8 g/dL — ABNORMAL LOW (ref 13.0–17.0)
MCH: 38.4 pg — ABNORMAL HIGH (ref 26.0–34.0)
MCH: 37.6 pg — ABNORMAL HIGH (ref 26.0–34.0)
MCHC: 35.5 g/dL (ref 30.0–36.0)
MCHC: 34.5 g/dL (ref 30.0–36.0)
MCV: 108.1 fL — ABNORMAL HIGH (ref 78.0–100.0)
MCV: 108.9 fL — ABNORMAL HIGH (ref 78.0–100.0)
Platelets: 122 10*3/uL — ABNORMAL LOW (ref 150–400)
Platelets: 123 10*3/uL — ABNORMAL LOW (ref 150–400)
RBC: 1.98 MIL/uL — AB (ref 4.22–5.81)
RBC: 2.13 MIL/uL — ABNORMAL LOW (ref 4.22–5.81)
RDW: 23.8 % — ABNORMAL HIGH (ref 11.5–15.5)
RDW: 25 % — ABNORMAL HIGH (ref 11.5–15.5)
WBC: 5.1 10*3/uL (ref 4.0–10.5)
WBC: 6.1 10*3/uL (ref 4.0–10.5)

## 2014-08-17 LAB — URINE CULTURE
CULTURE: NO GROWTH
Colony Count: NO GROWTH

## 2014-08-17 LAB — MULTIPLE MYELOMA PANEL, SERUM
ALBUMIN ELP: 54.7 % — AB (ref 55.8–66.1)
ALPHA-2-GLOBULIN: 7.4 % (ref 7.1–11.8)
Alpha-1-Globulin: 5.7 % — ABNORMAL HIGH (ref 2.9–4.9)
BETA 2: 5.8 % (ref 3.2–6.5)
BETA GLOBULIN: 5.7 % (ref 4.7–7.2)
GAMMA GLOBULIN: 20.7 % — AB (ref 11.1–18.8)
IGM, SERUM: 83 mg/dL (ref 41–251)
IgA: 321 mg/dL (ref 68–379)
IgG (Immunoglobin G), Serum: 1080 mg/dL (ref 650–1600)
M-Spike, %: NOT DETECTED g/dL
Total Protein: 6.3 g/dL (ref 6.0–8.3)

## 2014-08-17 LAB — BASIC METABOLIC PANEL
Anion gap: 7 (ref 5–15)
BUN: 7 mg/dL (ref 6–23)
CHLORIDE: 109 meq/L (ref 96–112)
CO2: 26 meq/L (ref 19–32)
Calcium: 8.7 mg/dL (ref 8.4–10.5)
Creatinine, Ser: 1 mg/dL (ref 0.50–1.35)
GFR calc non Af Amer: 77 mL/min — ABNORMAL LOW (ref >90)
GFR, EST AFRICAN AMERICAN: 89 mL/min — AB (ref >90)
Glucose, Bld: 112 mg/dL — ABNORMAL HIGH (ref 70–99)
POTASSIUM: 4 meq/L (ref 3.7–5.3)
Sodium: 142 mEq/L (ref 137–147)

## 2014-08-17 LAB — LUPUS ANTICOAGULANT PANEL
DRVVT: 33.1 s (ref <42.9)
Lupus Anticoagulant: NOT DETECTED
PTT LA: 45.7 s — AB (ref 28.0–43.0)
PTTLA 41 MIX: 53.6 s — AB (ref 28.0–43.0)
PTTLA Confirmation: 2.3 secs (ref <8.0)

## 2014-08-17 LAB — CARDIOLIPIN ANTIBODIES, IGG, IGM, IGA
ANTICARDIOLIPIN IGA: 10 U/mL — AB (ref <22)
Anticardiolipin IgG: 5 GPL U/mL — ABNORMAL LOW (ref <23)
Anticardiolipin IgM: 2 MPL U/mL — ABNORMAL LOW (ref <11)

## 2014-08-17 LAB — VITAMIN B1: Vitamin B1 (Thiamine): <7 nmol/L — ABNORMAL LOW (ref 8–30)

## 2014-08-17 LAB — ERYTHROPOIETIN: Erythropoietin: 29.4 m[IU]/mL — ABNORMAL HIGH (ref 2.6–18.5)

## 2014-08-17 LAB — PHOSPHORUS: PHOSPHORUS: 2.1 mg/dL — AB (ref 2.3–4.6)

## 2014-08-17 LAB — PROTIME-INR
INR: 1.66 — AB (ref 0.00–1.49)
PROTHROMBIN TIME: 19.7 s — AB (ref 11.6–15.2)

## 2014-08-17 LAB — BETA-2-GLYCOPROTEIN I ABS, IGG/M/A
BETA 2 GLYCO I IGG: 1 G Units (ref <20)
BETA-2-GLYCOPROTEIN I IGA: 7 A Units (ref <20)
Beta-2-Glycoprotein I IgM: 8 M Units (ref <20)

## 2014-08-17 LAB — METHYLMALONIC ACID, SERUM: Methylmalonic Acid, Quantitative: 9890 nmol/L — ABNORMAL HIGH (ref 87–318)

## 2014-08-17 LAB — APTT: APTT: 76 s — AB (ref 24–37)

## 2014-08-17 LAB — HEPARIN LEVEL (UNFRACTIONATED)

## 2014-08-17 LAB — MAGNESIUM: Magnesium: 2 mg/dL (ref 1.5–2.5)

## 2014-08-17 MED ORDER — OXYCODONE-ACETAMINOPHEN 5-325 MG PO TABS
1.0000 | ORAL_TABLET | ORAL | Status: DC | PRN
Start: 2014-08-17 — End: 2014-08-22
  Administered 2014-08-19: 1 via ORAL
  Filled 2014-08-17: qty 1

## 2014-08-17 NOTE — Progress Notes (Signed)
Physical Therapy Treatment Patient Details Name: Nance PewCalvin Sizer MRN: [id number] DOB: 05/23/1951 Today's Date: 08/17/2014    History of Present Illness This is a 63 y.o. year old male with no prior medical care  presenting with encephalopathy and failure to thrive. Family states that pt lives alone. He has never seen a doctor per pt and family. Family states that pt has had progressive weakness, decreased appetite, confusion over past 2 months. No known prior history of medical problems. Pt denies any ETOH or tobacco abuse. Does report progressive weight loss and decreased appetite over extended period of time. Per report, pt was found covered in his own fecal material and urine  at home today by EMS    PT Comments    Pt continues to demonstrate poor balance with mobility. Pt is a high fall risk. Pt will continue to benefit from acute PT until d.c to SNF for further therapy.   Follow Up Recommendations  SNF     Equipment Recommendations       Recommendations for Other Services       Precautions / Restrictions Precautions Precautions: Fall Precaution Comments: poor balance Restrictions Weight Bearing Restrictions: No    Mobility  Bed Mobility Overal bed mobility: Modified Independent                Transfers Overall transfer level: Needs assistance Equipment used: Rolling walker (2 wheeled) Transfers: Sit to/from Stand Sit to Stand: Min assist Stand pivot transfers: Min assist       General transfer comment: verbal cues for technique and hand placement. Poor balance and slow to process information.  Ambulation/Gait Ambulation/Gait assistance: Min assist Ambulation Distance (Feet): 70 Feet Assistive device: Rolling walker (2 wheeled) Gait Pattern/deviations: Step-through pattern;Decreased stride length;Scissoring Gait velocity: decreased Gait velocity interpretation: Below normal speed for age/gender General Gait Details: Pt's assistance varies throughout walk. Pt  very unstable and required therapist to support him to mailntain balance and prevent a fall.   Stairs            Wheelchair Mobility    Modified Rankin (Stroke Patients Only)       Balance Overall balance assessment: Needs assistance Sitting-balance support: No upper extremity supported;Feet supported Sitting balance-Leahy Scale: Good   Postural control: Right lateral lean Standing balance support: Bilateral upper extremity supported Standing balance-Leahy Scale: Poor                      Cognition Arousal/Alertness: Awake/alert Behavior During Therapy: Flat affect Overall Cognitive Status: No family/caregiver present to determine baseline cognitive functioning                      Exercises      General Comments        Pertinent Vitals/Pain Pain Assessment: No/denies pain    Home Living                      Prior Function            PT Goals (current goals can now be found in the care plan section) Progress towards PT goals: Progressing toward goals    Frequency  Min 3X/week    PT Plan Current plan remains appropriate    Co-evaluation             End of Session Equipment Utilized During Treatment: Gait belt Activity Tolerance: Patient limited by fatigue Patient left: in chair;with call bell/phone within reach;with chair alarm set  Time: 1110-1130 PT Time Calculation (min): 20 min  Charges:  $Gait Training: 8-22 mins                    G Codes:      Greggory StallionWrisley, Lilja Soland Kerstine 08/17/2014, 11:37 AM

## 2014-08-17 NOTE — Clinical Social Work Note (Signed)
CSW met with pt to discuss SNF. Pt is open to placement and thinks he would prefer to be in [place] at [facility], but requested his son's opinion as well. CSW spoke with son, Yee who agrees. Notified [place] of possible weekend d/c and they are agreeable.   Benay Pike, [place]

## 2014-08-17 NOTE — Progress Notes (Signed)
Occupational Therapy Treatment Patient Details Name: Edwin PewCalvin Wrinkle MRN: [id number] DOB: 08/11/1951 Today's Date: 08/17/2014    History of present illness This is a 63 y.o. year old male with no prior medical care  presenting with encephalopathy and failure to thrive. Family states that pt lives alone. He has never seen a doctor per pt and family. Family states that pt has had progressive weakness, decreased appetite, confusion over past 2 months. No known prior history of medical problems. Pt denies any ETOH or tobacco abuse. Does report progressive weight loss and decreased appetite over extended period of time. Per report, pt was found covered in his own fecal material and urine  at home today by EMS   OT comments  Pt with decreased standing balance this session. Preformed morning ADLs while standing at the sink - pt required consistent cueing to decrease right lean and maintain upright posture.  With cueing pt could momentarily correct posture, but required assist throughout standing activites to maintain balance. When asked about balance, pt could not verbalize feeling off balance or unsafe.  Pt able to preform task of brushing his teeth with extra time, verbal assist for wrappers, and physical assist for managing sensor on sink, but required mod assist to maintain standing balance at sink. Pt will continue to benefit from OT services - d/c plan to SNF remains appropriate.   Follow Up Recommendations  SNF    Equipment Recommendations  None recommended by OT    Recommendations for Other Services      Precautions / Restrictions Precautions Precautions: Fall Restrictions Weight Bearing Restrictions: No       Mobility Bed Mobility Overal bed mobility: Modified Independent                Transfers Overall transfer level: Needs assistance Equipment used: Rolling walker (2 wheeled) Transfers: Sit to/from [employer]Stand;Stand Pivot Transfers Sit to Stand: Min assist Stand pivot  transfers: Min assist       General transfer comment: min assist required this session secondary to poor standing balance.    Balance Overall balance assessment: Needs assistance Sitting-balance support: No upper extremity supported;Feet supported Sitting balance-Leahy Scale: Good   Postural control: Right lateral lean Standing balance support: During functional activity;Bilateral upper extremity supported Standing balance-Leahy Scale: Poor                     ADL   Eating/Feeding: Set up Eating/Feeding Details (indicate cue type and reason): verbal cueing needed for opening juice, using straw Grooming: Standing;Cueing for safety;Moderate assistance Grooming Details (indicate cue type and reason): Standingiwth pat at sink for morning ADLs.  Pt poor standing balance during functional activites.  Continuous cueing needed for upright posture and safety. Pt verbalized being unaware of poor balance.  pt was able to othen toothbrush, open toothpaste, put paste on brush, and brush teeth wtih assist for balance and cueing for safety techniques.                                      Vision                     Perception     Praxis      Cognition   Behavior During Therapy: [facility] for tasks assessed/performed Overall Cognitive Status: Within Functional Limits for tasks assessed  Extremity/Trunk Assessment               Exercises     Shoulder Instructions       General Comments      Pertinent Vitals/ Pain       Pain Assessment: No/denies pain  Home Living                                          Prior Functioning/Environment              Frequency Min 2X/week     Progress Toward Goals  OT Goals(current goals can now be found in the care plan section)  Progress towards OT goals: Progressing toward goals     Plan Discharge plan remains appropriate    Co-evaluation                  End of Session Equipment Utilized During Treatment: Gait belt;Rolling walker   Activity Tolerance Patient tolerated treatment well   Patient Left in chair;with call bell/phone within reach;with chair alarm set   Nurse Communication          Time: [phone] OT Time Calculation (min): 31 min  Charges: OT General Charges $OT Visit: 1 Procedure OT Treatments $Self Care/Home Management : 23-37 mins  Ilda BassetMarie Rawlings Yukari Flax, MS, OTR/L [facility] [phone] 08/17/2014, 9:25 AM

## 2014-08-17 NOTE — Progress Notes (Signed)
[facility] PROGRESS NOTE  Nance PewCalvin Deveney ZOX:[id number] DOB: 01/31/1951 DOA: 08/13/2014 PCP: No primary provider on file.  Assessment/Plan: Acute Encephalopathy -Multifactorial, but at this point appears to be most likely 2/2 severe nutritional deficiencies and UTI. -Continue vitamin B1/B12 replacement (thiamine deficiencies may explain his MRI findings).  -EEG pending. -Improved.  Severe Nutritional Deficiencies/Severe Protein-Caloric Malnutrition/Adult FTT -B1/B12 supplementation. - MVI, Vit D Supplementation. -Will replete potassium, check phosphorus and magnesium in the morning.  Megaloblastic Anemia due to B12 deficiency/ABLA -Now with hematuria. -Received 1 unit of PRBCs overnight. -Eliquis was transitioned over to heparin drip overnight. -If continues to bleed, may need to consider DC all anticoagulation for now. -IM B12 supplementation. -Folic acid >20.  Hematuria -See above for details.  Right leg DVT -VQ negative for PE. -Currently on heparin drip.  UTI -Dirty UA but cx negative. -Will elect to treat with cipro PO for 5 days. -DC zosyn/vanc  Pancytopenia -Suspect related to nutritional deficiencies. -WBC and platelets rising.. -Hb dropping due to hematuria.  Code Status: Full Code Family Communication: Patient only . Left voice message for son [name]. Disposition Plan: To SNF, once medically ready.   Consultants:  Neurology, Dr. Gerilyn Pilgrimoonquah   Antibiotics:  Cipro   Subjective: Pleasantly confused. Although confusion seems a little improved today. He states he remembers me from yesterday.  Objective: Filed Vitals:   08/16/14 2028 08/17/14 0000 08/17/14 0016 08/17/14 0319  BP: 122/58 128/78 144/88 137/56  Pulse: 80 84 82 75  Temp: 99.1 F (37.3 C) 99.2 F (37.3 C) 99.1 F (37.3 C) 98.8 F (37.1 C)  TempSrc: Oral Oral Oral Oral  Resp: 18 18 18    Height:      Weight:      SpO2: 97% 100% 98% 99%    Intake/Output Summary (Last 24  hours) at 08/17/14 1300 Last data filed at 08/17/14 1243  Gross per 24 hour  Intake   2130 ml  Output   2500 ml  Net   -370 ml   Filed Weights   08/14/14 0558 08/15/14 0607 08/16/14 0642  Weight: 70.6 kg (155 lb 10.3 oz) 72.1 kg (158 lb 15.2 oz) 74.8 kg (164 lb 14.5 oz)    Exam:   General:  awake  Cardiovascular: RRR  Respiratory: CTA B  Abdomen: S/ND/NT/+BS  Extremities: no C/C/E/+pulses   Neurologic:  Non-focal  Data Reviewed: Basic Metabolic Panel:  Recent Labs Lab 08/13/14 2126 08/14/14 0559 08/15/14 0540 08/16/14 0248 08/17/14 0805  NA 140 140 139 140 142  K 4.0 4.1 3.6* 3.3* 4.0  CL 100 103 104 105 109  CO2 25 26 26 25 26   GLUCOSE 83 80 91 102* 112*  BUN 32* 29* 20 12 7   CREATININE 0.92 0.86 0.98 0.90 1.00  CALCIUM 9.4 8.4 8.5 8.2* 8.7  MG  --   --   --   --  2.0  PHOS  --   --   --   --  2.1*   Liver Function Tests:  Recent Labs Lab 08/13/14 2126 08/14/14 0559 08/15/14 0540 08/15/14 1353  AST 14 13 12   --   ALT 7 6 6   --   ALKPHOS 49 42 37*  --   BILITOT 2.2* 1.7* 1.2  --   PROT 7.1 6.0 5.7* 6.3  ALBUMIN 3.7 3.1* 3.0*  --    No results found for this basename: LIPASE, AMYLASE,  in the last 168 hours  Recent Labs Lab 08/14/14 0008  AMMONIA 21  CBC:  Recent Labs Lab 08/13/14 2126 08/14/14 0559 08/14/14 1531 08/15/14 0540 08/16/14 0248 08/16/14 2042 08/17/14 0805  WBC 4.2 3.1* 3.4* 3.1* 2.7* 4.5 5.1  NEUTROABS 2.7 1.4*  --  1.8  --   --   --   HGB 9.0* 7.7* 9.0* 7.7* 7.0* 7.1* 7.6*  HCT 25.6* 22.5* 25.7* 21.8* 19.5* 19.8* 21.4*  MCV 114.8* 115.4* 116.3* 114.1* 114.0* 113.8* 108.1*  PLT 137* 111* 123* 111* 99* 117* 122*   Cardiac Enzymes:  Recent Labs Lab 08/13/14 2126 08/14/14 0041 08/14/14 0559 08/14/14 1204  TROPONINI <0.30 <0.30 <0.30 <0.30   BNP (last 3 results)  Recent Labs  08/13/14 2224  PROBNP 82.8   CBG: No results found for this basename: GLUCAP,  in the last 168 hours  Recent Results (from the  past 240 hour(s))  URINE CULTURE     Status: None   Collection Time    08/13/14 11:02 PM      Result Value Ref Range Status   Specimen Description URINE, CATHETERIZED   Final   Special Requests NONE   Final   Culture  Setup Time     Final   Value: 08/14/2014 13:59     [facility]formed at [facility]   Colony Count     Final   Value: NO GROWTH     [facility]formed at [facility]   Culture     Final   Value: NO GROWTH     [facility]formed at [facility]   Report Status 08/15/2014 FINAL   Final  CULTURE, BLOOD (ROUTINE X 2)     Status: None   Collection Time    08/14/14 12:13 AM      Result Value Ref Range Status   Specimen Description BLOOD LEFT ANTECUBITAL   Final   Special Requests     Final   Value: BOTTLES DRAWN AEROBIC AND ANAEROBIC AEB=2CC ANA=6CC   Culture NO GROWTH 2 DAYS   Final   Report Status PENDING   Incomplete  CULTURE, BLOOD (ROUTINE X 2)     Status: None   Collection Time    08/14/14 12:13 AM      Result Value Ref Range Status   Specimen Description BLOOD LEFT HAND   Final   Special Requests     Final   Value: BOTTLES DRAWN AEROBIC AND ANAEROBIC AEB=6CC ANA=2CC   Culture NO GROWTH 2 DAYS   Final   Report Status PENDING   Incomplete     Studies: No results found.  Scheduled Meds: . sodium chloride   Intravenous Once  . sodium chloride   Intravenous Once  . cholecalciferol  400 Units Oral Daily  . ciprofloxacin  500 mg Oral BID  . cyanocobalamin  1,000 mcg Intramuscular Daily  . feeding supplement (ENSURE COMPLETE)  237 mL Oral BID BM  . folic acid  1 mg Oral Daily  . pantoprazole  40 mg Oral Daily  . prenatal multivitamin  1 tablet Oral Q1200  . thiamine  100 mg Intravenous Daily   Continuous Infusions: . sodium chloride 70 mL/hr at 08/16/14 1511  . heparin 1,000 Units/hr (08/17/14 0131)    Principal Problem:   Acute encephalopathy Active Problems:   Adult failure to thrive   UTI (lower urinary tract infection)   Pancytopenia   Severe  protein-calorie malnutrition   Vitamin B12 deficiency   Right leg DVT   Megaloblastic anemia due to B12 deficiency   Vitamin D deficiency   Malnutrition  of moderate degree    Time spent: 35 minutes. Greater than 50% of this time was spent in direct contact with the patient coordinating care.    Chaya Jan  [facility] Pager [phone]  If 7PM-7AM, please contact night-coverage at [email or web address], password [facility] 08/17/2014, 1:00 PM  LOS: 4 days

## 2014-08-17 NOTE — Procedures (Signed)
  [facility] Edwin [name] A. Gerilyn Pilgrimoonquah, MD     [email or web address]           HISTORY: AMS- Possible seizures.   MEDICATIONS: Scheduled Meds: . sodium chloride   Intravenous Once  . sodium chloride   Intravenous Once  . cholecalciferol  400 Units Oral Daily  . ciprofloxacin  500 mg Oral BID  . cyanocobalamin  1,000 mcg Intramuscular Daily  . feeding supplement (ENSURE COMPLETE)  237 mL Oral BID BM  . folic acid  1 mg Oral Daily  . pantoprazole  40 mg Oral Daily  . prenatal multivitamin  1 tablet Oral Q1200  . thiamine  100 mg Intravenous Daily   Continuous Infusions: . sodium chloride 70 mL/hr at 08/16/14 1511  . heparin 1,000 Units/hr (08/17/14 0131)   PRN Meds:.ondansetron (ZOFRAN) IV, oxyCODONE-acetaminophen  Prior to Admission medications   Not on File      ANALYSIS: A 16 channel recording using standard 10 20 measurements is conducted for 20 minutes. There is a posterior rhythym of 8HZ . Awake and sleep activity are seen. Photic stimulation and HV were not done. No focal or lateral slowing. No epileptiform activity.    IMPRESSION: 1. Mild generalized slowing for age. Otherwise, unremarkable.      [name] A. Gerilyn Pilgrimoonquah, M.D.  Diplomate, Biomedical engineerAmerican Board of Psychiatry and Neurology ( Neurology).

## 2014-08-17 NOTE — Progress Notes (Signed)
Spoke to MD and pharmacist on call about restarting patient's heparin. MD was okay with restarting heparin, but told nurse to speak with pharmacist to verify.   Pharmacist explained that heparin levels may be "off" due to dose of eliquis.  Patient is still having bleeding from penis, but is currently receiving one unit of blood.  Will continue to monitor patient.

## 2014-08-17 NOTE — Progress Notes (Signed)
Patient refused am labs this am.   Patient stated he was tired of being stuck.  Will reschedule am labs for later this am.

## 2014-08-17 NOTE — Clinical Social Work Placement (Signed)
Clinical Social Work Department CLINICAL SOCIAL WORK PLACEMENT NOTE 08/17/2014  [name]  Account Number:  [id number] Admit date:  08/13/2014  Clinical Social Worker:  Derenda FennelKARA Batina Dougan, LCSW  Date/time:  08/16/2014 12:18 PM  Clinical Social Work is seeking post-discharge placement for this patient at the following level of care:   SKILLED NURSING   (*CSW will update this form in Epic as items are completed)   08/16/2014  [facility]family provided with [facility] Department of Clinical Social Work's list of facilities offering this level of care within the geographic area requested by the patient (or if unable, by the patient's family).  08/16/2014  Patient/family informed of their freedom to choose among providers that offer the needed level of care, that participate in Medicare, Medicaid or managed care program needed by the patient, have an available bed and are willing to accept the patient.  08/16/2014  Patient/family informed of [facility]' ownership interest in [facility], as well as of the fact that they are under no obligation to receive care at this facility.  PASARR submitted to EDS on 08/16/2014 PASARR number received on 08/16/2014  FL2 transmitted to all facilities in geographic area requested by pt/family on  08/16/2014 FL2 transmitted to all facilities within larger geographic area on   Patient informed that his/her managed care company has contracts with or will negotiate with  certain facilities, including the following:     Patient/family informed of bed offers received:  08/17/2014 [facility] recommends and patient chooses bed at    Patient to be transferred to  on   Patient to be transferred to facility by  Patient and family notified of transfer on  Name of family member notified:    The following physician request were entered in Epic:   Additional Comments:  Derenda FennelKara Empress Newmann,  LCSW [phone]

## 2014-08-17 NOTE — Progress Notes (Signed)
ANTICOAGULATION CONSULT NOTE - follow up  Pharmacy Consult for Heparin Indication: DVT  No Known Allergies  Patient Measurements: Height: 5\' 7"  (170.2 cm) (5'7') Weight: 164 lb 14.5 oz (74.8 kg) IBW/kg (Calculated) : 66.1 Heparin Dosing Weight: 74.8 kg  Vital Signs: Temp: 98.8 F (37.1 C) (10/23 0319) Temp Source: Oral (10/23 0319) BP: 137/56 mmHg (10/23 0319) Pulse Rate: 75 (10/23 0319)  Labs:  Recent Labs  08/14/14 1204  08/14/14 1531 08/14/14 1817 08/15/14 0540 08/16/14 0248 08/16/14 2042 08/16/14 2057 08/16/14 2058 08/17/14 0045 08/17/14 0805  HGB  --   < > 9.0*  --  7.7* 7.0* 7.1*  --   --   --  7.6*  HCT  --   < > 25.7*  --  21.8* 19.5* 19.8*  --   --   --  21.4*  PLT  --   < > 123*  --  111* 99* 117*  --   --   --  122*  APTT  --   --   --   --   --   --   --   --  38*  --  76*  LABPROT  --   --   --  15.5*  --   --   --   --   --  19.7*  --   INR  --   --   --  1.22  --   --   --   --   --  1.66*  --   HEPARINUNFRC  --   --   --   --   --   --   --  >2.20*  --   --  >2.20*  CREATININE  --   --   --   --  0.98 0.90  --   --   --   --  1.00  TROPONINI <0.30  --   --   --   --   --   --   --   --   --   --   < > = values in this interval not displayed.  Estimated Creatinine Clearance: 68.9 ml/min (by C-G formula based on Cr of 1).   Medical History: History reviewed. No pertinent past medical history.  Medications:  Scheduled:  . sodium chloride   Intravenous Once  . sodium chloride   Intravenous Once  . cholecalciferol  400 Units Oral Daily  . ciprofloxacin  500 mg Oral BID  . cyanocobalamin  1,000 mcg Intramuscular Daily  . feeding supplement (ENSURE COMPLETE)  237 mL Oral BID BM  . folic acid  1 mg Oral Daily  . pantoprazole  40 mg Oral Daily  . prenatal multivitamin  1 tablet Oral Q1200  . thiamine  100 mg Intravenous Daily    Assessment: 63 yo male with RLE DVT Patient had been on Lovenox, transitioned to Apixaban on 08/15/2014 for DVT MD  requests discontinuation of Apixaban and convert to heparin Last apixaban dose 10/22 at 11:41 [facility] note indicates significant bleeding from penis, if HGB < 7, 1 unit of PRBC to be given Baseline HL and aPTT has been ordered Due to bleeding noted, aPTT / unfractionated heparin level at lower range of therapeutic goal range  Goal of Therapy:  APTT level 66-102s Heparin level 0.3-0.7 units/ml Monitor platelets by anticoagulation protocol: Yes   Plan:  Continue Heparin infusion at 1000 units/hr Unfractionated heparin level daily while on Heparin CBC daily while  on Heparin  Margo AyeHall, Lisbeth Puller A 08/17/2014,10:11 AM

## 2014-08-18 LAB — CBC
HCT: 19.2 % — ABNORMAL LOW (ref 39.0–52.0)
HCT: 25.8 % — ABNORMAL LOW (ref 39.0–52.0)
Hemoglobin: 6.7 g/dL — CL (ref 13.0–17.0)
Hemoglobin: 8.9 g/dL — ABNORMAL LOW (ref 13.0–17.0)
MCH: 39 pg — AB (ref 26.0–34.0)
MCH: 35.5 pg — AB (ref 26.0–34.0)
MCHC: 34.9 g/dL (ref 30.0–36.0)
MCHC: 34.5 g/dL (ref 30.0–36.0)
MCV: 111.6 fL — ABNORMAL HIGH (ref 78.0–100.0)
MCV: 102.8 fL — ABNORMAL HIGH (ref 78.0–100.0)
PLATELETS: 123 10*3/uL — AB (ref 150–400)
PLATELETS: 113 10*3/uL — AB (ref 150–400)
RBC: 1.72 MIL/uL — AB (ref 4.22–5.81)
RBC: 2.51 MIL/uL — ABNORMAL LOW (ref 4.22–5.81)
RDW: 25.7 % — AB (ref 11.5–15.5)
RDW: 24.4 % — ABNORMAL HIGH (ref 11.5–15.5)
WBC: 5 10*3/uL (ref 4.0–10.5)
WBC: 4.7 10*3/uL (ref 4.0–10.5)

## 2014-08-18 LAB — VITAMIN D 1,25 DIHYDROXY
Vitamin D 1, 25 (OH)2 Total: 25 pg/mL (ref 18–72)
Vitamin D2 1, 25 (OH)2: <8 pg/mL
Vitamin D3 1, 25 (OH)2: 25 pg/mL

## 2014-08-18 LAB — BASIC METABOLIC PANEL
Anion gap: 8 (ref 5–15)
BUN: 6 mg/dL (ref 6–23)
CO2: 26 mEq/L (ref 19–32)
Calcium: 9.1 mg/dL (ref 8.4–10.5)
Chloride: 110 mEq/L (ref 96–112)
Creatinine, Ser: 1 mg/dL (ref 0.50–1.35)
GFR calc Af Amer: 89 mL/min — ABNORMAL LOW (ref >90)
GFR calc non Af Amer: 77 mL/min — ABNORMAL LOW (ref >90)
Glucose, Bld: 97 mg/dL (ref 70–99)
POTASSIUM: 4.2 meq/L (ref 3.7–5.3)
SODIUM: 144 meq/L (ref 137–147)

## 2014-08-18 LAB — APTT: aPTT: 36 seconds (ref 24–37)

## 2014-08-18 LAB — PREPARE RBC (CROSSMATCH)

## 2014-08-18 MED ORDER — SODIUM CHLORIDE 0.9 % IV SOLN
Freq: Once | INTRAVENOUS | Status: AC
  Administered 2014-08-18: 12:00:00 via INTRAVENOUS

## 2014-08-18 NOTE — Progress Notes (Signed)
Called MD in relation to increased hematuria. Told MD pt had a hgb of 8, was on heparin drip at 10 mL/hr, and had a DVT.  MD said to wait to see results of this mornings hgb.

## 2014-08-18 NOTE — Progress Notes (Signed)
[facility] PROGRESS NOTE  Edwin PewCalvin [name] ION:[id number] DOB: 06/13/1951 DOA: 08/13/2014 PCP: No primary provider on file.  Assessment/Plan: Acute Encephalopathy -Multifactorial, but at this point appears to be most likely 2/2 severe nutritional deficiencies and UTI. -Continue vitamin B1/B12 replacement (thiamine deficiencies may explain his MRI findings).  -EEG WNL. -Improved.  Severe Nutritional Deficiencies/Severe Protein-Caloric Malnutrition/Adult FTT -B1/B12 supplementation. - MVI, Vit D Supplementation. -Will replete potassium, Mg/P ok.  Megaloblastic Anemia due to B12 deficiency/ABLA -Now with hematuria (see below for details). -Receiving 2 units of PRBCs for a Hb of 6.7. -IM B12 supplementation. -Folic acid >20.  Hematuria -Hb decreased further to 6.7. -Will transfuse 2 units of PRBCs. -DC heparin drip. -If hematuria does not resolve with cessation of anticoagulation, will need to consider urology consultation.  Right leg DVT -VQ negative for PE. -Anticoagulation discontinued due to hematuria. -Will request IVC filter placement.  UTI -Dirty UA but cx negative. -Will elect to treat with cipro PO for 5 days. -DC zosyn/vanc  Pancytopenia -Suspect related to nutritional deficiencies. -WBC and platelets rising.. -Hb dropping due to hematuria.  Code Status: Full Code Family Communication: Family friend at bedside updated on plan of care. Disposition Plan: To SNF, once medically ready.   Consultants:  Neurology, Dr. Gerilyn Pilgrimoonquah   Antibiotics:  Cipro   Subjective: Eating lunch. No complaints.  Objective: Filed Vitals:   08/18/14 1110 08/18/14 1145 08/18/14 1435 08/18/14 1523  BP: 108/53 107/64 109/56 105/57  Pulse: 77 75 72 72  Temp: 98.4 F (36.9 C) 98.6 F (37 C) 98.6 F (37 C) 98.2 F (36.8 C)  TempSrc: Oral Oral Oral Oral  Resp: 20 16 18 18   Height:      Weight:      SpO2: 100% 100% 100% 100%    Intake/Output Summary (Last 24  hours) at 08/18/14 1533 Last data filed at 08/18/14 1130  Gross per 24 hour  Intake 1524.83 ml  Output      0 ml  Net 1524.83 ml   Filed Weights   08/15/14 0607 08/16/14 0642 08/18/14 0612  Weight: 72.1 kg (158 lb 15.2 oz) 74.8 kg (164 lb 14.5 oz) 73.4 kg (161 lb 13.1 oz)    Exam:   General:  awake  Cardiovascular: RRR  Respiratory: CTA B  Abdomen: S/ND/NT/+BS  Extremities: no C/C/E/+pulses   Neurologic:  Non-focal  Data Reviewed: Basic Metabolic Panel:  Recent Labs Lab 08/14/14 0559 08/15/14 0540 08/16/14 0248 08/17/14 0805 08/18/14 0500  NA 140 139 140 142 144  K 4.1 3.6* 3.3* 4.0 4.2  CL 103 104 105 109 110  CO2 26 26 25 26 26   GLUCOSE 80 91 102* 112* 97  BUN 29* 20 12 7 6   CREATININE 0.86 0.98 0.90 1.00 1.00  CALCIUM 8.4 8.5 8.2* 8.7 9.1  MG  --   --   --  2.0  --   PHOS  --   --   --  2.1*  --    Liver Function Tests:  Recent Labs Lab 08/13/14 2126 08/14/14 0559 08/15/14 0540 08/15/14 1353  AST 14 13 12   --   ALT 7 6 6   --   ALKPHOS 49 42 37*  --   BILITOT 2.2* 1.7* 1.2  --   PROT 7.1 6.0 5.7* 6.3  ALBUMIN 3.7 3.1* 3.0*  --    No results found for this basename: LIPASE, AMYLASE,  in the last 168 hours  Recent Labs Lab 08/14/14 0008  AMMONIA 21  CBC:  Recent Labs Lab 08/13/14 2126 08/14/14 0559  08/15/14 0540 08/16/14 0248 08/16/14 2042 08/17/14 0805 08/17/14 1650 08/18/14 0500  WBC 4.2 3.1*  < > 3.1* 2.7* 4.5 5.1 6.1 5.0  NEUTROABS 2.7 1.4*  --  1.8  --   --   --   --   --   HGB 9.0* 7.7*  < > 7.7* 7.0* 7.1* 7.6* 8.0* 6.7*  HCT 25.6* 22.5*  < > 21.8* 19.5* 19.8* 21.4* 23.2* 19.2*  MCV 114.8* 115.4*  < > 114.1* 114.0* 113.8* 108.1* 108.9* 111.6*  PLT 137* 111*  < > 111* 99* 117* 122* 123* 123*  < > = values in this interval not displayed. Cardiac Enzymes:  Recent Labs Lab 08/13/14 2126 08/14/14 0041 08/14/14 0559 08/14/14 1204  TROPONINI <0.30 <0.30 <0.30 <0.30   BNP (last 3 results)  Recent Labs   08/13/14 2224  PROBNP 82.8   CBG: No results found for this basename: GLUCAP,  in the last 168 hours  Recent Results (from the past 240 hour(s))  URINE CULTURE     Status: None   Collection Time    08/13/14 11:02 PM      Result Value Ref Range Status   Specimen Description URINE, CATHETERIZED   Final   Special Requests NONE   Final   Culture  Setup Time     Final   Value: 08/14/2014 13:59     Performed at [facility]   Colony Count     Final   Value: NO GROWTH     [facility]   Culture     Final   Value: NO GROWTH     Performed at [facility]   Report Status 08/15/2014 FINAL   Final  CULTURE, BLOOD (ROUTINE X 2)     Status: None   Collection Time    08/14/14 12:13 AM      Result Value Ref Range Status   Specimen Description BLOOD LEFT ANTECUBITAL   Final   Special Requests     Final   Value: BOTTLES DRAWN AEROBIC AND ANAEROBIC AEB=2CC ANA=6CC   Culture NO GROWTH 4 DAYS   Final   Report Status PENDING   Incomplete  CULTURE, BLOOD (ROUTINE X 2)     Status: None   Collection Time    08/14/14 12:13 AM      Result Value Ref Range Status   Specimen Description BLOOD LEFT HAND   Final   Special Requests     Final   Value: BOTTLES DRAWN AEROBIC AND ANAEROBIC AEB=6CC ANA=2CC   Culture NO GROWTH 4 DAYS   Final   Report Status PENDING   Incomplete  URINE CULTURE     Status: None   Collection Time    08/16/14  5:15 PM      Result Value Ref Range Status   Specimen Description URINE, CLEAN CATCH BLOODY   Final   Special Requests CIPRO   Final   Culture  Setup Time     Final   Value: 08/17/2014 00:39     Performed at [facility]   Colony Count     Final   Value: NO GROWTH     [facility]formed at [facility]   Culture     Final   Value: NO GROWTH     Performed at [facility]   Report Status 08/17/2014 FINAL   Final     Studies: No results found.  Scheduled Meds: . sodium chloride   Intravenous Once  . sodium  chloride   Intravenous Once  . cholecalciferol  400 Units Oral Daily  . ciprofloxacin  500 mg Oral BID  . cyanocobalamin  1,000 mcg Intramuscular Daily  . feeding supplement (ENSURE COMPLETE)  237 mL Oral BID BM  . folic acid  1 mg Oral Daily  . pantoprazole  40 mg Oral Daily  . prenatal multivitamin  1 tablet Oral Q1200  . thiamine  100 mg Intravenous Daily   Continuous Infusions: . sodium chloride 70 mL/hr at 08/17/14 2124    Principal Problem:   Acute encephalopathy Active Problems:   Adult failure to thrive   UTI (lower urinary tract infection)   Pancytopenia   Severe protein-calorie malnutrition   Vitamin B12 deficiency   Right leg DVT   Megaloblastic anemia due to B12 deficiency   Vitamin D deficiency   Malnutrition of moderate degree   Hematuria    Time spent: 45 minutes. Greater than 50% of this time was spent in direct contact with the patient coordinating care.    Chaya Jan  [facility] Pager [phone]  If 7PM-7AM, please contact night-coverage at [email or web address], password [facility] 08/18/2014, 3:33 PM  LOS: 5 days

## 2014-08-19 DIAGNOSIS — R319 Hematuria, unspecified: Secondary | ICD-10-CM

## 2014-08-19 DIAGNOSIS — E440 Moderate protein-calorie malnutrition: Secondary | ICD-10-CM

## 2014-08-19 LAB — CBC
HEMATOCRIT: 24.7 % — AB (ref 39.0–52.0)
Hemoglobin: 8.3 g/dL — ABNORMAL LOW (ref 13.0–17.0)
MCH: 34.7 pg — ABNORMAL HIGH (ref 26.0–34.0)
MCHC: 33.6 g/dL (ref 30.0–36.0)
MCV: 103.3 fL — AB (ref 78.0–100.0)
Platelets: 115 10*3/uL — ABNORMAL LOW (ref 150–400)
RBC: 2.39 MIL/uL — ABNORMAL LOW (ref 4.22–5.81)
RDW: 25.2 % — AB (ref 11.5–15.5)
WBC: 4.7 10*3/uL (ref 4.0–10.5)

## 2014-08-19 LAB — CULTURE, BLOOD (ROUTINE X 2)
Culture: NO GROWTH
Culture: NO GROWTH

## 2014-08-19 LAB — PROTHROMBIN GENE MUTATION

## 2014-08-19 LAB — FACTOR 5 LEIDEN

## 2014-08-19 NOTE — Progress Notes (Signed)
Patient ID: [name], male   DOB: 12/14/1950, 63 y.o.   MRN: [id number] Aware of request for IVC filter placement on pt. Case d/w Dr. Ardy[address]ps[address]- [place] . [facility] and contact nurse in am to coordinate timing/arrange additional orders.

## 2014-08-19 NOTE — Progress Notes (Signed)
[facility] PROGRESS NOTE  Nance PewCalvin Waddill BJY:[id number] DOB: 07/13/1951 DOA: 08/13/2014 PCP: No primary provider on file.  Assessment/Plan: Acute Encephalopathy -Multifactorial, but at this point appears to be most likely 2/2 severe nutritional deficiencies and UTI. -Continue vitamin B1/B12 replacement (thiamine deficiencies may explain his MRI findings).  -EEG WNL. -Improved.  Severe Nutritional Deficiencies/Severe Protein-Caloric Malnutrition/Adult FTT -B1/B12 supplementation. - MVI, Vit D Supplementation. -Will replete potassium, Mg/P ok.  Megaloblastic Anemia due to B12 deficiency/ABLA -Now with hematuria (see below for details). -Receiving 2 units of PRBCs for a Hb of 6.7. -IM B12 supplementation. -Folic acid >20.  Hematuria -Hb decreased further to 6.7 on 10/24 for which she received 2 units of PRBCs in addition to another unit received on 10/23. -Anticoagulation has been discontinued -If hematuria continues overnight and requires further blood transfusion, may need to consider urology consult for further evaluation.  Right leg DVT -VQ negative for PE. -Anticoagulation discontinued due to hematuria. -Will request IVC filter placement.  UTI -Dirty UA but cx negative. -Will elect to treat with cipro PO for 5 days. -DC zosyn/vanc  Pancytopenia -Suspect related to nutritional deficiencies. -WBC and platelets rising.. -Hb dropping due to hematuria.  Code Status: Full Code Family Communication: Patient only Disposition Plan: To SNF, once medically ready.   Consultants:  Neurology, Dr. Gerilyn Pilgrimoonquah   Antibiotics:  Cipro   Subjective: Eating lunch. No complaints.  Objective: Filed Vitals:   08/18/14 1523 08/18/14 1842 08/18/14 2156 08/19/14 0649  BP: 105/57 111/57 120/63 116/84  Pulse: 72 77 74 67  Temp: 98.2 F (36.8 C) 98.4 F (36.9 C) 98.6 F (37 C) 98.5 F (36.9 C)  TempSrc: Oral Oral Oral Oral  Resp: 18 16 18 18   Height:      Weight:     76.8 kg (169 lb 5 oz)  SpO2: 100% 98% 98% 98%    Intake/Output Summary (Last 24 hours) at 08/19/14 1521 Last data filed at 08/19/14 0650  Gross per 24 hour  Intake    350 ml  Output   1000 ml  Net   -650 ml   Filed Weights   08/16/14 0642 08/18/14 0612 08/19/14 0649  Weight: 74.8 kg (164 lb 14.5 oz) 73.4 kg (161 lb 13.1 oz) 76.8 kg (169 lb 5 oz)    Exam:   General:  awake  Cardiovascular: RRR  Respiratory: CTA B  Abdomen: S/ND/NT/+BS  Extremities: no C/C/E/+pulses   Neurologic:  Non-focal  Data Reviewed: Basic Metabolic Panel:  Recent Labs Lab 08/14/14 0559 08/15/14 0540 08/16/14 0248 08/17/14 0805 08/18/14 0500  NA 140 139 140 142 144  K 4.1 3.6* 3.3* 4.0 4.2  CL 103 104 105 109 110  CO2 26 26 25 26 26   GLUCOSE 80 91 102* 112* 97  BUN 29* 20 12 7 6   CREATININE 0.86 0.98 0.90 1.00 1.00  CALCIUM 8.4 8.5 8.2* 8.7 9.1  MG  --   --   --  2.0  --   PHOS  --   --   --  2.1*  --    Liver Function Tests:  Recent Labs Lab 08/13/14 2126 08/14/14 0559 08/15/14 0540 08/15/14 1353  AST 14 13 12   --   ALT 7 6 6   --   ALKPHOS 49 42 37*  --   BILITOT 2.2* 1.7* 1.2  --   PROT 7.1 6.0 5.7* 6.3  ALBUMIN 3.7 3.1* 3.0*  --    No results found for this basename: LIPASE, AMYLASE,  in the last 168 hours  Recent Labs Lab 08/14/14 0008  AMMONIA 21   CBC:  Recent Labs Lab 08/13/14 2126 08/14/14 0559  08/15/14 0540  08/17/14 0805 08/17/14 1650 08/18/14 0500 08/18/14 1908 08/19/14 0603  WBC 4.2 3.1*  < > 3.1*  < > 5.1 6.1 5.0 4.7 4.7  NEUTROABS 2.7 1.4*  --  1.8  --   --   --   --   --   --   HGB 9.0* 7.7*  < > 7.7*  < > 7.6* 8.0* 6.7* 8.9* 8.3*  HCT 25.6* 22.5*  < > 21.8*  < > 21.4* 23.2* 19.2* 25.8* 24.7*  MCV 114.8* 115.4*  < > 114.1*  < > 108.1* 108.9* 111.6* 102.8* 103.3*  PLT 137* 111*  < > 111*  < > 122* 123* 123* 113* 115*  < > = values in this interval not displayed. Cardiac Enzymes:  Recent Labs Lab 08/13/14 2126 08/14/14 0041  08/14/14 0559 08/14/14 1204  TROPONINI <0.30 <0.30 <0.30 <0.30   BNP (last 3 results)  Recent Labs  08/13/14 2224  PROBNP 82.8   CBG: No results found for this basename: GLUCAP,  in the last 168 hours  Recent Results (from the past 240 hour(s))  URINE CULTURE     Status: None   Collection Time    08/13/14 11:02 PM      Result Value Ref Range Status   Specimen Description URINE, CATHETERIZED   Final   Special Requests NONE   Final   Culture  Setup Time     Final   Value: 08/14/2014 13:59     Performed at [facility]   Colony Count     Final   Value: NO GROWTH     Performed at [facility]   Culture     Final   Value: NO GROWTH     Performed at [facility]   Report Status 08/15/2014 FINAL   Final  CULTURE, BLOOD (ROUTINE X 2)     Status: None   Collection Time    08/14/14 12:13 AM      Result Value Ref Range Status   Specimen Description BLOOD LEFT ANTECUBITAL   Final   Special Requests     Final   Value: BOTTLES DRAWN AEROBIC AND ANAEROBIC AEB=2CC ANA=6CC   Culture NO GROWTH 5 DAYS   Final   Report Status 08/19/2014 FINAL   Final  CULTURE, BLOOD (ROUTINE X 2)     Status: None   Collection Time    08/14/14 12:13 AM      Result Value Ref Range Status   Specimen Description BLOOD LEFT HAND   Final   Special Requests     Final   Value: BOTTLES DRAWN AEROBIC AND ANAEROBIC AEB=6CC ANA=2CC   Culture NO GROWTH 5 DAYS   Final   Report Status 08/19/2014 FINAL   Final  URINE CULTURE     Status: None   Collection Time    08/16/14  5:15 PM      Result Value Ref Range Status   Specimen Description URINE, CLEAN CATCH BLOODY   Final   Special Requests CIPRO   Final   Culture  Setup Time     Final   Value: 08/17/2014 00:39     Performed at [facility]   Colony Count     Final   Value: NO GROWTH     [facility] at [facility]   Culture  Final   Value: NO GROWTH     Performed at [facility]   Report Status 08/17/2014  FINAL   Final     Studies: No results found.  Scheduled Meds: . sodium chloride   Intravenous Once  . sodium chloride   Intravenous Once  . cholecalciferol  400 Units Oral Daily  . ciprofloxacin  500 mg Oral BID  . cyanocobalamin  1,000 mcg Intramuscular Daily  . feeding supplement (ENSURE COMPLETE)  237 mL Oral BID BM  . folic acid  1 mg Oral Daily  . pantoprazole  40 mg Oral Daily  . prenatal multivitamin  1 tablet Oral Q1200  . thiamine  100 mg Intravenous Daily   Continuous Infusions: . sodium chloride 70 mL/hr at 08/18/14 2150    Principal Problem:   Acute encephalopathy Active Problems:   Adult failure to thrive   UTI (lower urinary tract infection)   Pancytopenia   Severe protein-calorie malnutrition   Vitamin B12 deficiency   Right leg DVT   Megaloblastic anemia due to B12 deficiency   Vitamin D deficiency   Malnutrition of moderate degree   Hematuria    Time spent: 35 minutes. Greater than 50% of this time was spent in direct contact with the patient coordinating care.    Chaya Jan  [facility] Pager [phone]  If 7PM-7AM, please contact night-coverage at [email or web address], password [facility] 08/19/2014, 3:21 PM  LOS: 6 days

## 2014-08-20 ENCOUNTER — Ambulatory Visit (HOSPITAL_COMMUNITY)
Admission: EM | Admit: 2014-08-20 | Discharge: 2014-08-20 | Disposition: A | Discharge status: Home / Self Care | Payer: Medicare Other | Source: Home / Self Care | Attending: Internal Medicine | Admitting: Internal Medicine

## 2014-08-20 ENCOUNTER — Encounter (HOSPITAL_COMMUNITY): Payer: Self-pay

## 2014-08-20 ENCOUNTER — Inpatient Hospital Stay (HOSPITAL_COMMUNITY)
Admission: EM | Admit: 2014-08-20 | Discharge: 2014-08-20 | Disposition: A | Discharge status: Home / Self Care | Payer: Medicare Other | Source: Home / Self Care | Attending: Internal Medicine | Admitting: Internal Medicine

## 2014-08-20 LAB — PROTEIN S, TOTAL: Protein S Ag, Total: 87 % (ref 60–150)

## 2014-08-20 LAB — TYPE AND SCREEN
ABO/RH(D): AB NEG
Antibody Screen: NEGATIVE
Unit division: 0
Unit division: 0
Unit division: 0
Unit division: 0
Unit division: 0

## 2014-08-20 LAB — BASIC METABOLIC PANEL
ANION GAP: 9 (ref 5–15)
BUN: 9 mg/dL (ref 6–23)
CO2: 26 meq/L (ref 19–32)
CREATININE: 1 mg/dL (ref 0.50–1.35)
Calcium: 8.4 mg/dL (ref 8.4–10.5)
Chloride: 109 mEq/L (ref 96–112)
GFR calc non Af Amer: 77 mL/min — ABNORMAL LOW (ref >90)
GFR, EST AFRICAN AMERICAN: 89 mL/min — AB (ref >90)
Glucose, Bld: 97 mg/dL (ref 70–99)
Potassium: 4 mEq/L (ref 3.7–5.3)
Sodium: 144 mEq/L (ref 137–147)

## 2014-08-20 LAB — CBC
HCT: 25.1 % — ABNORMAL LOW (ref 39.0–52.0)
Hemoglobin: 8.4 g/dL — ABNORMAL LOW (ref 13.0–17.0)
MCH: 34.9 pg — ABNORMAL HIGH (ref 26.0–34.0)
MCHC: 33.5 g/dL (ref 30.0–36.0)
MCV: 104.1 fL — ABNORMAL HIGH (ref 78.0–100.0)
Platelets: 148 10*3/uL — ABNORMAL LOW (ref 150–400)
RBC: 2.41 MIL/uL — ABNORMAL LOW (ref 4.22–5.81)
RDW: 23.9 % — AB (ref 11.5–15.5)
WBC: 4.6 10*3/uL (ref 4.0–10.5)

## 2014-08-20 LAB — MISCELLANEOUS TEST: Miscellaneous Test: 70283

## 2014-08-20 LAB — PROTEIN C, TOTAL: Protein C, Total: 45 % — ABNORMAL LOW (ref 72–160)

## 2014-08-20 LAB — PROTEIN S ACTIVITY: PROTEIN S ACTIVITY: 127 % (ref 69–129)

## 2014-08-20 LAB — OCCULT BLOOD X 1 CARD TO LAB, STOOL: FECAL OCCULT BLD: NEGATIVE

## 2014-08-20 LAB — APTT: APTT: 27 s (ref 24–37)

## 2014-08-20 LAB — PROTIME-INR
INR: 1.2 (ref 0.00–1.49)
Prothrombin Time: 15.4 seconds — ABNORMAL HIGH (ref 11.6–15.2)

## 2014-08-20 LAB — PROTEIN C ACTIVITY: Protein C Activity: 74 % — ABNORMAL LOW (ref 75–133)

## 2014-08-20 MED ORDER — FENTANYL CITRATE 0.05 MG/ML IJ SOLN
INTRAMUSCULAR | Status: AC
  Filled 2014-08-20: qty 6

## 2014-08-20 MED ORDER — MIDAZOLAM HCL 2 MG/2ML IJ SOLN
INTRAMUSCULAR | Status: AC | PRN
  Administered 2014-08-20: 0.5 mg via INTRAVENOUS

## 2014-08-20 MED ORDER — MIDAZOLAM HCL 2 MG/2ML IJ SOLN
INTRAMUSCULAR | Status: AC
  Filled 2014-08-20: qty 6

## 2014-08-20 MED ORDER — LIDOCAINE HCL 1 % IJ SOLN
INTRAMUSCULAR | Status: AC
  Filled 2014-08-20: qty 20

## 2014-08-20 MED ORDER — FENTANYL CITRATE 0.05 MG/ML IJ SOLN
INTRAMUSCULAR | Status: AC | PRN
  Administered 2014-08-20: 25 ug via INTRAVENOUS

## 2014-08-20 MED ORDER — IOHEXOL 300 MG/ML  SOLN
80.0000 mL | Freq: Once | INTRAMUSCULAR | Status: AC | PRN
  Administered 2014-08-20: 80 mL via INTRAVENOUS

## 2014-08-20 MED ORDER — IOHEXOL 300 MG/ML  SOLN
INTRAMUSCULAR | Status: AC | PRN
  Administered 2014-08-20: 1 mL via INTRAVENOUS

## 2014-08-20 NOTE — Discharge Instructions (Signed)
Inferior Vena Cava Filter Insertion Insertion of an inferior vena cava (IVC) filter is a procedure in which a filter is placed into the large vein in your abdomen that carries blood from the lower part of your body to your heart (inferior vena cava). Placement of the filter here helps prevent blood clots in the legs or pelvis from traveling to your lungs. A large blood clot in the lungs can cause death.  The filter is a small, metal device about an inch long. It is shaped like the spokes of an umbrella and is inserted through a pathway created in your neck or groin. The risks of this procedure are usually small and easily managed. Inferior vena cava filters are only used when blood thinners (anticoagulants) cannot be used to prevent blood clots from forming. This may occur because of:  You have severe platelet problems or shortage.  You have had recent or current major bleeding that cannot be treated.  You have bleeding associated with anticoagulants.  You have recurrence of blood clots while on anticoagulants.  You have a need for surgery in the near future.  You have bleeding in your head. EXPECTATIONS OF A FILTER  An IVC filter will reduce the risk of a large blood clot making its way to your lungs (pulmonary embolus, or PE). It cannot eliminate the risk completely, or prevent small PEs from occurring.  It does not stop blood clots from growing, recurring, or developing into postphlebitic syndrome. This is a condition that can occur after there is inflammation in a vein (phlebitis). LET [facility] CARE PROVIDER KNOW ABOUT:  Any allergies you have. This includes an allergy to iodine or contrast dye.  All medicines you are taking, including vitamins, herbs, eye drops, creams and over-the-counter medicines.  Previous problems you or members of your family have had with the use of anesthetics.  Any blood disorders you have.  Previous surgeries you had had.  Medical conditions you  have.  Possibility of pregnancy, if this applies. RISKS AND COMPLICATIONS Generally, this is a safe procedure. However, as with any procedure, problems can occur. Possible problems include:  A small bruise around the needle insertion site. A larger pooling of blood called a hematoma may form. This is usually of no concern.  The filter can block the vena cava. This can cause some swelling of the legs.  The filter may eventually fail and not work properly.  Continued bleeding or infections (uncommon).  Damage to the vein by the catheter (rare). BEFORE THE PROCEDURE  Your health care provider may want you to have blood tests. These tests can help tell how well your kidneys and liver are working. They can also show how well your blood clots.  If you take blood thinners, ask your health care provider if and when you should stop taking them.  Do not eat or drink for 4 hours before the procedure or as directed by your health care provider.  Make arrangements for someone to drive you home. Depending on the procedure, you may be able to go home the same day.  PROCEDURE   Insertion of a Vena Cava filter is performed by a vascular surgeon or cardiologist in a cardiac catheterization lab.  The procedure usually takes about 30 minutes to 1 hour. This can vary.  An IV needle will be inserted into one of your veins. Medicine will be able to flow directly into your body through this needle.  Medicines may given to help you relax  and relieve anxiety (sedative).  The procedure is done through a large vein either in your neck or groin. The skin around this area is cleaned and shaved, as necessary.  The skin and deeper tissues over the vein will be made numb with a local anesthetic. You are awake during the procedure and can let your health care providers know if you have discomfort.  A needle is then put into the vein. A guide wire is placed through the needle and into the vein. This is used to  help insert a catheter and the IVC filter into your vein.  Contrast dye may be injected into the inferior vena cava to help guide the catheter and verify precise placement of the IVC filter. X-ray equipment may also be used to verify that the catheter and the wire are in the correct position.  The wire is then withdrawn.  The IVC filter is then passed over the catheter into the vein, and inserted into the correct location in the vena cava.  The catheter is then removed. Pressure will be kept on the needle insertion point for several minutes or until it is unlikely to bleed. AFTER THE PROCEDURE  You will stay in a recovery area until any sedation medicine you were given has worn off. Your blood pressure and pulse will be checked.  If there are no problems, you should be able to go home after the procedure.  You may feel sore at the area of the needle insertion for a few days. Document Released: 12/02/2005 Document Revised: 10/17/2013 Document Reviewed: 06/19/2013 [facility] Patient Information 2015 [place]. This information is not intended to replace advice given to you by your health care provider. Make sure you discuss any questions you have with your health care provider. Conscious Sedation, Adult, Care After Refer to this sheet in the next few weeks. These instructions provide you with information on caring for yourself after your procedure. Your health care provider may also give you more specific instructions. Your treatment has been planned according to current medical practices, but problems sometimes occur. Call your health care provider if you have any problems or questions after your procedure. WHAT TO EXPECT AFTER THE PROCEDURE  After your procedure:  You may feel sleepy, clumsy, and have poor balance for several hours.  Vomiting may occur if you eat too soon after the procedure. HOME CARE INSTRUCTIONS  Do not participate in any activities where you could become injured  for at least 24 hours. Do not:  Drive.  Swim.  Ride a bicycle.  Operate heavy machinery.  Cook.  Use power tools.  Climb ladders.  Work from a high place.  Do not make important decisions or sign legal documents until you are improved.  If you vomit, drink water, juice, or soup when you can drink without vomiting. Make sure you have little or no nausea before eating solid foods.  Only take over-the-counter or prescription medicines for pain, discomfort, or fever as directed by your health care provider.  Make sure you and your family fully understand everything about the medicines given to you, including what side effects may occur.  You should not drink alcohol, take sleeping pills, or take medicines that cause drowsiness for at least 24 hours.  If you smoke, do not smoke without supervision.  If you are feeling better, you may resume normal activities 24 hours after you were sedated.  Keep all appointments with your health care provider. SEEK MEDICAL CARE IF:  Your skin  is pale or bluish in color.  You continue to feel nauseous or vomit.  Your pain is getting worse and is not helped by medicine.  You have bleeding or swelling.  You are still sleepy or feeling clumsy after 24 hours. SEEK IMMEDIATE MEDICAL CARE IF:  You develop a rash.  You have difficulty breathing.  You develop any type of allergic problem.  You have a fever. MAKE SURE YOU:  Understand these instructions.  Will watch your condition.  Will get help right away if you are not doing well or get worse. Document Released: 08/02/2013 Document Reviewed: 08/02/2013 [facility] Patient Information 2015 [place], [place]. This information is not intended to replace advice given to you by your health care provider. Make sure you discuss any questions you have with your health care provider.

## 2014-08-20 NOTE — Progress Notes (Signed)
Referring Physician(s): Henderson CloudHernandez Acosta,Estela Y  Subjective:  63 yo male currently admitted at [facility]. Has been diagnosed with (R)LE DVT. He is at high risk for bleeding if given anticoagulation and already has hematuria, therefore cannot be given anticoagulation. IR is requested to place IVC filter. Chart, PMHx, meds, labs, imaging reviewed. Son at bedside  History reviewed. No pertinent past medical history. See active problem list. Patient Active Problem List   Diagnosis Date Noted  . Malnutrition of moderate degree 08/17/2014  . Hematuria 08/17/2014  . Megaloblastic anemia due to B12 deficiency 08/15/2014  . Vitamin D deficiency 08/15/2014  . Acute encephalopathy 08/14/2014  . Adult failure to thrive 08/14/2014  . UTI (lower urinary tract infection) 08/14/2014  . Pancytopenia 08/14/2014  . Severe protein-calorie malnutrition 08/14/2014  . Vitamin B12 deficiency 08/14/2014  . Right leg DVT 08/14/2014   History reviewed. No pertinent past surgical history.  History   Social History  . Marital Status: Single    Spouse Name: N/A    Number of Children: N/A  . Years of Education: N/A   Occupational History  . Not on file.   Social History Main Topics  . Smoking status: Never Smoker   . Smokeless tobacco: Current User  . Alcohol Use: No  . Drug Use: Not on file  . Sexual Activity: Not on file   Other Topics Concern  . Not on file   Social History Narrative  . No narrative on file    Allergies: Review of patient's allergies indicates no known allergies.  Medications:  No current facility-administered medications for this encounter. No current outpatient prescriptions on file. Facility-Administered Medications Ordered in Other Encounters: 0.9 %  sodium chloride infusion, , Intravenous, Continuous, Elliot Cousinenise Fisher, MD, Last Rate: 70 mL/hr at 08/20/14 1153;  0.9 %  sodium chloride infusion, , Intravenous, Once, Stephani PoliceMarianne L York, PA-C;  0.9 %  sodium  chloride infusion, , Intravenous, Once, Stephani PoliceMarianne L York, PA-C cholecalciferol (VITAMIN D) tablet 400 Units, 400 Units, Oral, Daily, Henderson CloudEstela Y Hernandez Acosta, MD, 400 Units at 08/20/14 [phone];  cyanocobalamin ((VITAMIN B-12)) injection 1,000 mcg, 1,000 mcg, Intramuscular, Daily, Henderson CloudEstela Y Hernandez Acosta, MD, 1,000 mcg at 08/20/14 0920;  feeding supplement (ENSURE COMPLETE) (ENSURE COMPLETE) liquid 237 mL, 237 mL, Oral, BID BM, Jenifer A Williams, RD, 237 mL at 08/20/14 0934 folic acid (FOLVITE) tablet 1 mg, 1 mg, Oral, Daily, Beryle BeamsKofi Doonquah, MD, 1 mg at 08/20/14 0917;  ondansetron (ZOFRAN) injection 4 mg, 4 mg, Intravenous, Q6H PRN, Henderson CloudEstela Y Hernandez Acosta, MD, 4 mg at 08/17/14 0347;  oxyCODONE-acetaminophen (PERCOCET/ROXICET) 5-325 MG per tablet 1 tablet, 1 tablet, Oral, Q4H PRN, Meredeth IdeGagan S Lama, MD, 1 tablet at 08/19/14 1342 pantoprazole (PROTONIX) EC tablet 40 mg, 40 mg, Oral, Daily, Elliot Cousinenise Fisher, MD, 40 mg at 08/20/14 [id number];  prenatal multivitamin tablet 1 tablet, 1 tablet, Oral, Q1200, Henderson CloudEstela Y Hernandez Acosta, MD, 1 tablet at 08/19/14 1337;  thiamine (B-1) injection 100 mg, 100 mg, Intravenous, Daily, Elliot Cousinenise Fisher, MD, 100 mg at 08/20/14 0920  Review of Systems  Vital Signs: BP 122/57  Pulse 72  Temp(Src) 98.1 F (36.7 C) (Oral)  Resp 20  SpO2 96%  Physical Exam General: NAD, pt seems alert but not fully understanding of procedure or current health issues. ENT: unremarkable airway Neck: supple, no JVD, no tracheal deviation Lungs: CTA without w/r/r Heart: Regular    Imaging: No results found.  Labs:  CBC:  Recent Labs  08/18/14 0500 08/18/14 1908 08/19/14 0603  08/20/14 0549  WBC 5.0 4.7 4.7 4.6  HGB 6.7* 8.9* 8.3* 8.4*  HCT 19.2* 25.8* 24.7* 25.1*  PLT 123* 113* 115* 148*    COAGS:  Recent Labs  08/14/14 1817 08/16/14 2058 08/17/14 0045 08/17/14 0805 08/18/14 0500 08/20/14 0549  INR 1.22  --  1.66*  --   --  1.20  APTT  --  38*  --  76* 36 27     BMP:  Recent Labs  08/16/14 0248 08/17/14 0805 08/18/14 0500 08/20/14 0549  NA 140 142 144 144  K 3.3* 4.0 4.2 4.0  CL 105 109 110 109  CO2 25 26 26 26   GLUCOSE 102* 112* 97 97  BUN 12 7 6 9   CALCIUM 8.2* 8.7 9.1 8.4  CREATININE 0.90 1.00 1.00 1.00  GFRNONAA 87* 77* 77* 77*  GFRAA >90 89* 89* 89*    LIVER FUNCTION TESTS:  Recent Labs  08/13/14 2126 08/14/14 0559 08/15/14 0540 08/15/14 1353  BILITOT 2.2* 1.7* 1.2  --   AST 14 13 12   --   ALT 7 6 6   --   ALKPHOS 49 42 37*  --   PROT 7.1 6.0 5.7* 6.3  ALBUMIN 3.7 3.1* 3.0*  --     Assessment and Plan: (R)LE DVT with relative contraindication to anticoagulation. For IVC filter placement Explained procedure to pt and son. Explained risks, complications, use of sedation. Labs reviewed, ok Consent obtained from son.     I spent a total of 20 minutes face to face in clinical consultation/evaluation, greater than 50% of which was counseling/coordinating care for placement of IVC filter.  SignedBrayton El: Randi Poullard 08/20/2014, 1:30 PM

## 2014-08-20 NOTE — Progress Notes (Signed)
IV at right a/c leaking D/c cath intact site WNL.

## 2014-08-20 NOTE — Progress Notes (Signed)
Agree.  Clinical indication for IVC filter based on DVT and inability to treat with anticoagulation.  Will proceed with IVC filter placement.  Patient seen and procedure discussed with patient and his son.

## 2014-08-20 NOTE — Progress Notes (Signed)
[facility] PROGRESS NOTE  Johnchristopher Sarvis NFA:[id number] DOB: 1950-11-17 DOA: 08/13/2014 PCP: No primary provider on file.  Assessment/Plan: Acute Encephalopathy -Multifactorial, but at this point appears to be most likely 2/2 severe nutritional deficiencies and UTI. -Continue vitamin B1/B12 replacement (thiamine deficiencies may explain his MRI findings).  -EEG WNL. -Improved.  Severe Nutritional Deficiencies/Severe Protein-Caloric Malnutrition/Adult FTT -B1/B12 supplementation. - MVI, Vit D Supplementation. -K repleted, Mg/P ok.  Megaloblastic Anemia due to B12 deficiency/ABLA -Now with hematuria (see below for details). -Receiving 2 units of PRBCs for a Hb of 6.7. -IM B12 supplementation. -Folic acid >20.  Hematuria -Hb decreased further to 6.7 on 10/24 for which he received 2 units of PRBCs in addition to another unit received on 10/23. -Anticoagulation has been discontinued -If hematuria continues overnight and requires further blood transfusion, may need to consider urology consult for further evaluation.  Right leg DVT -VQ negative for PE. -Anticoagulation discontinued due to hematuria. -Will request IVC filter placement.  UTI -Dirty UA but cx negative. -Will elect to treat with cipro PO for 5 days. -DC zosyn/vanc  Pancytopenia -Suspect related to nutritional deficiencies. -WBC and platelets rising.. -Hb dropping due to hematuria.  Code Status: Full Code Family Communication: Patient only Disposition Plan: To SNF, once medically ready.   Consultants:  Neurology, Dr. Gerilyn Pilgrim   Antibiotics:  Cipro   Subjective: Eating lunch. No complaints.  Objective: Filed Vitals:   08/18/14 2156 08/19/14 0649 08/19/14 2051 08/20/14 0537  BP: 120/63 116/84 143/75 126/63  Pulse: 74 67 77 71  Temp: 98.6 F (37 C) 98.5 F (36.9 C) 99.8 F (37.7 C) 98.9 F (37.2 C)  TempSrc: Oral Oral Oral Oral  Resp: 18 18 20 20   Height:      Weight:  76.8 kg (169  lb 5 oz)  76.5 kg (168 lb 10.4 oz)  SpO2: 98% 98% 99% 95%    Intake/Output Summary (Last 24 hours) at 08/20/14 1246 Last data filed at 08/20/14 0602  Gross per 24 hour  Intake 1826.33 ml  Output   1000 ml  Net 826.33 ml   Filed Weights   08/18/14 0612 08/19/14 0649 08/20/14 0537  Weight: 73.4 kg (161 lb 13.1 oz) 76.8 kg (169 lb 5 oz) 76.5 kg (168 lb 10.4 oz)    Exam:   General:  awake  Cardiovascular: RRR  Respiratory: CTA B  Abdomen: S/ND/NT/+BS  Extremities: no C/C/E/+pulses   Neurologic:  Non-focal  Data Reviewed: Basic Metabolic Panel:  Recent Labs Lab 08/15/14 0540 08/16/14 0248 08/17/14 0805 08/18/14 0500 08/20/14 0549  NA 139 140 142 144 144  K 3.6* 3.3* 4.0 4.2 4.0  CL 104 105 109 110 109  CO2 26 25 26 26 26   GLUCOSE 91 102* 112* 97 97  BUN 20 12 7 6 9   CREATININE 0.98 0.90 1.00 1.00 1.00  CALCIUM 8.5 8.2* 8.7 9.1 8.4  MG  --   --  2.0  --   --   PHOS  --   --  2.1*  --   --    Liver Function Tests:  Recent Labs Lab 08/13/14 2126 08/14/14 0559 08/15/14 0540 08/15/14 1353  AST 14 13 12   --   ALT 7 6 6   --   ALKPHOS 49 42 37*  --   BILITOT 2.2* 1.7* 1.2  --   PROT 7.1 6.0 5.7* 6.3  ALBUMIN 3.7 3.1* 3.0*  --    No results found for this basename: LIPASE, AMYLASE,  in  the last 168 hours  Recent Labs Lab 08/14/14 0008  AMMONIA 21   CBC:  Recent Labs Lab 08/13/14 2126 08/14/14 0559  08/15/14 0540  08/17/14 1650 08/18/14 0500 08/18/14 1908 08/19/14 0603 08/20/14 0549  WBC 4.2 3.1*  < > 3.1*  < > 6.1 5.0 4.7 4.7 4.6  NEUTROABS 2.7 1.4*  --  1.8  --   --   --   --   --   --   HGB 9.0* 7.7*  < > 7.7*  < > 8.0* 6.7* 8.9* 8.3* 8.4*  HCT 25.6* 22.5*  < > 21.8*  < > 23.2* 19.2* 25.8* 24.7* 25.1*  MCV 114.8* 115.4*  < > 114.1*  < > 108.9* 111.6* 102.8* 103.3* 104.1*  PLT 137* 111*  < > 111*  < > 123* 123* 113* 115* 148*  < > = values in this interval not displayed. Cardiac Enzymes:  Recent Labs Lab 08/13/14 2126 08/14/14 0041  08/14/14 0559 08/14/14 1204  TROPONINI <0.30 <0.30 <0.30 <0.30   BNP (last 3 results)  Recent Labs  08/13/14 2224  PROBNP 82.8   CBG: No results found for this basename: GLUCAP,  in the last 168 hours  Recent Results (from the past 240 hour(s))  URINE CULTURE     Status: None   Collection Time    08/13/14 11:02 PM      Result Value Ref Range Status   Specimen Description URINE, CATHETERIZED   Final   Special Requests NONE   Final   Culture  Setup Time     Final   Value: 08/14/2014 13:59     Performed at [facility]   Colony Count     Final   Value: NO GROWTH     Performed at [facility]   Culture     Final   Value: NO GROWTH     Performed at [facility]   Report Status 08/15/2014 FINAL   Final  CULTURE, BLOOD (ROUTINE X 2)     Status: None   Collection Time    08/14/14 12:13 AM      Result Value Ref Range Status   Specimen Description BLOOD LEFT ANTECUBITAL   Final   Special Requests     Final   Value: BOTTLES DRAWN AEROBIC AND ANAEROBIC AEB=2CC ANA=6CC   Culture NO GROWTH 5 DAYS   Final   Report Status 08/19/2014 FINAL   Final  CULTURE, BLOOD (ROUTINE X 2)     Status: None   Collection Time    08/14/14 12:13 AM      Result Value Ref Range Status   Specimen Description BLOOD LEFT HAND   Final   Special Requests     Final   Value: BOTTLES DRAWN AEROBIC AND ANAEROBIC AEB=6CC ANA=2CC   Culture NO GROWTH 5 DAYS   Final   Report Status 08/19/2014 FINAL   Final  URINE CULTURE     Status: None   Collection Time    08/16/14  5:15 PM      Result Value Ref Range Status   Specimen Description URINE, CLEAN CATCH BLOODY   Final   Special Requests CIPRO   Final   Culture  Setup Time     Final   Value: 08/17/2014 00:39     Performed at [facility]   Colony Count     Final   Value: NO GROWTH     [facility]formed at [facility]   Culture  Final   Value: NO GROWTH     Performed at [facility]   Report Status 08/17/2014  FINAL   Final     Studies: No results found.  Scheduled Meds: . sodium chloride   Intravenous Once  . sodium chloride   Intravenous Once  . cholecalciferol  400 Units Oral Daily  . cyanocobalamin  1,000 mcg Intramuscular Daily  . feeding supplement (ENSURE COMPLETE)  237 mL Oral BID BM  . folic acid  1 mg Oral Daily  . pantoprazole  40 mg Oral Daily  . prenatal multivitamin  1 tablet Oral Q1200  . thiamine  100 mg Intravenous Daily   Continuous Infusions: . sodium chloride 70 mL/hr at 08/20/14 1153    Principal Problem:   Acute encephalopathy Active Problems:   Adult failure to thrive   UTI (lower urinary tract infection)   Pancytopenia   Severe protein-calorie malnutrition   Vitamin B12 deficiency   Right leg DVT   Megaloblastic anemia due to B12 deficiency   Vitamin D deficiency   Malnutrition of moderate degree   Hematuria    Time spent: 35 minutes. Greater than 50% of this time was spent in direct contact with the patient coordinating care.    Chaya JanHERNANDEZ ACOSTA,ESTELA  [facility] Pager [phone]  If 7PM-7AM, please contact night-coverage at [email or web address], password [email or web address] 08/20/2014, 12:46 PM  LOS: 7 days

## 2014-08-20 NOTE — Progress Notes (Signed)
Occupational Therapy Treatment Patient Details Name: Edwin [name] MRN: [id number] DOB: 06/06/1951 Today's Date: 08/20/2014    History of present illness This is a 63 y.o. year old male with no prior medical care  presenting with encephalopathy and failure to thrive. Family states that pt lives alone. He has never seen a doctor per pt and family. Family states that pt has had progressive weakness, decreased appetite, confusion over past 2 months. No known prior history of medical problems. Pt denies any ETOH or tobacco abuse. Does report progressive weight loss and decreased appetite over extended period of time. Per report, pt was found covered in his own fecal material and urine  at home today by EMS   OT comments  Pt presenting with increased difficulty with balance this session. Required mod assist to maintain balance while standing at the sink this session.  Pt was unaware he had BM prior to getting out of bed - pt required max assist for hygiene due to poor balance.  Was unable to complete grooming tasks at sink this session due to increasingly poor balance during toilet hygiene.  Pt able to wash face with modified independence in sitting. Pt overall mod assist in functional mobility this session.   Follow Up Recommendations  SNF    Equipment Recommendations  None recommended by OT    Recommendations for Other Services      Precautions / Restrictions Precautions Precautions: Fall Precaution Comments: poor balance Restrictions Weight Bearing Restrictions: No       Mobility Bed Mobility Overal bed mobility: Modified Independent                Transfers Overall transfer level: Needs assistance Equipment used: Rolling walker (2 wheeled) Transfers: Sit to/from Stand Sit to Stand: Mod assist              Balance         Postural control: Posterior lean Standing balance support: Bilateral upper extremity supported;During functional activity Standing  balance-Leahy Scale: Poor (mod assist to maintain baalcne in standing. significant posterior lean, despite consistent verbal and tactile cueing)                     ADL Overall ADL's : Needs assistance/impaired     Grooming: Standing;Cueing for safety;Moderate assistance Grooming Details (indicate cue type and reason): Poor balance while standing at sink. Increased cueing needed from previous session.  Pt required continuous support to maintain balance this session.  In sitting, pt was modified independent with washing face.             Lower Body Dressing: Minimal assistance Lower Body Dressing Details (indicate cue type and reason): pt required assist this session for adjusting socks. attempted to adjust socks on his feet and was unable to manipulate them. OTR assisted in turign socks and pulling them up.     Toileting- Clothing Manipulation and Hygiene: Maximal assistance;Sit to/from stand Toileting - Clothing Manipulation Details (indicate cue type and reason): Pt had BM in bed - standing up for hygiene during session.  Pt required max assist for cleaning self.  Was unable to maintain upright posture without mod assist and continuous verbal cueing.              Vision                     Perception     Praxis      Cognition   Behavior During Therapy: Flat affect  Overall Cognitive Status: Within Functional Limits for tasks assessed (pt remembered session on Friday)                       Extremity/Trunk Assessment               Exercises     Shoulder Instructions       General Comments      Pertinent Vitals/ Pain       Pain Assessment: No/denies pain  Home Living                                          Prior Functioning/Environment              Frequency Min 2X/week     Progress Toward Goals  OT Goals(current goals can now be found in the care plan section)  Progress towards OT goals: Progressing  toward goals     Plan Discharge plan remains appropriate    Co-evaluation                 End of Session Equipment Utilized During Treatment: Gait belt;Rolling walker   Activity Tolerance Patient tolerated treatment well   Patient Left in chair;with call bell/phone within reach;with chair alarm set;with nursing/sitter in room   Nurse Communication          Time: [phone] OT Time Calculation (min): 30 min  Charges: OT General Charges $OT Visit: 1 Procedure OT Treatments $Self Care/Home Management : 23-37 mins   Edwin GuanMarie Rawlings Treylan Mcclintock, MS, OTR/L [facility] [phone] 08/20/2014, 9:53 AM

## 2014-08-20 NOTE — Progress Notes (Signed)
Called [facility] and notified them that pt was ready for immediate transfer back to [facility] as per Radiology, Dr.Yamagata.  They state they will come as soon as a truck is available.

## 2014-08-20 NOTE — Progress Notes (Signed)
Received report from Channing MuttersMark Brisson, RN from [facility].  Pt tolerated procedure well.  VS stable.  Dressing on left neck clean and dry.  Will continue to monitor.  Son at bedside.

## 2014-08-20 NOTE — Progress Notes (Signed)
[facility] is here to transport [facility].  Report was given.

## 2014-08-20 NOTE — Clinical Social Work Note (Signed)
Pt's son requested to see CSW regarding POA. CSW explained differences in POA and HCPOA. He is primarily concerned about handling pt's affairs outside of the hospital. He is aware that hospital is only able to assist with HCPOA. Son may consider contacting a lawyer for POA paperwork. CSW updated [name] on pt and they continue to be willing to accept at d/c.   Derenda FennelKara Greyson Peavy, LCSW [phone]

## 2014-08-20 NOTE — Procedures (Signed)
Procedure:  IVC venogram and filter placement Findings:  No IVC thrombus.  Bard LewistonDenali IVC filter placed in infrarenal IVC.

## 2014-08-20 NOTE — Progress Notes (Signed)
Channing MuttersMark Brisson, RN called report to Lowella Bandyikki at [facility], the pt's nurse, and gave her a report on the pt.

## 2014-08-20 NOTE — Progress Notes (Signed)
IR has coordinated for pt to [facility] at ~1300 for IVC filter procedure. I have spoke to son, Jaye BeagleCalvin Brown, on telephone and explained procedure, risks, complications. Informed consent has been obtained. Pt will be seen pre-procedure upon [facility] Stay de[facility].  Brayton ElKevin Deannah Rossi PA-C Interventional Radiology 08/20/2014 8:55 AM

## 2014-08-21 ENCOUNTER — Encounter (HOSPITAL_COMMUNITY)
Admission: EM | Disposition: A | Discharge status: Skilled Nursing Facility | Payer: Self-pay | Source: Home / Self Care | Attending: Internal Medicine

## 2014-08-21 DIAGNOSIS — R310 Gross hematuria: Secondary | ICD-10-CM

## 2014-08-21 DIAGNOSIS — Q646 Congenital diverticulum of bladder: Secondary | ICD-10-CM

## 2014-08-21 DIAGNOSIS — N210 Calculus in bladder: Secondary | ICD-10-CM

## 2014-08-21 DIAGNOSIS — N390 Urinary tract infection, site not specified: Secondary | ICD-10-CM

## 2014-08-21 HISTORY — PX: CYSTOSCOPY: SHX5120

## 2014-08-21 LAB — CBC
HCT: 26.1 % — ABNORMAL LOW (ref 39.0–52.0)
Hemoglobin: 8.5 g/dL — ABNORMAL LOW (ref 13.0–17.0)
MCH: 34.1 pg — AB (ref 26.0–34.0)
MCHC: 32.6 g/dL (ref 30.0–36.0)
MCV: 104.8 fL — AB (ref 78.0–100.0)
PLATELETS: 183 10*3/uL (ref 150–400)
RBC: 2.49 MIL/uL — AB (ref 4.22–5.81)
RDW: 22.4 % — ABNORMAL HIGH (ref 11.5–15.5)
WBC: 5.2 10*3/uL (ref 4.0–10.5)

## 2014-08-21 SURGERY — CYSTOSCOPY, FLEXIBLE
Anesthesia: LOCAL

## 2014-08-21 MED ORDER — LIDOCAINE HCL 2 % EX GEL
CUTANEOUS | Status: DC | PRN
  Administered 2014-08-21: 1 via TOPICAL

## 2014-08-21 MED ORDER — LIDOCAINE HCL 2 % EX GEL
CUTANEOUS | Status: AC
  Filled 2014-08-21: qty 10

## 2014-08-21 MED ORDER — STERILE WATER FOR IRRIGATION IR SOLN
Status: DC | PRN
  Administered 2014-08-21: 3000 mL via INTRAVESICAL

## 2014-08-21 MED ORDER — VITAMIN B-1 100 MG PO TABS
100.0000 mg | ORAL_TABLET | Freq: Every day | ORAL | Status: DC
  Administered 2014-08-22: 100 mg via ORAL
  Filled 2014-08-21: qty 1

## 2014-08-21 SURGICAL SUPPLY — 9 items
GLOVE BIOGEL PI IND STRL 6.5 (GLOVE) ×1 IMPLANT
GLOVE BIOGEL PI IND STRL 7.0 (GLOVE) ×1 IMPLANT
GLOVE BIOGEL PI IND STRL 7.5 (GLOVE) ×1 IMPLANT
GLOVE BIOGEL PI INDICATOR 6.5 (GLOVE) ×1
GLOVE BIOGEL PI INDICATOR 7.0 (GLOVE) ×1
GLOVE BIOGEL PI INDICATOR 7.5 (GLOVE) ×1
SET IRRIG Y TYPE TUR BLADDER L (SET/KITS/TRAYS/PACK) ×2 IMPLANT
WATER STERILE IRR 3000ML UROMA (IV SOLUTION) ×2 IMPLANT
YANKAUER SUCT 12FT TUBE ARGYLE (SUCTIONS) ×2 IMPLANT

## 2014-08-21 NOTE — Op Note (Signed)
Preoperative+ diagnosis: Gross hematuria with possible bladder stone  Postoperative diagnosis: Possible bladder stone, no severe urothelial abnormality   Procedure: Flexible cystoscopy    Surgeon: Bertram MillardStephen M. Ileen Kahre, M.D.   Anesthesia: Gen.   Complications: None  Specimen(s): None  Drain(s): None  Indications: 63 year old male with gross hematuria, thickened bladder wall, possible bladder stone. Cystoscopy performed to evaluate for urothelial abnormality    Technique and findings: The patient was properly identified. Proper timeout was performed.  The penis was prepped and draped, 2% viscous lidocaine was used for local anesthesia. A flexible cystoscope was advanced through the urethra. Urethra was unremarkable, prostate was not obstructed. Bladder was entered and inspected circumferentially. There were several large diverticula posteriorly. It was somewhat difficult to see as the urine/bladder was cloudy/slightly bloody. There was a fibrinous/whitish structure located posteriorly towards the midline. This may well have been the bladder calculus seen on the CT scan. I saw no large bladder tumors. Anteriorly, and at the dome of the bladder, no significant abnormalities were seen. I could not identify either ureteral orifice.  Patient tolerated procedure well. The scope was removed. A condom catheter was placed.

## 2014-08-21 NOTE — Progress Notes (Signed)
Occupational Therapy Treatment Patient Details Name: Nance PewCalvin Altidor MRN: [id number] DOB: 07/24/1951 Today's Date: 08/21/2014    History of present illness This is a 63 y.o. year old male with no prior medical care  presenting with encephalopathy and failure to thrive. Family states that pt lives alone. He has never seen a doctor per pt and family. Family states that pt has had progressive weakness, decreased appetite, confusion over past 2 months. No known prior history of medical problems. Pt denies any ETOH or tobacco abuse. Does report progressive weight loss and decreased appetite over extended period of time. Per report, pt was found covered in his own fecal material and urine  at home today by EMS   OT comments  Pt presented with some improved balance this session over previous session - only required min assist to maintain during duration of toilet hygiene. Pt was unaware that he had BM in bed, and was unable to hold need to urinate during standing activities.  Required mod assist for stand-pivot transfer to chair this session, and verbal cueing for foot placement.  Pt continued to benefit from skilled OT services.  D/C to SNF remains appropriate.     Follow Up Recommendations  SNF    Equipment Recommendations  None recommended by OT    Recommendations for Other Services      Precautions / Restrictions Precautions Precautions: Fall Precaution Comments: poor balance Restrictions Weight Bearing Restrictions: No       Mobility Bed Mobility Overal bed mobility: Modified Independent                Transfers Overall transfer level: Needs assistance Equipment used: Rolling walker (2 wheeled) Transfers: Sit to/from [employer];Stand Pivot Transfers Sit to Stand: Min assist Stand pivot transfers: Mod assist       General transfer comment: verbal cues for technique and hand placement. Poor balance and slow to process information.    Balance Overall balance assessment: Needs  assistance Sitting-balance support: No upper extremity supported;Feet supported Sitting balance-Leahy Scale: Good     Standing balance support: Bilateral upper extremity supported;During functional activity Standing balance-Leahy Scale: Poor                     ADL Overall ADL's : Needs assistance/impaired                     Lower Body Dressing: Minimal assistance Lower Body Dressing Details (indicate cue type and reason): Min assist and extra time to don socks.  Assist required for donnig sock on RLE due to increased edema Toilet Transfer: Stand-pivot;Moderate assistance;RW   Toileting- Clothing Manipulation and Hygiene: Maximal assistance;Sit to/from stand Toileting - Clothing Manipulation Details (indicate cue type and reason): When asked prior to session, pt denied having BM in bed.  when transitioning sit>stand pt had soiiled bed - pt indicated he was unaware.  Upon standnig, pt indicated he needed to urinate - condom cath had fallen off in transfer and pt urinated on floor.  pt required max assist for hygiene, but was able to remain standing during cleanup with min assist and RW for balance.              Vision                     Perception     Praxis      Cognition   Behavior During Therapy: Flat affect Overall Cognitive Status: Within Functional Limits for tasks assessed  Extremity/Trunk Assessment               Exercises Other Exercises Other Exercises: Sidestepping and retro gait 2RT beside handrails on wall    Shoulder Instructions       General Comments      Pertinent Vitals/ Pain       Pain Assessment: No/denies pain  Home Living                                          Prior Functioning/Environment              Frequency Min 2X/week     Progress Toward Goals  OT Goals(current goals can now be found in the care plan section)  Progress towards OT goals:  Progressing toward goals     Plan Discharge plan remains appropriate    Co-evaluation                 End of Session Equipment Utilized During Treatment: Gait belt;Rolling walker   Activity Tolerance Patient tolerated treatment well   Patient Left in chair;with nursing/sitter in room;with chair alarm set   Nurse Communication          Time: 1001-1040 OT Time Calculation (min): 39 min  Charges: OT General Charges $OT Visit: 1 Procedure OT Treatments $Self Care/Home Management : 38-52 mins   Marry GuanMarie Rawlings Brand Siever, MS, OTR/L [facility] [phone] 08/21/2014, 12:17 PM

## 2014-08-21 NOTE — Progress Notes (Signed)
NUTRITION FOLLOW UP  Pt meets criteria for moderate MALNUTRITION in the context of chronic illness as evidenced by mild fat and muscle depletion, <75% of estimated energy intake x 1 month.  Intervention:   -Continue with Ensure Complete po BID, each supplement provides 350 kcal and 13 grams of protein  Nutrition Dx:   Inadequate oral intake related to altered taste perception as evidenced by diet recall, mild fat and muscle depletion; progressing  Goal:   Pt will meet >90% of estimated nutritional needs  Monitor:   PO/supplement intake, labs, weight changes, I/O's  Assessment:   This is a 63 y.o. year old male with no prior medical care presenting with encephalopathy and failure to thrive. Family states that pt lives alone. He has never seen a doctor per pt and family. Family states that pt has had progressive weakness, decreased appetite, confusion over past 2 months. No known prior history of medical problems. Pt denies any ETOH or tobacco abuse. Does report progressive weight loss and decreased appetite over extended period of time. Per report, pt was found covered in his own fecal material and urine at home today by EMS  [facility]. Intake remains good; PO: 50-100%. Pt also continues to drink Ensure BID. C B1, B12, Vitamin D, folic acid, and MVI supplements.  Noted wt gain trend since admission, likely due to improved intake.  [facility] once [facility].  Labs reviewed. Glucose, K and Calcium now WDL.  Phos: 2.1.   Height: Ht Readings from Last 1 Encounters:  08/14/14 5\' 7"  (1.702 m)    Weight Status:   Wt Readings from Last 1 Encounters:  08/21/14 170 lb 6.7 oz (77.3 kg)   08/16/14  164 lb 14.5 oz (74.8 kg)   08/14/14 155 lb 10.3 oz (70.6 kg)    Re-estimated needs:  Kcal: 1900-2100 Protein: 85-95 grams Fluid: 1.9-2.1 L  Skin: closed rt neck incision  Diet Order: Dysphagia  2   Intake/Output Summary (Last 24 hours) at 08/21/14 0928 Last data filed at 08/21/14 0600  Gross per 24 hour  Intake 2147.67 ml  Output    600 ml  Net 1547.67 ml    Last BM: 08/20/14   Labs:   Recent Labs Lab 08/16/14 0248 08/17/14 0805 08/18/14 0500 08/20/14 0549  NA 140 142 144 144  K 3.3* 4.0 4.2 4.0  CL 105 109 110 109  CO2 25 26 26 26   BUN 12 7 6 9   CREATININE 0.90 1.00 1.00 1.00  CALCIUM 8.2* 8.7 9.1 8.4  MG  --  2.0  --   --   PHOS  --  2.1*  --   --   GLUCOSE 102* 112* 97 97    CBG (last 3)  No results found for this basename: GLUCAP,  in the last 72 hours  Scheduled Meds: . sodium chloride   Intravenous Once  . sodium chloride   Intravenous Once  . cholecalciferol  400 Units Oral Daily  . cyanocobalamin  1,000 mcg Intramuscular Daily  . feeding supplement (ENSURE COMPLETE)  237 mL Oral BID BM  . folic acid  1 mg Oral Daily  . pantoprazole  40 mg Oral Daily  . prenatal multivitamin  1 tablet Oral Q1200  . thiamine  100 mg Intravenous Daily    Continuous Infusions: . sodium chloride 70 mL/hr at 08/20/14 2151    Almina Schul A. Mayford KnifeWilliams, RD, LDN Pager:  [phone]   

## 2014-08-21 NOTE — Consult Note (Signed)
Urology Consult  Consulting WG:NFAOZHD:Acosta   CC:  gross hematuria  HPI: This is a 63 year old male admitted to the medicine service a few days ago with acute encephalopathy, probably felt secondary to malnutrition as well as urinary tract infection. The patient has been improving in his overall medical condition since his admission. He has developed gross hematuria. His urinalysis on admission revealed many bacteria, triple phosphate crystals, microscopic hematuria. Culture was negative, however. CT scan revealed normal kidneys, but a stone in his bladder with a thick-walled bladder, as well as diverticula. Urothelial malignancy could not be ruled out.  The patient has not seen a doctor in many years. He lives by himself. His family has been worried about him, and has been checking on him. Because of his declining health, he has been admitted.  The patient states that he has had urinary frequency, urgency and gross hematuria for many months. He has no dysuria. There is no history of kidney stones. There is a family history of prostate cancer in his brother.  PMH: History reviewed. No pertinent past medical history.  PSH: History reviewed. No pertinent past surgical history.  Allergies: No Known Allergies  Medications: No prescriptions prior to admission     Social History: History   Social History  . Marital Status: Single    Spouse Name: N/A    Number of Children: N/A  . Years of Education: N/A   Occupational History  . Not on file.   Social History Main Topics  . Smoking status: Never Smoker   . Smokeless tobacco: Current User  . Alcohol Use: No  . Drug Use: Not on file  . Sexual Activity: Not on file   Other Topics Concern  . Not on file   Social History Narrative  . No narrative on file    Family History: No family history on file.  Review of Systems: Positive: Gross hematuria, frequency, urgency, urinary incontinence Negative:   A further 10 point review of  systems was negative except what is listed in the HPI.  Physical Exam: @VITALS2 @ General: No acute distress.  Awake. Head:  Normocephalic.  Atraumatic. ENT:  EOMI.  Mucous membranes moist Neck:  Supple.  No lymphadenopathy. CV:  S1 present. S2 present. Regular rate. Pulmonary: Equal effort bilaterally.  Clear to auscultation bilaterally. Abdomen: Soft.  Non-tender to palpation. Skin:  Normal turgor.  No visible rash. Extremity: No gross deformity of bilateral upper extremities.  No gross deformity of    bilateral lower extremities. Neurologic: Alert. Appropriate mood.  Penis:  Uncircumcised.  No lesions. Condom catheter in place Urethra:   Orthotopic meatus. Scrotum: No lesions.  No ecchymosis.  No erythema. Testicles: Descended bilaterally.  No masses bilaterally. Epididymis: Palpable bilaterally.  Non Tender to palpation.  Studies:  Recent Labs     08/20/14  0549  08/21/14  0552  HGB  8.4*  8.5*  WBC  4.6  5.2  PLT  148*  183    Recent Labs     08/20/14  0549  NA  144  K  4.0  CL  109  CO2  26  BUN  9  CREATININE  1.00  CALCIUM  8.4  GFRNONAA  77*  GFRAA  89*     Recent Labs     08/20/14  0549  INR  1.20  APTT  27     No components found with this basename: ABG,   CT images were reviewed. The patient has normal upper  tracts. There is a bladder that is thick-walled, with injection of the perivesical fat. I don't see any mass lesions, but there is a 31.5 x 29 x 30 mm stone in a posterior diverticulum. There is no hydronephrosis.  Cystoscopy was performed, please see dictated procedure note  Assessment:  1. Gross hematuria. This is probably secondary to the patient's bladder stone. I didn't see any overt bladder tumors today on cystoscopy. I do not think that this mild amount of hematuria, over the short-term anyway, can account for the patient's decreased hemoglobin/anemia. More likely, this is long-term, perhaps related to multiple medical factors.  2.  Pyuria, with negative cultures. I suspect that this may well be due to an infected stone, however. I would treat the patient with 7 days of an antibiotic, with long-term suppression for now once he leaves the hospital  3. Large bladder calculus, may well be secondary to urea splitting organism i.e. Proteus. This needs management long-term with eventual cystolitholapaxy  4. Bladder diverticula-most likely due to long-term bladder outlet obstruction. His prostate does not really seem obstructive. This can be better evaluated by outpatient anesthetic cystoscopy.  Recommendations:  1. Antibiotics as discussed above  2. The patient will need outpatient cystoscopy, possible cystolitholapaxy, bladder biopsy and retrograde ureteropyelograms. This is not an urgent matter, can be scheduled after an outpatient follow-up in our office.  3. We will set up an appropriate follow-up in the office to discuss the above.      Pager:[phone]

## 2014-08-21 NOTE — Progress Notes (Signed)
[facility] PROGRESS NOTE  Edwin [name] ZOX:[id number] DOB: [birth year] DOA: 08/13/2014 PCP: No primary provider on file.  Assessment/Plan: Acute Encephalopathy -Multifactorial, but at this point appears to be most likely 2/2 severe nutritional deficiencies and UTI. -Continue vitamin B1/B12 replacement (thiamine deficiencies may explain his MRI findings).  -EEG WNL. -Improved.  Severe Nutritional Deficiencies/Severe Protein-Caloric Malnutrition/Adult FTT -B1/B12 supplementation. - MVI, Vit D Supplementation. -K repleted, Mg/P ok.  Megaloblastic Anemia due to B12 deficiency/ABLA -Now with hematuria (see below for details). -Received 2 units of PRBCs for a Hb of 6.7. -IM B12 supplementation. -Folic acid >20.  Hematuria -Hb decreased further to 6.7 on 10/24 for which he received 2 units of PRBCs in addition to another unit received on 10/23. -Anticoagulation has been discontinued -Urology consult has been requested and it appears a cystoscopy has been ordered.  Right leg DVT -VQ negative for PE. -Anticoagulation discontinued due to hematuria. -IVC filter placed 10/26.  UTI -Dirty UA but cx negative. -Will elect to treat with cipro PO for 5 days. -DC zosyn/vanc  Pancytopenia -Suspect related to nutritional deficiencies. -WBC and platelets rising.. -Hb dropping due to hematuria.  Code Status: Full Code Family Communication: Patient only Disposition Plan: To SNF, once medically ready.   Consultants:  Neurology, Dr. Gerilyn Pilgrim  Urology   Antibiotics:  Cipro   Subjective: No complaints.  Objective: Filed Vitals:   08/20/14 2010 08/20/14 2050 08/21/14 0627 08/21/14 0700  BP: 117/65 112/53  140/69  Pulse: 72 69  71  Temp: 98.6 F (37 C) 98.6 F (37 C)  98.6 F (37 C)  TempSrc: Oral Oral  Oral  Resp: 18 18  18   Height:      Weight:   77.3 kg (170 lb 6.7 oz)   SpO2: 99% 100%  100%    Intake/Output Summary (Last 24 hours) at 08/21/14 1407 Last  data filed at 08/21/14 0600  Gross per 24 hour  Intake 2147.67 ml  Output    600 ml  Net 1547.67 ml   Filed Weights   08/19/14 0649 08/20/14 0537 08/21/14 0627  Weight: 76.8 kg (169 lb 5 oz) 76.5 kg (168 lb 10.4 oz) 77.3 kg (170 lb 6.7 oz)    Exam:   General:  awake  Cardiovascular: RRR  Respiratory: CTA B  Abdomen: S/ND/NT/+BS  Extremities: no C/C/E/+pulses   Neurologic:  Non-focal  Data Reviewed: Basic Metabolic Panel:  Recent Labs Lab 08/15/14 0540 08/16/14 0248 08/17/14 0805 08/18/14 0500 08/20/14 0549  NA 139 140 142 144 144  K 3.6* 3.3* 4.0 4.2 4.0  CL 104 105 109 110 109  CO2 26 25 26 26 26   GLUCOSE 91 102* 112* 97 97  BUN 20 12 7 6 9   CREATININE 0.98 0.90 1.00 1.00 1.00  CALCIUM 8.5 8.2* 8.7 9.1 8.4  MG  --   --  2.0  --   --   PHOS  --   --  2.1*  --   --    Liver Function Tests:  Recent Labs Lab 08/15/14 0540 08/15/14 1353  AST 12  --   ALT 6  --   ALKPHOS 37*  --   BILITOT 1.2  --   PROT 5.7* 6.3  ALBUMIN 3.0*  --    No results found for this basename: LIPASE, AMYLASE,  in the last 168 hours No results found for this basename: AMMONIA,  in the last 168 hours CBC:  Recent Labs Lab 08/15/14 0540  08/18/14 0500 08/18/14 1908  08/19/14 0603 08/20/14 0549 08/21/14 0552  WBC 3.1*  < > 5.0 4.7 4.7 4.6 5.2  NEUTROABS 1.8  --   --   --   --   --   --   HGB 7.7*  < > 6.7* 8.9* 8.3* 8.4* 8.5*  HCT 21.8*  < > 19.2* 25.8* 24.7* 25.1* 26.1*  MCV 114.1*  < > 111.6* 102.8* 103.3* 104.1* 104.8*  PLT 111*  < > 123* 113* 115* 148* 183  < > = values in this interval not displayed. Cardiac Enzymes: No results found for this basename: CKTOTAL, CKMB, CKMBINDEX, TROPONINI,  in the last 168 hours BNP (last 3 results)  Recent Labs  08/13/14 2224  PROBNP 82.8   CBG: No results found for this basename: GLUCAP,  in the last 168 hours  Recent Results (from the past 240 hour(s))  URINE CULTURE     Status: None   Collection Time    08/13/14  11:02 PM      Result Value Ref Range Status   Specimen Description URINE, CATHETERIZED   Final   Special Requests NONE   Final   Culture  Setup Time     Final   Value: 08/14/2014 13:59     Performed at [facility]   Colony Count     Final   Value: NO GROWTH     Performed at [facility]   Culture     Final   Value: NO GROWTH     Performed at [facility]   Report Status 08/15/2014 FINAL   Final  CULTURE, BLOOD (ROUTINE X 2)     Status: None   Collection Time    08/14/14 12:13 AM      Result Value Ref Range Status   Specimen Description BLOOD LEFT ANTECUBITAL   Final   Special Requests     Final   Value: BOTTLES DRAWN AEROBIC AND ANAEROBIC AEB=2CC ANA=6CC   Culture NO GROWTH 5 DAYS   Final   Report Status 08/19/2014 FINAL   Final  CULTURE, BLOOD (ROUTINE X 2)     Status: None   Collection Time    08/14/14 12:13 AM      Result Value Ref Range Status   Specimen Description BLOOD LEFT HAND   Final   Special Requests     Final   Value: BOTTLES DRAWN AEROBIC AND ANAEROBIC AEB=6CC ANA=2CC   Culture NO GROWTH 5 DAYS   Final   Report Status 08/19/2014 FINAL   Final  URINE CULTURE     Status: None   Collection Time    08/16/14  5:15 PM      Result Value Ref Range Status   Specimen Description URINE, CLEAN CATCH BLOODY   Final   Special Requests CIPRO   Final   Culture  Setup Time     Final   Value: 08/17/2014 00:39     [facility]formed at [facility]   Colony Count     Final   Value: NO GROWTH     Performed at [facility]   Culture     Final   Value: NO GROWTH     Performed at [facility]   Report Status 08/17/2014 FINAL   Final     Studies: Ir Ivc Filter Plmt / S&i /img Guid/mod Sed  08/20/2014   CLINICAL DATA:  Right lower extremity DVT. Contraindication to anticoagulation due to gross hematuria.  EXAM: 1. ULTRASOUND GUIDANCE FOR VASCULAR  ACCESS OF THE RIGHT INTERNAL JUGULAR VEIN. 2. IVC VENOGRAM. 3. PERCUTANEOUS IVC FILTER  PLACEMENT.  ANESTHESIA/SEDATION: 0.5 mg IV Versed; 25 mcg IV Fentanyl.  Total Moderate Sedation Time  15minutes.  CONTRAST:  30 mL Omnipaque 300  FLUOROSCOPY TIME:  1 minutes and 12 seconds.  PROCEDURE: The procedure, risks, benefits, and alternatives were explained to the patient and his son. Questions regarding the procedure were encouraged and answered. Informed consent was obtained from the patient's son due to underlying encephalopathy.  The right neck was prepped with Betadine in a sterile fashion, and a sterile drape was applied covering the operative field. A sterile gown and sterile gloves were used for the procedure. A time-out procedure was performed. Local anesthesia was provided with 1% Lidocaine.  Ultrasound was used to confirm patency of the right internal jugular vein. Under direct ultrasound guidance, a 21 gauge needle was advanced into the right internal jugular vein with ultrasound image documentation performed. After securing access with a micropuncture dilator, a guidewire was advanced into the inferior vena cava. A deployment sheath was advanced over the guidewire. This was utilized to perform IVC venography.  The deployment sheath was further positioned in an appropriate location for filter deployment. A Bard Denali IVC filter was then advanced in the sheath. This was then fully deployed in the infrarenal IVC. Final filter position was confirmed with a fluoroscopic spot image. Contrast injection was also performed through the sheath under fluoroscopy to confirm patency of the IVC at the level of the filter. After the procedure the sheath was removed and hemostasis obtained with manual compression.  COMPLICATIONS: None.  FINDINGS: IVC venography demonstrates a normal caliber IVC with no evidence of thrombus. Renal veins are identified bilaterally. The IVC filter was successfully positioned below the level of the renal veins and is appropriately oriented. This IVC filter has both permanent and  retrievable indications.  IMPRESSION: Placement of percutaneous IVC filter in infrarenal IVC. IVC venogram shows no evidence of IVC thrombus and normal caliber of the inferior vena cava. This filter does have both permanent and retrievable indications.   Electronically Signed   By: Irish LackGlenn  Yamagata M.D.   On: 08/20/2014 16:30    Scheduled Meds: . sodium chloride   Intravenous Once  . sodium chloride   Intravenous Once  . cholecalciferol  400 Units Oral Daily  . cyanocobalamin  1,000 mcg Intramuscular Daily  . feeding supplement (ENSURE COMPLETE)  237 mL Oral BID BM  . folic acid  1 mg Oral Daily  . pantoprazole  40 mg Oral Daily  . prenatal multivitamin  1 tablet Oral Q1200  . thiamine  100 mg Oral Daily   Continuous Infusions: . sodium chloride 70 mL/hr at 08/21/14 1004    Principal Problem:   Acute encephalopathy Active Problems:   Adult failure to thrive   UTI (lower urinary tract infection)   Pancytopenia   Severe protein-calorie malnutrition   Vitamin B12 deficiency   Right leg DVT   Megaloblastic anemia due to B12 deficiency   Vitamin D deficiency   Malnutrition of moderate degree   Hematuria    Time spent: 35 minutes. Greater than 50% of this time was spent in direct contact with the patient coordinating care.    Chaya JanHERNANDEZ ACOSTA,ESTELA  [facility] Pager [phone]  If 7PM-7AM, please contact night-coverage at [email or web address], password [email or web address] 08/21/2014, 2:07 PM  LOS: 8 days

## 2014-08-21 NOTE — Progress Notes (Signed)
Physical Therapy Treatment Patient Details Name: Edwin [name] MRN: [id number] DOB: 06/26/1951 Today's Date: 08/21/2014    History of Present Illness      PT Comments    Increased distance with gait training with RW x 170' with min assistance for balance control.  VC-ing with gait for heel to toe pattern, equalize stance phase and stride length.  Pt with tendency to ambulate with NBOS, cueing to increase to [facility] [employer] [facility] [employer] for balance stability gait.  Included balance training activities including sidestepping and retro gait with HHA on handrails in hallway.  Pt limited by fatigue, no reports of pain through session.  Pt left in chair with call bell within reach and chair alarm set.  Follow Up Recommendations        Equipment Recommendations       Recommendations for Other Services       Precautions / Restrictions Precautions Precautions: Fall Precaution Comments: poor balance Restrictions Weight Bearing Restrictions: No    Mobility  Bed Mobility                  Transfers Overall transfer level: Needs assistance Equipment used: Rolling walker (2 wheeled) Transfers: Sit to/from Stand Sit to Stand: Min assist         General transfer comment: verbal cues for technique and hand placement. Poor balance and slow to process information.  Ambulation/Gait Ambulation/Gait assistance: Min assist Ambulation Distance (Feet): 170 Feet Assistive device: Rolling walker (2 wheeled)     Gait velocity interpretation: Below normal speed for age/gender General Gait Details: Pt staggered through gait training.  VC-ing for heel to toe, equalized stance phase and stride length, tendency to walk with NBOS, cueing to increased to [employer]OS for balance   Stairs            Wheelchair Mobility    Modified Rankin (Stroke Patients Only)       Balance    sidestepping and retro gait 2Rt with min assistance to reduce risk of falls                                Cognition  Arousal/Alertness: Awake/alert Behavior During Therapy: Flat affect Overall Cognitive Status: Within Functional Limits for tasks assessed                      Exercises Other Exercises Other Exercises: Sidestepping and retro gait 2RT beside handrails on wall     General Comments        Pertinent Vitals/Pain Pain Assessment: No/denies pain    Home Living                      Prior Function            PT Goals (current goals can now be found in the care plan section) Progress towards PT goals: Progressing toward goals    Frequency       PT Plan Current plan remains appropriate    Co-evaluation             End of Session Equipment Utilized During Treatment: Gait belt Activity Tolerance: Patient limited by fatigue Patient left: in chair;with call bell/phone within reach;with chair alarm set     Time: 4098-11911055-1125 PT Time Calculation (min): 30 min  Charges:  $Gait Training: 8-22 mins $Neuromuscular Re-education: 8-22 mins  G Codes:      [name], Edwin Jo 08/21/2014, 11:31 AM

## 2014-08-21 NOTE — Progress Notes (Signed)
The patient is receiving thiamine by the intravenous route.  Based on criteria approved by the Pharmacy and Therapeutics Committee and the Medical Executive Committee, the medication is being converted to the equivalent oral dose form.  These criteria include: -No Active GI bleeding -Able to tolerate diet of full liquids (or better) or tube feeding OR able to tolerate other medications by the oral or enteral route  If you have any questions about this conversion, please contact the Pharmacy Department (ext [phone]).  Thank you.  Elson ClanLilliston, Allisen Pidgeon Michelle, [facility] 08/21/2014 10:57 AM

## 2014-08-22 ENCOUNTER — Encounter (HOSPITAL_COMMUNITY): Payer: Self-pay | Admitting: Urology

## 2014-08-22 ENCOUNTER — Telehealth: Payer: Self-pay | Admitting: Family Medicine

## 2014-08-22 DIAGNOSIS — R319 Hematuria, unspecified: Secondary | ICD-10-CM | POA: Diagnosis not present

## 2014-08-22 DIAGNOSIS — G9349 Other encephalopathy: Secondary | ICD-10-CM | POA: Diagnosis not present

## 2014-08-22 DIAGNOSIS — N323 Diverticulum of bladder: Secondary | ICD-10-CM | POA: Diagnosis not present

## 2014-08-22 DIAGNOSIS — D61818 Other pancytopenia: Secondary | ICD-10-CM | POA: Diagnosis not present

## 2014-08-22 DIAGNOSIS — N390 Urinary tract infection, site not specified: Principal | ICD-10-CM

## 2014-08-22 DIAGNOSIS — R279 Unspecified lack of coordination: Secondary | ICD-10-CM | POA: Diagnosis not present

## 2014-08-22 DIAGNOSIS — E559 Vitamin D deficiency, unspecified: Secondary | ICD-10-CM

## 2014-08-22 DIAGNOSIS — R41841 Cognitive communication deficit: Secondary | ICD-10-CM | POA: Diagnosis not present

## 2014-08-22 DIAGNOSIS — R310 Gross hematuria: Secondary | ICD-10-CM | POA: Diagnosis not present

## 2014-08-22 DIAGNOSIS — G9340 Encephalopathy, unspecified: Secondary | ICD-10-CM | POA: Diagnosis not present

## 2014-08-22 DIAGNOSIS — M6281 Muscle weakness (generalized): Secondary | ICD-10-CM | POA: Diagnosis not present

## 2014-08-22 DIAGNOSIS — R627 Adult failure to thrive: Secondary | ICD-10-CM | POA: Diagnosis not present

## 2014-08-22 DIAGNOSIS — I82401 Acute embolism and thrombosis of unspecified deep veins of right lower extremity: Secondary | ICD-10-CM | POA: Diagnosis not present

## 2014-08-22 DIAGNOSIS — E46 Unspecified protein-calorie malnutrition: Secondary | ICD-10-CM | POA: Diagnosis not present

## 2014-08-22 DIAGNOSIS — D539 Nutritional anemia, unspecified: Secondary | ICD-10-CM | POA: Diagnosis not present

## 2014-08-22 HISTORY — DX: Adult failure to thrive: R62.7

## 2014-08-22 LAB — CBC
HCT: 25.7 % — ABNORMAL LOW (ref 39.0–52.0)
Hemoglobin: 8.3 g/dL — ABNORMAL LOW (ref 13.0–17.0)
MCH: 33.9 pg (ref 26.0–34.0)
MCHC: 32.3 g/dL (ref 30.0–36.0)
MCV: 104.9 fL — ABNORMAL HIGH (ref 78.0–100.0)
Platelets: 221 10*3/uL (ref 150–400)
RBC: 2.45 MIL/uL — ABNORMAL LOW (ref 4.22–5.81)
RDW: 21.4 % — AB (ref 11.5–15.5)
WBC: 5.5 10*3/uL (ref 4.0–10.5)

## 2014-08-22 LAB — BASIC METABOLIC PANEL
ANION GAP: 10 (ref 5–15)
BUN: 12 mg/dL (ref 6–23)
CHLORIDE: 109 meq/L (ref 96–112)
CO2: 26 mEq/L (ref 19–32)
CREATININE: 0.88 mg/dL (ref 0.50–1.35)
Calcium: 8.3 mg/dL — ABNORMAL LOW (ref 8.4–10.5)
GFR calc non Af Amer: 88 mL/min — ABNORMAL LOW (ref >90)
Glucose, Bld: 113 mg/dL — ABNORMAL HIGH (ref 70–99)
Potassium: 3.8 mEq/L (ref 3.7–5.3)
SODIUM: 145 meq/L (ref 137–147)

## 2014-08-22 MED ORDER — CIPROFLOXACIN HCL 500 MG PO TABS: 500.0000 mg | ORAL_TABLET | Freq: Two times a day (BID) | ORAL | Status: DC

## 2014-08-22 MED ORDER — PRENATAL MULTIVITAMIN CH: 1.0000 | ORAL_TABLET | Freq: Every day | ORAL | Status: DC

## 2014-08-22 MED ORDER — FOLIC ACID 1 MG PO TABS: 1.0000 mg | ORAL_TABLET | Freq: Every day | ORAL | Status: DC

## 2014-08-22 MED ORDER — CIPROFLOXACIN HCL 250 MG PO TABS
500.0000 mg | ORAL_TABLET | Freq: Two times a day (BID) | ORAL | Status: DC
  Administered 2014-08-22: 500 mg via ORAL
  Filled 2014-08-22: qty 2

## 2014-08-22 MED ORDER — CYANOCOBALAMIN 1000 MCG/ML IJ SOLN: 1000.0000 ug | Freq: Once | INTRAMUSCULAR | Status: DC

## 2014-08-22 MED ORDER — ENSURE COMPLETE PO LIQD: 237.0000 mL | Freq: Two times a day (BID) | ORAL | Status: DC

## 2014-08-22 MED ORDER — THIAMINE HCL 100 MG PO TABS: 100.0000 mg | ORAL_TABLET | Freq: Every day | ORAL | Status: DC

## 2014-08-22 MED ORDER — VITAMIN D 400 UNITS PO TABS: 400.0000 [IU] | ORAL_TABLET | Freq: Every day | ORAL | Status: DC

## 2014-08-22 NOTE — Progress Notes (Signed)
Occupational Therapy Treatment Patient Details Name: Edwin PewCalvin Mcconaghy MRN: [id number] DOB: 03/27/1951 Today's Date: 08/22/2014    History of present illness This is a 63 y.o. year old male with no prior medical care  presenting with encephalopathy and failure to thrive. Family states that pt lives alone. He has never seen a doctor per pt and family. Family states that pt has had progressive weakness, decreased appetite, confusion over past 2 months. No known prior history of medical problems. Pt denies any ETOH or tobacco abuse. Does report progressive weight loss and decreased appetite over extended period of time. Per report, pt was found covered in his own fecal material and urine  at home today by EMS   OT comments  Pt again unaware of having BM in bed.  Presented pt with option of standing at sink for OTR to preform toilet hygiene or for pt to sit/stand in bathroom to preform toilet hygiene. Pt requested ambulation to bathroom.  Was able to clean self with mod assist.  Pt had improved balance over yesterdays session, but still required consistent cueing for decreased knee and hip flexion.  Pt also had increased difficulty donning sock on RLE this session, due to increased edema.  RN informed of increase in RLE edema.  Pt left with legs elevated in chair.   Follow Up Recommendations  SNF    Equipment Recommendations  None recommended by OT    Recommendations for Other Services      Precautions / Restrictions Precautions Precautions: Fall Precaution Comments: poor balance Restrictions Weight Bearing Restrictions: No       Mobility Bed Mobility Overal bed mobility: Modified Independent                Transfers Overall transfer level: Needs assistance Equipment used: Rolling walker (2 wheeled) Transfers: Sit to/from [employer]Stand;Stand Pivot Transfers Sit to Stand: Min assist Stand pivot transfers: Min assist            Balance Overall balance assessment: Needs  assistance Sitting-balance support: No upper extremity supported;Feet supported Sitting balance-Leahy Scale: Good     Standing balance support: Bilateral upper extremity supported;During functional activity Standing balance-Leahy Scale: Poor                     ADL Overall ADL's : Needs assistance/impaired Eating/Feeding: Set up   Grooming: Standing;Cueing for safety;Moderate assistance;Wash/dry hands Grooming Details (indicate cue type and reason): assist for balance and finding soap, cueing for washing under nails (nails were soiled from toileting). continuous cues to maintain upright posture and decrease sinking in knees         Upper Body Dressing : Minimal assistance Upper Body Dressing Details (indicate cue type and reason): able to don gown. assist needed for IV line, and ties.  Pt wouldhave increased difficulty taking shirt over his head Lower Body Dressing: Moderate assistance Lower Body Dressing Details (indicate cue type and reason): increased assist needed this session for donning right sock, due to increased edema in RLE.   Toilet Transfer: Minimal assistance;RW;Regular Toilet;Grab bars;Ambulation;Cueing for safety;Cueing for sequencing Toilet Transfer Details (indicate cue type and reason): cueing for hand placement Toileting- Clothing Manipulation and Hygiene: Sit to/from stand;Cueing for sequencing;Moderate assistance Toileting - Clothing Manipulation Details (indicate cue type and reason): pt requested to attempt toilet hygiene this session in bathroom after BM in bed.  standing in front of toilet, pt was able to use toilet paper and damp washcloth to wipe. Required assist to finish clean-up.  Pt required  assist to clean hand while standing at the walker, prior to exiting bathroom. Rrequired consistent cueing for decreased knee flexion     Functional mobility during ADLs: Minimal assistance;Rolling walker;Cueing for safety;Cueing for sequencing        Vision                      Perception     Praxis      Cognition   Behavior During Therapy: Flat affect Overall Cognitive Status: Within Functional Limits for tasks assessed                       Extremity/Trunk Assessment               Exercises     Shoulder Instructions       General Comments      Pertinent Vitals/ Pain       Pain Assessment: No/denies pain  Home Living                                          Prior Functioning/Environment              Frequency Min 2X/week     Progress Toward Goals  OT Goals(current goals can now be found in the care plan section)  Progress towards OT goals: Progressing toward goals     Plan Discharge plan remains appropriate    Co-evaluation                 End of Session Equipment Utilized During Treatment: Gait belt;Rolling walker   Activity Tolerance Patient tolerated treatment well   Patient Left in chair;with chair alarm set;with call bell/phone within reach   Nurse Communication Other (comment) (Increased edema in RLE. Pt left in chair with legs elevated)        Time: [id number] OT Time Calculation (min): 52 min  Charges: OT General Charges $OT Visit: 1 Procedure OT Treatments $Self Care/Home Management : 38-52 mins   Ilda BassetMarie Rawlings Chayton Murata, MS, OTR/L [facility] [phone] 08/22/2014, 9:51 AM

## 2014-08-22 NOTE — Progress Notes (Signed)
Report called to Marcial PacasJean Weber, [facility].

## 2014-08-22 NOTE — Progress Notes (Signed)
Patient's IV removed.  Site WNL.  [facility]  Condom catheter intact for transport.  TED hose in place.  [facility]  Patient stable at time of discharge.

## 2014-08-22 NOTE — Discharge Summary (Addendum)
Physician Discharge Summary  Edwin PewCalvin [name] UJW:[id number]:[id number] DOB: 06/25/1951 DOA: 08/13/2014  PCP: No primary provider on file.  Admit date: 08/13/2014 Discharge date: 08/22/2014  Recommendations for Outpatient Follow-up:  1. Will need to establish with PCP for outpatient follow-up after SNF 2. Follow up low-grade hematuria, see discussion below, urology will arrange outpatient follow-up for definitive therapy. In Edwin meantime low-grade hematuria is expected. Suggest follow CBC to assess response to vitamin supplementation and trend hemoglobin. 3. Follow-up right lower extremity DVT, chronicity unclear. Because of hematuria resulting in transfusion, anticoagulation currently on hold, IVC filter has been placed. Would suggest reconsidering anticoagulation (apixaban) in Edwin near future based on urology therapy and CBC stability in Edwin setting of low-grade hematuria. Suggest compression hose both legs and elevation of Edwin right leg when sedentary. 4. Once stable on anticoagulation would suggest removal of IVC filter 5. Large bladder calculus, plans as above. 6. Suspected UTI. Culture unrevealing. Complete course of ciprofloxacin, then would recommend suppressive daily therapy. 7. Moderate malnutrition 8. Treat vitamin B12 deficiency 1 mg IM q week x4. Consider long-term therapy based on nutritional requirements and response to therapy.    Follow-up Information   Follow up with Edwin [name] M, MD. (office will call [name] with appointment)    Specialty:  Urology   Contact information:   [address] [place] [address] [phone]       Follow up with KEFALAS,THOMAS, PA-C. Schedule an appointment as soon as possible for a visit in 3 weeks.   Specialty:  Physician Assistant   Contact information:   [address] [place] [address] [phone]      Discharge Diagnoses:  1. Subacute encephalopathy, multifactorial 2. Right lower extremity DVT 3. Pancytopenia 4. Gross  hematuria 5. Acute blood loss anemia superimposed on macrocytic anemia, chronic 6. Suspected UTI 7. Large bladder calculus 8. Bladder diverticula 9. Moderate malnutrition  Discharge Condition: Improved Disposition: SNF  Diet recommendation: dysphagia 2, thin liquid  Filed Weights   08/20/14 0537 08/21/14 0627 08/22/14 0641  Weight: 76.5 kg (168 lb 10.4 oz) 77.3 kg (170 lb 6.7 oz) 78.9 kg (173 lb 15.1 oz)    History of present illness:  63 year old man with no recent or ongoing medical care who presented to Edwin emergency department with history of failure to thrive and encephalopathy for 2 months. No known substance abuse. Edwin [name]'s son found Edwin [name] covered in feces and urine and therefore Edwin [name] was sent to Edwin emergency department where he was admitted for encephalopathy, failure to thrive, UTI, anemia.   Hospital Course:  Mr. Edwin SilversmithWelch was seen in consultation with neurology and underwent further investigation including imaging and multiple laboratory studies. He end result of which was significant B1, B12 and vitamin D deficiency which were thought to be Edwin primary etiologies for his encephalopathy, complicated by UTI. Brain MRI was abnormal, in this context felt to be related to vitamin deficiency. There was no evidence of infection or acute CNS event and Edwin [name] improved with vitamin supplementation and treatment of suspected UTI. EEG was unremarkable. He was found to have a right lower extremity DVT (present on admission presumably) . Because of pancytopenia he was started on Lovenox without warfarin. He was seen by hematology with recommendations for vitamin replacement, Eliquis minimum 6 months and outpatient follow-up. 3 days into hospitalization developed gross hematuria which required total 3 units PRBC. This was thought to be acute on chronic secondary to Eliquis. Eliquis was stopped and IVC filter  was placed. [name] was seen by urology and underwent cystocopy.  Hematuria thought secondary to large bladder stone. Outpatient follow-up recommended as outlined. At this point his hemoglobin has been stable for 96 hours with only low-grade hematuria. Because of significant blood loss requiring transfusion and low-grade hematuria, anticoagulation is currently on hold, however it is hoped that this can be resumed in Edwin near future. Individual issues as below.  1. Gross hematuria, s/p cystoscopy 10/27. Thought secondary to bladder stone. No evidence of tumor. Hgb stable 4 days now s/p last transfusion 10/24. Discussed with Dr. Marylen Ponto low-grade hematuria is expected to continue until definitive therapy as an outpatient. From his perspective, no indication for further inpatient observation. 2. Large bladder calculus. Urology recommends eventual cystolithopaxy.  3. Bladder diverticula. Secondary to long-term bladder outlet obstruction. Prostate not felt to obstructive. Outpatient cystoscopy will be considered. 4. ABLA superimposed on macrocytic anemia (thought secondary to B12 deficiency), per urology thought to be multifactorial rather than secondary to hematuria. Secondary to B12 deficiency. Complicated by anticoagulation. Fecal occult blood negative. 5. UTI/pyuria. Thought secondary to infected stone. Plan treat with additional 7 days abx with long-term suppression to be considered until follow-up with urology 6. Multifactorial progressive encephalopathy, thought secondary to UTI, severe nutritional deficiencies. Resolved. Continue vitamin B1, B12 replacement. Thiamine deficieny may explain MRI findings. EEG was unremarkable. TSH WNL, RPR nonreactive. No further evaluation per neurology. 7. Abnormal MRI brain findings. Thought to be secondary to vitamin B1 deficiency as above, no history or clinical findings to suggest infection or acute CNS event clinically or by history. 8. RLE DVT. Etiology unclear. Anticoagulation discontinued secondary to hematuria  requiring transfusion. IVF filter placed 10/26. V/Q negative for PE. Hematology recommended Eliquis for 6 months at least. Outpatient follow-up with hematology in 3 weeks. Plan outpatient follow-up with urology for definitive therapy then would suggest resuming anticoagulation 9. Pancytopenia thought secondary to nutritional deficiencies (B12) 10. Moderate malnutrition, failure to thrive, with B1, B12, vitamin D deficiencies. TSH was normal.  11. Suspected underlying dementia.  Discussed with urology Dr. Retta Diones. Outpatient follow-up will be arranged by urology for outpatient cystoscopy, possible cystolitholapaxy, bladder biopsy and retrograde ureteropyelograms. In Edwin meantime low-grade ongoing hematuria is expected and no treatment until f/u is indicated. CBC has been stable. Recommend follow CBC as an outpatient.  Once [name] has had definitive outpatient urology evaluation would suggest resuming apixaban 10 mg by mouth twice a day for 7 days then 5 mg by mouth twice a day thereafter, with follow-up with [facility] in 3 weeks recommended. Place compression hose.  Treat vitamin B12 deficiency 1 mg IM q week x4. Consider long-term therapy based on nutritional requirements. Discussed in detail with Jenita Seashore, updated on hematuria and withholding anticoagulation. He will f/u in office in 2-3 weeks. I discussed in detail with Edwin [name]'s son ongoing low-grade hematuria, stability of hemoglobin and urology recommendations. He understands Edwin [name]'s likely to continue to have low-grade bleeding until definitive therapy but no further inpatient recommendations at this time.  Consultants:  Urology  Neurology  Hematology  Interventional radiology  PT  OT--SNF Procedures:  10/23 1 unit PRBC  10/24 2 unit PRBC  10/26 IVC filter placement  EEG IMPRESSION:  1. Mild generalized slowing for age. Otherwise, unremarkable.   Antibiotics:  Ciprofloxacin 10/21 >> 11/3  Discharge  Instructions   Current Discharge Medication List    START taking these medications   Details  cholecalciferol 400 UNITS tablet Take 1 tablet (400 Units total)  by mouth daily.    ciprofloxacin (CIPRO) 500 MG tablet Take 1 tablet (500 mg total) by mouth 2 (two) times daily. Last dose 11/3.    cyanocobalamin (,VITAMIN B-12,) 1000 MCG/ML injection Inject 1 mL (1,000 mcg total) into Edwin muscle once. Once every week for total four doses. First dose November 3rd 2015.    feeding supplement, ENSURE COMPLETE, (ENSURE COMPLETE) LIQD Take 237 mLs by mouth 2 (two) times daily between meals.    folic acid (FOLVITE) 1 MG tablet Take 1 tablet (1 mg total) by mouth daily.    Prenatal Vit-Fe Fumarate-FA (PRENATAL MULTIVITAMIN) TABS tablet Take 1 tablet by mouth daily at 12 noon.    thiamine 100 MG tablet Take 1 tablet (100 mg total) by mouth daily.       No Known Allergies Edwin results of significant diagnostics from this hospitalization (including imaging, microbiology, ancillary and laboratory) are listed below for reference.    Significant Diagnostic Studies: Ct Abdomen Pelvis Wo Contrast  08/14/2014   CLINICAL DATA:  Abdominal pain, generalized. Chest pain. Incontinence for 2 months.  EXAM: CT CHEST, ABDOMEN AND PELVIS WITHOUT CONTRAST  TECHNIQUE: Multidetector CT imaging of Edwin chest, abdomen and pelvis was performed following Edwin standard protocol without IV contrast.  COMPARISON:  None.  FINDINGS: CT CHEST FINDINGS  Normal heart size. Normal caliber thoracic aorta. Esophagus is decompressed. Small esophageal hiatal hernia. No significant lymphadenopathy in Edwin chest.  Focal wedge-shaped areas of atelectasis or scarring in Edwin right middle and lower lungs. No airspace disease in Edwin lungs. No pneumothorax. No pleural effusions.  CT ABDOMEN AND PELVIS FINDINGS  Cholelithiasis without additional inflammatory changes. Diffuse fatty infiltration of Edwin pancreas. Edwin unenhanced appearance of Edwin liver,  spleen, adrenal glands, kidneys, abdominal aorta, inferior vena cava, and retroperitoneal lymph nodes is unremarkable. Stomach, small bowel, and colon are decompressed. No free fluid or free air in Edwin abdomen.  Pelvis: Edwin bladder wall is diffusely thickened and there is infiltration in Edwin fat around Edwin bladder. Multiple bladder diverticula, including a large diverticulum arising from Edwin dome of Edwin bladder. Increased density in Edwin posterior bladder and extending into a diverticulum, likely representing a bladder stone. Findings suggest cystitis, possibly associated with chronic bladder outlet obstruction. Enlarged lymph nodes are present adjacent to Edwin bladder. Neoplastic involvement is not excluded. Prostate gland is not significantly enlarged. No free or loculated pelvic fluid collections are demonstrated. Appendix is normal. No evidence of diverticulitis. Degenerative changes in Edwin thoracic and lumbar spine. No destructive bone lesions.  IMPRESSION: Multiple wedge-shaped areas of atelectasis or scarring demonstrated in Edwin right middle and lower lungs.  Thickening of Edwin bladder wall with infiltration in Edwin fat around Edwin bladder. It multiple bladder diverticula. Bladder stones. Findings likely due to cystitis, possibly associated with chronic bladder outlet obstruction. Enlarged lymph nodes in Edwin pelvis. Bladder neoplasm not excluded.   Electronically Signed   By: Burman Nieves [name].D.   On: 08/14/2014 02:39   Dg Chest 2 View  08/15/2014   CLINICAL DATA:  Deep venous thrombosis.  EXAM: CHEST  2 VIEW  COMPARISON:  August 13, 2014.  FINDINGS: Edwin heart size and mediastinal contours are within normal limits. Both lungs are clear. No pneumothorax is noted. Minimal bilateral pleural effusions are noted posteriorly. Edwin visualized skeletal structures are unremarkable.  IMPRESSION: Minimal bilateral pleural effusions. No other significant abnormality seen in Edwin chest.   Electronically Signed   By: Roque Lias [name].D.   On: 08/15/2014 10:51  Dg Chest 2 View  08/13/2014   CLINICAL DATA:  Failure to thrive. A initial complaint to EMS was chest pain. [name] has altered mental status.  EXAM: CHEST  2 VIEW  COMPARISON:  None.  FINDINGS: Cardiac silhouette is normal in size. Normal mediastinal and hilar contours.  Clear lungs.  No pleural effusion or pneumothorax.  Bony thorax is intact.  IMPRESSION: No active cardiopulmonary disease.   Electronically Signed   By: Amie Portland [name].D.   On: 08/13/2014 22:56   Ct Head Wo Contrast  08/13/2014   CLINICAL DATA:  63 year old male found with altered mental status, dementia like activity. Initial encounter.  EXAM: CT HEAD WITHOUT CONTRAST  TECHNIQUE: Contiguous axial images were obtained from Edwin base of Edwin skull through Edwin vertex without intravenous contrast.  COMPARISON:  None.  FINDINGS: Mild paranasal sinus mucosal thickening. Mastoids are clear. No acute osseous abnormality identified. Visualized orbits and scalp soft tissues are within normal limits.  Cerebral volume is within normal limits for age. No suspicious intracranial vascular hyperdensity. No midline shift, ventriculomegaly, mass effect, evidence of mass lesion, intracranial hemorrhage or evidence of cortically based acute infarction. Gray-white matter differentiation is within normal limits throughout Edwin brain.  IMPRESSION: Normal for age non contrast CT appearance of Edwin brain.   Electronically Signed   By: Augusto Gamble [name].D.   On: 08/13/2014 23:03   Mr Brain Wo Contrast  08/14/2014   CLINICAL DATA:  63 year old male with encephalopathy, failure to thrive, progressive weakness and confusion for Edwin past 2 months. Subsequent encounter.  EXAM: MRI HEAD WITHOUT CONTRAST  TECHNIQUE: Multiplanar, multiecho pulse sequences of Edwin brain and surrounding structures were obtained without intravenous contrast.  COMPARISON:  08/13/2014 CT.  No comparison MR.  FINDINGS: Exam is motion degraded.  No acute infarct.   No intracranial hemorrhage.  Questionable subtle T2 increased signal within Edwin uncus/hippocampus and subinsular region bilaterally. With this distribution, infection such as herpes encephalitis cannot be excluded. There are however, no changes on Edwin diffusion sequence as can be seen with herpes encephalitis and [name]'s symptoms have been going on for 2 months. Clinical and laboratory correlation recommended. Limbic encephalitis can present with a similar appearance. Result of metabolic abnormality (vitamin-B deficiency) is a less likely consideration. Primary brain tumor felt unlikely. If Edwin [name] had persistent or progressive symptoms, close followup MR with contrast may be considered.  Patchy and punctate white matter type changes consistent with result of small vessel disease.  No hydrocephalus  No intracranial mass lesion seen from above described findings.  C3-4 disc protrusion with spinal stenosis and mild cord flattening.  Mild paranasal sinus mucosal thickening.  Major intracranial vascular structures are patent.  Small pituitary gland. Pineal region unremarkable. Orbital structures unremarkable.  IMPRESSION: Exam is motion degraded.  No acute infarct.  Questionable subtle T2 increased signal within Edwin uncus/hippocampus and subinsular region bilaterally. With this distribution, infection such as herpes encephalitis cannot be excluded. There are however, no changes on Edwin diffusion sequence as can be seen with herpes encephalitis and [name]'s symptoms have been going on for 2 months. Clinical and laboratory correlation recommended.  Limbic encephalitis can present with a similar appearance. Result of metabolic abnormality (vitamin-B deficiency) is a less likely consideration. Primary brain tumor felt unlikely. If Edwin [name] had persistent or progressive symptoms, close followup MR with contrast may be considered.  Mild small vessel disease.  C3-4 disc protrusion with spinal stenosis and mild cord  flattening.  These results were called by  telephone at Edwin time of interpretation on 08/14/2014 at 9:24 am to Dr. Doree AlbeeSTEVEN NEWTON , who verbally acknowledged these results.   Electronically Signed   By: Bridgett LarssonSteve  Olson [name].D.   On: 08/14/2014 09:37   Ir Ivc Filter Plmt / S&i /img Guid/mod Sed  08/20/2014   CLINICAL DATA:  Right lower extremity DVT. Contraindication to anticoagulation due to gross hematuria.  EXAM: 1. ULTRASOUND GUIDANCE FOR VASCULAR ACCESS OF Edwin RIGHT INTERNAL JUGULAR VEIN. 2. IVC VENOGRAM. 3. PERCUTANEOUS IVC FILTER PLACEMENT.  ANESTHESIA/SEDATION: 0.5 mg IV Versed; 25 mcg IV Fentanyl.  Total Moderate Sedation Time  15minutes.  CONTRAST:  30 mL Omnipaque 300  FLUOROSCOPY TIME:  1 minutes and 12 seconds.  PROCEDURE: Edwin procedure, risks, benefits, and alternatives were explained to Edwin [name] and his son. Questions regarding Edwin procedure were encouraged and answered. Informed consent was obtained from Edwin [name]'s son due to underlying encephalopathy.  Edwin right neck was prepped with Betadine in a sterile fashion, and a sterile drape was applied covering Edwin operative field. A sterile gown and sterile gloves were used for Edwin procedure. A time-out procedure was performed. Local anesthesia was provided with 1% Lidocaine.  Ultrasound was used to confirm patency of Edwin right internal jugular vein. Under direct ultrasound guidance, a 21 gauge needle was advanced into Edwin right internal jugular vein with ultrasound image documentation performed. After securing access with a micropuncture dilator, a guidewire was advanced into Edwin inferior vena cava. A deployment sheath was advanced over Edwin guidewire. This was utilized to perform IVC venography.  Edwin deployment sheath was further positioned in an appropriate location for filter deployment. A Bard Denali IVC filter was then advanced in Edwin sheath. This was then fully deployed in Edwin infrarenal IVC. Final filter position was confirmed with a fluoroscopic  spot image. Contrast injection was also performed through Edwin sheath under fluoroscopy to confirm patency of Edwin IVC at Edwin level of Edwin filter. After Edwin procedure Edwin sheath was removed and hemostasis obtained with manual compression.  COMPLICATIONS: None.  FINDINGS: IVC venography demonstrates a normal caliber IVC with no evidence of thrombus. Renal veins are identified bilaterally. Edwin IVC filter was successfully positioned below Edwin level of Edwin renal veins and is appropriately oriented. This IVC filter has both permanent and retrievable indications.  IMPRESSION: Placement of percutaneous IVC filter in infrarenal IVC. IVC venogram shows no evidence of IVC thrombus and normal caliber of Edwin inferior vena cava. This filter does have both permanent and retrievable indications.   Electronically Signed   By: Irish LackGlenn  Yamagata [name].D.   On: 08/20/2014 16:30   Nm Pulmonary Perf And Vent  08/15/2014   CLINICAL DATA:  Altered mental status. No current shortness of breath or chest pain.  EXAM: NUCLEAR MEDICINE VENTILATION - PERFUSION LUNG SCAN  TECHNIQUE: Ventilation images were obtained in multiple projections using inhaled aerosol technetium 99 [name] DTPA. Perfusion images were obtained in multiple projections after intravenous injection of Tc-148m MAA.  RADIOPHARMACEUTICALS:  40.0 mCi Tc-588m DTPA aerosol and 6.0 mCi Tc-128m MAA  COMPARISON:  Chest radiograph, 08/15/2014.  FINDINGS: Ventilation: No focal ventilation defect.  Perfusion: No wedge shaped peripheral perfusion defects to suggest acute pulmonary embolism.  IMPRESSION: No evidence of a pulmonary embolism.   Electronically Signed   By: Amie Portlandavid  Ormond [name].D.   On: 08/15/2014 11:45   [place] Venous Img Lower Unilateral Right  08/14/2014   CLINICAL DATA:  Right lower extremity pain and edema. Evaluate for DVT.  EXAM: RIGHT LOWER  EXTREMITY VENOUS DOPPLER ULTRASOUND  TECHNIQUE: Gray-scale sonography with graded compression, as well as color Doppler and duplex ultrasound were  performed to evaluate Edwin lower extremity deep venous systems from Edwin level of Edwin common femoral vein and including Edwin common femoral, femoral, profunda femoral, popliteal and calf veins including Edwin posterior tibial, peroneal and gastrocnemius veins when visible. Edwin superficial great saphenous vein was also interrogated. Spectral Doppler was utilized to evaluate flow at rest and with distal augmentation maneuvers in Edwin common femoral, femoral and popliteal veins.  COMPARISON:  None.  FINDINGS: There is a minimal amount of eccentric mixed echogenic nonocclusive thrombus within right common femoral vein (images 2 and 4). Nonocclusive thrombus extends to involve Edwin imaged portions of Edwin proximal (images 13 and 14), mid (images 16 and 17) and distal (images 21 and 22) aspects of Edwin right superficial femoral vein.  Edwin right deep femoral and popliteal veins are widely patent. Edwin greater saphenous vein appears widely patent throughout its imaged course.  Incidental note is made of a serpiginous approximately 2.8 x 0.7 cm fluid collection within Edwin right popliteal fossa favored to represent Baker's cyst.  There is subcutaneous edema at Edwin level of Edwin calf. Note is made of two benign appearing right inguinal lymph nodes which are not enlarged by size criteria and maintain benign fatty hila.  IMPRESSION: 1. Examination is positive for nonocclusive thrombus extending from Edwin level of Edwin right common femoral vein to Edwin distal aspect of Edwin right superficial femoral vein. 2. Incidental note made of an approximately 2.8 cm right-sided Baker cyst. These results will be called to Edwin ordering clinician or representative by Edwin Radiologist Assistant, and communication documented in Edwin PACS or zVision Dashboard.   Electronically Signed   By: Simonne Come [name].D.   On: 08/14/2014 17:15    Microbiology: Recent Results (from Edwin past 240 hour(s))  URINE CULTURE     Status: None   Collection Time    08/13/14 11:02  PM      Result Value Ref Range Status   Specimen Description URINE, CATHETERIZED   Final   Special Requests NONE   Final   Culture  Setup Time     Final   Value: 08/14/2014 13:59     Performed at [facility]   Colony Count     Final   Value: NO GROWTH     Performed at [facility]   Culture     Final   Value: NO GROWTH     Performed at [facility]   Report Status 08/15/2014 FINAL   Final  CULTURE, BLOOD (ROUTINE X 2)     Status: None   Collection Time    08/14/14 12:13 AM      Result Value Ref Range Status   Specimen Description BLOOD LEFT ANTECUBITAL   Final   Special Requests     Final   Value: BOTTLES DRAWN AEROBIC AND ANAEROBIC AEB=2CC ANA=6CC   Culture NO GROWTH 5 DAYS   Final   Report Status 08/19/2014 FINAL   Final  CULTURE, BLOOD (ROUTINE X 2)     Status: None   Collection Time    08/14/14 12:13 AM      Result Value Ref Range Status   Specimen Description BLOOD LEFT HAND   Final   Special Requests     Final   Value: BOTTLES DRAWN AEROBIC AND ANAEROBIC AEB=6CC ANA=2CC   Culture NO GROWTH 5 DAYS   Final  Report Status 08/19/2014 FINAL   Final  URINE CULTURE     Status: None   Collection Time    08/16/14  5:15 PM      Result Value Ref Range Status   Specimen Description URINE, CLEAN CATCH BLOODY   Final   Special Requests CIPRO   Final   Culture  Setup Time     Final   Value: 08/17/2014 00:39     Performed at [facility] Count     Final   Value: NO GROWTH     Performed at [facility]   Culture     Final   Value: NO GROWTH     Performed at [facility]   Report Status 08/17/2014 FINAL   Final     Labs: Basic Metabolic Panel:  Recent Labs Lab 08/16/14 0248 08/17/14 0805 08/18/14 0500 08/20/14 0549 08/22/14 0606  NA 140 142 144 144 145  K 3.3* 4.0 4.2 4.0 3.8  CL 105 109 110 109 109  CO2 25 26 26 26 26   GLUCOSE 102* 112* 97 97 113*  BUN 12 7 6 9 12   CREATININE 0.90 1.00 1.00 1.00 0.88    CALCIUM 8.2* 8.7 9.1 8.4 8.3*  MG  --  2.0  --   --   --   PHOS  --  2.1*  --   --   --    Liver Function Tests:  Recent Labs Lab 08/15/14 1353  PROT 6.3   CBC:  Recent Labs Lab 08/18/14 1908 08/19/14 0603 08/20/14 0549 08/21/14 0552 08/22/14 0606  WBC 4.7 4.7 4.6 5.2 5.5  HGB 8.9* 8.3* 8.4* 8.5* 8.3*  HCT 25.8* 24.7* 25.1* 26.1* 25.7*  MCV 102.8* 103.3* 104.1* 104.8* 104.9*  PLT 113* 115* 148* 183 221     Recent Labs  08/13/14 2224  PROBNP 82.8    Principal Problem:   Acute encephalopathy Active Problems:   Adult failure to thrive   UTI (lower urinary tract infection)   Pancytopenia   Severe protein-calorie malnutrition   Vitamin B12 deficiency   Right leg DVT   Megaloblastic anemia due to B12 deficiency   Vitamin D deficiency   Malnutrition of moderate degree   Hematuria   Time coordinating discharge: 45 minutes  Signed:  Brendia Sacks, MD [facility] 08/22/2014, 11:42 AM

## 2014-08-22 NOTE — Clinical Social Work Placement (Signed)
Clinical Social Work Department CLINICAL SOCIAL WORK PLACEMENT NOTE 08/22/2014  [name]  Account Number:  [id number] Admit date:  08/13/2014  Clinical Social Worker:  Derenda FennelKARA Yazmin Locher, LCSW  Date/time:  08/16/2014 12:18 PM  Clinical Social Work is seeking post-discharge placement for this patient at the following level of care:   SKILLED NURSING   (*CSW will update this form in Epic as items are completed)   08/16/2014  Patient/family provided with [facility] Department of Clinical Social Work's list of facilities offering this level of care within the geographic area requested by the patient (or if unable, by the patient's family).  08/16/2014  Patient/family informed of their freedom to choose among providers that offer the needed level of care, that participate in Medicare, Medicaid or managed care program needed by the patient, have an available bed and are willing to accept the patient.  08/16/2014  Patient/family informed of [facility]' ownership interest in [facility], as well as of the fact that they are under no obligation to receive care at this facility.  PASARR submitted to EDS on 08/16/2014 PASARR number received on 08/16/2014  FL2 transmitted to all facilities in geographic area requested by pt/family on  08/16/2014 FL2 transmitted to all facilities within larger geographic area on   Patient informed that his/her managed care company has contracts with or will negotiate with  certain facilities, including the following:     Patient/family informed of bed offers received:  08/17/2014 [facility] Physician recommends and patient chooses bed at    [facility] on  08/22/2014 Patient to be transferred to facility by facility [facility] Patient and family notified of transfer on 08/22/2014 Name of family member notified:  Jerilynn Somalvin- son by voicemail  The following  physician request were entered in Epic:   Additional Comments:  Derenda FennelKara My Rinke, LCSW [phone]

## 2014-08-22 NOTE — Clinical Social Work Note (Addendum)
[facility]. Pt and facility aware and agreeable. Pt's son, [name] notified by voicemail. D/C summary faxed. Pt to transport via facility van between 2:30-3 today. RN aware.   Derenda FennelKara [place], [place] [phone]

## 2014-08-22 NOTE — Progress Notes (Signed)
PROGRESS NOTE  Edwin [name] ZOX:[id number] DOB: 11/19/1950 DOA: 08/13/2014 PCP: No primary provider on file. None  Summary: 63 year old man with no recent or ongoing medical care who presented to the emergency department with history of failure to thrive and encephalopathy for 2 months. No known substance abuse. The patient's son found the patient covered in feces and urine and therefore the patient was sent to the emergency department where he was admitted for encephalopathy, failure to thrive, UTI, anemia. He was subsequently found to have a right lower extremity DVT. Because of pancytopenia he was started on Lovenox without warfarin. Further investigation revealed B12 deficiency. He was seen by hematology with recommendations for vitamin replacement, Eliquis minimum 6 months and outpatient follow-up. 3 days into hospitalization developed hematuria which required total 3 units PRBC. Eliquis was stopped and IVC filter was placed. Patient was seen by urology and underwent cystocopy. Hematuria thought secondary to large bladder stone. Outpatient follow-up recommended.  Assessment/Plan: 1. Gross hematuria, s/p cystoscopy 10/27. Thought secondary to bladder stone. No evidence of tumor. Hgb stable 4 days now s/p last transfusion 10/24. Discussed with Dr. Marylen Pontoahlstedt--ongoing low-grade hematuria is expected to continue until definitive therapy as an outpatient. From his perspective, no indication for further inpatient observation. 2. Large bladder calculus. Urology recommends eventual cystolithopaxy.  3. Bladder diverticula. Secondary to long-term bladder outlet obstruction. Prostate not felt to obstructive. Outpatient cystoscopy will be considered. 4. ABLA superimposed on macrocytic anemia (thought secondary to B12 deficiency), per urology thought to be multifactorial rather than secondary to hematuria. Secondary to B12 deficiency. Complicated by anticoagulation. Fecal occult blood negative 5. UTI/pyuria.  Thought secondary to infected stone. Plan treat with additional 7 days abx with long-term suppression to be considered until follow-up with urology 6. Multifactorial progressive encephalopathy, thought secondary to UTI, severe nutritional deficiencies. Continue vitamin B1, B12 replacement. Thiamine deficieny may explain MRI findings. EEG was unremarkable. TSH WNL, RPR nonreactive. No further evaluation per neurology. 7. Abnormal MRI brain findings. Thought to be secondary to vitamin B1 deficiency as above, no history or clinical findings to suggest infection. 8. RLE DVT. Etiology unclear. Anticoagulation discontinued secondary to hematuria. IVF filter placed 10/26. V/Q negative for PE. Hematology recommended eloquence for 6 months at least. Outpatient follow-up with hematology in 3 weeks. Plan outpatient follow-up with urology for definitive therapy then would suggest resuming anticoagulation 9. Pancytopenia thought secondary to nutritional deficiencies (B12) 10. Moderate malnutrition, failure to thrive, with B1, B12, vitamin D deficiencies. TSH was normal.  11. Suspected underlying dementia.    Overall much improved. Responding to vitamin replacement. Encephalopathy has resolved.   Discussed with urology. Outpatient follow-up will be arranged by urology for outpatient cystoscopy, possible cystolitholapaxy, bladder biopsy and retrograde ureteropyelograms. In the meantime low-grade ongoing hematuria is expected and no treatment until f/u is indicated. CBC has been stable. Recommend follow CBC as an outpatient.  Once patient has had definitive outpatient urology evaluation would suggest resuming apixaban 10 mg by mouth twice a day for 7 days then 5 mg by mouth twice a day thereafter, with follow-up with [facility] in 3 weeks recommended. Place TED hose.  Treatment vitamin B12 deficiency 1 mg IM q week x4. Consider long-term therapy based on nutritional requirements.  Code Status: full code DVT  prophylaxis: SCDs, Ted hose Family Communication: discussed in detail with son Disposition Plan: SNF  Brendia Sacksaniel Goodrich, MD  [facility]  Pager [phone] If 7PM-7AM, please contact night-coverage at [email or web address], password [email or web address] 08/22/2014, 9:02 AM  LOS: 9 days  Consultants:  Urology  Neurology   Hematology   Interventional radiology  PT  OT--SNF  Procedures:  10/23 1 unit PRBC  10/24 2 unit PRBC   10/26 IVC filter placement  EEG IMPRESSION:  1. Mild generalized slowing for age. Otherwise, unremarkable.  Antibiotics:  Ciprofloxacin 10/21 >> 11/3  HPI/Subjective: Walked 170' with walker. Improved balance per OT. Underwent cystoscopy yesterday which revealed bladder stone.  No complaints, no pain.  Objective: Filed Vitals:   08/21/14 1715 08/21/14 2019 08/22/14 0617 08/22/14 0641  BP: 152/66 121/52 126/59   Pulse:  70 68   Temp:  97.8 F (36.6 C) 98.9 F (37.2 C)   TempSrc:  Oral Oral   Resp:  16 18   Height:      Weight:    78.9 kg (173 lb 15.1 oz)  SpO2:  99% 99%     Intake/Output Summary (Last 24 hours) at 08/22/14 0902 Last data filed at 08/22/14 0729  Gross per 24 hour  Intake    840 ml  Output    522 ml  Net    318 ml     Filed Weights   08/20/14 0537 08/21/14 0627 08/22/14 0641  Weight: 76.5 kg (168 lb 10.4 oz) 77.3 kg (170 lb 6.7 oz) 78.9 kg (173 lb 15.1 oz)    Exam:     Afebrile, VSS  General: Appears calm, comfortable. Sitting in chair.  Psych: Alert. Speech fluent and appropriate. Oriented to self, location, month, year.  Eyes: Pupils equal, round, reactive to light.  CV: Regular rate and rhythm. No murmur, rub or gallop. Right lower extremity edema 2+. No left lower extremity edema. BLE DP pulses 2+. Telemetry SR  Respiratory: CTA bilaterally no w/r/r. Normal respiratory effort.  Abdomen: soft, ntnd  Musculoskeletal: moves all extremities well, grossly normal tone and strength all extremities  Neuro: grossly  nonfocal  Data Reviewed:  UOP 522, BM x1  BMP unremarkable   Hgb stable 8.3  Scheduled Meds: . sodium chloride   Intravenous Once  . sodium chloride   Intravenous Once  . cholecalciferol  400 Units Oral Daily  . cyanocobalamin  1,000 mcg Intramuscular Daily  . feeding supplement (ENSURE COMPLETE)  237 mL Oral BID BM  . folic acid  1 mg Oral Daily  . pantoprazole  40 mg Oral Daily  . prenatal multivitamin  1 tablet Oral Q1200  . thiamine  100 mg Oral Daily   Continuous Infusions: . sodium chloride 70 mL/hr at 08/22/14 0131    Principal Problem:   Acute encephalopathy Active Problems:   Adult failure to thrive   UTI (lower urinary tract infection)   Pancytopenia   Severe protein-calorie malnutrition   Vitamin B12 deficiency   Right leg DVT   Megaloblastic anemia due to B12 deficiency   Vitamin D deficiency   Malnutrition of moderate degree   Hematuria

## 2014-08-23 NOTE — Telephone Encounter (Signed)
[facility] and I advised patient [facility] and that they have a provider at that facility and he could probably be seen by there physician right now and then when he is discharged from the nursing home they will call to schedule an appointment with our facility for him as a new patient.

## 2014-08-27 ENCOUNTER — Non-Acute Institutional Stay (SKILLED_NURSING_FACILITY): Payer: Medicare Other | Admitting: Internal Medicine

## 2014-08-27 DIAGNOSIS — G320 Subacute combined degeneration of spinal cord in diseases classified elsewhere: Secondary | ICD-10-CM | POA: Diagnosis not present

## 2014-08-27 DIAGNOSIS — G9340 Encephalopathy, unspecified: Secondary | ICD-10-CM

## 2014-08-27 DIAGNOSIS — I82401 Acute embolism and thrombosis of unspecified deep veins of right lower extremity: Secondary | ICD-10-CM | POA: Diagnosis not present

## 2014-08-27 DIAGNOSIS — N210 Calculus in bladder: Secondary | ICD-10-CM

## 2014-08-27 DIAGNOSIS — E538 Deficiency of other specified B group vitamins: Secondary | ICD-10-CM

## 2014-08-29 NOTE — Progress Notes (Signed)
Patient ID: Edwin [name], male   DOB: 01/19/1951, 63 y.o.   MRN: [id number]               HISTORY & PHYSICAL  DATE:  08/27/2014    [facility]    LEVEL OF CARE:   SNF   CHIEF COMPLAINT:  [facility], 08/[facility] through 08/22/2014.    HISTORY OF PRESENT ILLNESS:  This is a gentleman who underwent a recent complex hospitalization.  He apparently was found at home by his son covered in urine and feces.  He was admitted with what was felt to be encephalopathy, progressive failure to thrive for up to two months.  He had no known substance abuse history.    He underwent an MRI of the brain which showed subtle T2 changes within the uncus and hippocampus. felt to be possibly encephalitis, metabolic abnormalities such as vitamin B12, brain tumor felt to be unlikely.     He also had a cervical spine film that showed mild T3-T4 disk protrusion with spinal stenosis and mild cord flattening.    His major abnormality, however, was macrocytic anemia with a hemoglobin of 9 at presentation, an MCV of 114.8.  Also, ultimately he developed mild pancytopenia with mildly low white count and platelet count.   His differential count was normal.  In work-up for the anemia, his vitamin B12 level was profoundly low at less than 10.  His methylmalonic acid level was elevated at over 9800.  His 125-hydroxy vitamin D level was less than 8.  I am not exactly sure how he was started on vitamin B12, although he is discharged here on 1 mg IM weekly x4.  His hemoglobin at hospital discharge on 08/22/2014 was 8.3, white count 5.5, platelet count of 221,000.  His serum iron level was 50, TIBC at 187, ferritin at 254.  Protein electrophoresis did not show an M-spike.    Comprehensive metabolic panel on 08/15/2014 was essentially unremarkable other than an albumin level of 3, calcium level at 8.5.    During the hospitalization, he was also discovered to have an extensive right leg DVT.  He was given  anticoagulation.   However, he developed gross hematuria requiring transfusion.  He, therefore, had an IVC filter placed.  The anticoagulation was put on hold.    He was seen by Urology and was felt to have a bladder stone causing the bleeding.  No overt bladder tumors seen on cystoscopy.  There was also bladder diverticula.  He was not felt to have significant prostate obstruction.  It was recommended to have repeat cystoscopy, bladder biopsy, retrograde ureteropyelograms, and possible lithotripsy.  His anticoagulation is on hold until then.    PAST MEDICAL HISTORY/PROBLEM LIST:  Discharge diagnoses:    Subacute encephalopathy.    Extensive right lower extremity DVT, with anticoagulation on hold and IVC filter in place.   See discussion above.    Pancytopenia.  Felt to be secondary to B12 deficiency.    Macrocytic anemia.  Due to B12 deficiency.    Gross hematuria.   See discussion above.      Acute blood loss anemia.  Due to anticoagulation, requiring transfusion.    Large bladder calculus.    Bladder diverticula.    Moderate protein calorie malnutrition.    Severe gait ataxia.    Status post IVC filter placement.    Suspect UTI.  However, culture was unrevealing.  He completed a course of ciprofloxacin with suppressive therapy suggested.  Abnormal brain MRI.  Felt to be secondary to B1 deficiency.  Vitamin B1 level was less than 7 with a range of 8-30 nanomoles per liter.    CURRENT MEDICATIONS:  Discharge medications include:      Vitamin D 400 U, 1 tablet daily.    Ciprofloxacin 500 b.i.d., with the last dose on 08/28/2014.    Vitamin B12, 1000 mcg once every week for a total of four doses.    Folic acid 1 mg daily.    Thiamine 100 mg daily.    SOCIAL HISTORY:  I have no current information.    FAMILY HISTORY:  Not available.     REVIEW OF SYSTEMS:   HEENT:  The patient denies headache.   CHEST/RESPIRATORY:  No shortness of breath.  CARDIAC:   No chest pain.     GI:  No nausea, vomiting, or altered bowel habits.   GU:  States he has no current hematuria or dysuria.   NEUROLOGICAL:   He states he has been unsteady on his feet for several months, although I am not really able to quantitate this.  He denies sensory changes or gross motor weakness.    PHYSICAL EXAMINATION:   GENERAL APPEARANCE:  Pleasant man in no overt distress.   HEENT:   MOUTH/THROAT:   Very poor dentition.  His tongue appears normal.  However, there appears to be some fasciculations.   CHEST/RESPIRATORY:  Clear air entry bilaterally.   CARDIOVASCULAR:  CARDIAC:   Heart sounds are normal.   GASTROINTESTINAL:  LIVER/SPLEEN/KIDNEYS:  No liver, no spleen.   ABDOMEN:  No masses.   GENITOURINARY:  BLADDER:   Not distended.  There is no tenderness.  No costovertebral angle tenderness.   CIRCULATION:  EDEMA/VARICOSITIES:  Extremities:  He has gross edema of the right leg, right up to the upper thigh.  Left leg is much better.   NEUROLOGICAL:    CRANIAL NERVES:  No abnormalities.   SENSATION/STRENGTH:  Upper extremities:  No pronator drift.   DEEP TENDON REFLEXES:  There is no Hoffmann's reflex.  A knee jerk was present on the left, absent at the right.  Both ankle jerks were absent, and I thought both plantar responses were upgoing.   BALANCE/GAIT:  He was able to bring himself to a standing position with some difficulty.  Very unsteady on his feet.  What I could see of his gait was not wide-based, but very ataxic.   PSYCHIATRIC:   MENTAL STATUS:   He was able to give a fairly good account of where he had been, the hospital.  He told me he lived in [place].  Son helped him some, though he did not get out of the house much.     ASSESSMENT/PLAN:  Subacute encephalopathy combined with severe gait ataxia.  This very well could be B12 deficiency with subacute combined degeneration of the cord.  I prefer to replace patients like this more aggressively and I may increase the dose.   Hemoglobin and reticulocyte count will need to be followed.  Classic medicine, multiple choice, hypokalemia will need to be monitored.  He has an appointment with Hematology and I am uncertain about whether there was another issue they wanted to track down.  Large bladder stone with pelvic lymphadenopathy.  He needs the urologic work-up to include a repeat cystoscopy, bladder biopsies, and retrograde ureteropyelograms.  He did not seem to have a UTI, at least what was cultured.    Profound vitamin D deficiency.  I  will probably replace this more aggressively, as well.    Vitamin B1 deficiency.    Severe gait ataxia.  Likely secondary to B12 deficiency, perhaps vitamin D deficiency.  He also has some flattening of the cord at C3-C4.  He has upgoing toes bilaterally, I think, although I would like to have another look at this.    Extensive right leg DVT with an IVC filter.  When Dr. Retta Dionesahlstedt is finished with his work-up, he will need anticoagulation initiated, ultimately the filter removed, for a full six months of anticoagulation.    I think the patient's mental status has already improved, at least from what I can gather.    CPT CODE: [id number]

## 2014-08-31 DIAGNOSIS — Z79899 Other long term (current) drug therapy: Secondary | ICD-10-CM | POA: Diagnosis not present

## 2014-08-31 DIAGNOSIS — D649 Anemia, unspecified: Secondary | ICD-10-CM | POA: Diagnosis not present

## 2014-09-03 ENCOUNTER — Non-Acute Institutional Stay (SKILLED_NURSING_FACILITY): Payer: Medicare Other | Admitting: Internal Medicine

## 2014-09-03 DIAGNOSIS — G320 Subacute combined degeneration of spinal cord in diseases classified elsewhere: Secondary | ICD-10-CM | POA: Diagnosis not present

## 2014-09-03 DIAGNOSIS — R269 Unspecified abnormalities of gait and mobility: Secondary | ICD-10-CM | POA: Diagnosis not present

## 2014-09-03 DIAGNOSIS — E538 Deficiency of other specified B group vitamins: Secondary | ICD-10-CM | POA: Diagnosis not present

## 2014-09-05 ENCOUNTER — Ambulatory Visit (HOSPITAL_COMMUNITY): Payer: Medicare Other

## 2014-09-05 NOTE — Progress Notes (Signed)
Patient ID: Edwin [name], male   DOB: 10/11/1951, 63 y.o.   MRN: [id number]               PROGRESS NOTE  DATE:  09/03/2014    [facility]    LEVEL OF CARE:   SNF   Acute Visit   CHIEF COMPLAINT:  Follow up hemoglobin, B12 deficiency, vitamin D deficiency, etc.    HISTORY OF PRESENT ILLNESS:  This is a patient whom I admitted to the building a week ago.  He was found in an extreme state of disrepair with encephalopathy, failure to thrive, covered in urine and feces at home.    He presented with a hemoglobin of 9, an MCV of 114.8.  His B12 level was less than 10.  His methylmalonic acid level was over 9,800.  He was also discovered to be profoundly vitamin D deficient.  Finally, he had an extensive right leg DVT.   However, when he was anticoagulated, he developed gross hematuria.  His cystoscopy in the hospital showed a bladder stone.  It was recommended to have a repeat cystoscopy, retrograde ureteral pyelograms, and possible lithotripsy.  He is supposed to follow up with Dr. Retta Dionesahlstedt of Urology.  Because he could not be anticoagulated, he had an IVC filter placed.    LABORATORY DATA:  I am looking at his follow-up lab work today which shows his hemoglobin at 9.9.  His white count is 8, platelet count at 389,000.  His MCV is down to 105.  Differential count is normal.     PHYSICAL EXAMINATION:   GENERAL APPEARANCE:  The patient is pleasant and cooperative.   CHEST/RESPIRATORY:  Clear air entry bilaterally.    CARDIOVASCULAR:  CARDIAC:   Heart sounds are normal.  He appears to be euvolemic.   EDEMA/VARICOSITIES:  Extremities:  He still has gross edema of the right leg, but no tenderness.   NEUROLOGICAL:    BALANCE/GAIT:  I did not attempt to ambulate him, but I would like to see him next week.    ASSESSMENT/PLAN:    Severe gait ataxia.  If this is secondary to B12 deficiency (subacute combined degeneration of a cord), this could take months to recover and he may not  recover fully.  He will need extensive and prolonged therapy.  Macrocytic anemia.  His hemoglobin is up from his hospitalization.  His reticulocyte count is also up at 3.1%.  Potassium is normal at 3.9.  This is stable from when he left the hospital.    I am going to continue to follow this patient's hemoglobin weekly.  Everything appears to be stable here.  I would like to see him on his feet next week.  He is working with physical therapy here.

## 2014-09-06 DIAGNOSIS — Z79899 Other long term (current) drug therapy: Secondary | ICD-10-CM | POA: Diagnosis not present

## 2014-09-06 DIAGNOSIS — D649 Anemia, unspecified: Secondary | ICD-10-CM | POA: Diagnosis not present

## 2014-09-12 ENCOUNTER — Encounter (HOSPITAL_COMMUNITY): Payer: Medicare Other | Attending: Hematology and Oncology

## 2014-09-12 ENCOUNTER — Encounter (HOSPITAL_COMMUNITY): Payer: Self-pay

## 2014-09-12 VITALS — BP 162/92 | HR 69 | Temp 98.8°F | Resp 18 | Wt 162.1 lb

## 2014-09-12 DIAGNOSIS — D61818 Other pancytopenia: Secondary | ICD-10-CM

## 2014-09-12 DIAGNOSIS — R591 Generalized enlarged lymph nodes: Secondary | ICD-10-CM

## 2014-09-12 DIAGNOSIS — D510 Vitamin B12 deficiency anemia due to intrinsic factor deficiency: Secondary | ICD-10-CM | POA: Diagnosis not present

## 2014-09-12 DIAGNOSIS — I82411 Acute embolism and thrombosis of right femoral vein: Secondary | ICD-10-CM | POA: Insufficient documentation

## 2014-09-12 DIAGNOSIS — N210 Calculus in bladder: Secondary | ICD-10-CM

## 2014-09-12 LAB — CBC WITH DIFFERENTIAL/PLATELET
Basophils Absolute: 0.1 10*3/uL (ref 0.0–0.1)
Basophils Relative: 1 % (ref 0–1)
Eosinophils Absolute: 0.5 10*3/uL (ref 0.0–0.7)
Eosinophils Relative: 7 % — ABNORMAL HIGH (ref 0–5)
HCT: 37.2 % — ABNORMAL LOW (ref 39.0–52.0)
HEMOGLOBIN: 12.2 g/dL — AB (ref 13.0–17.0)
LYMPHS ABS: 1.9 10*3/uL (ref 0.7–4.0)
LYMPHS PCT: 29 % (ref 12–46)
MCH: 33.1 pg (ref 26.0–34.0)
MCHC: 32.8 g/dL (ref 30.0–36.0)
MCV: 100.8 fL — ABNORMAL HIGH (ref 78.0–100.0)
Monocytes Absolute: 0.2 10*3/uL (ref 0.1–1.0)
Monocytes Relative: 4 % (ref 3–12)
NEUTROS ABS: 3.9 10*3/uL (ref 1.7–7.7)
NEUTROS PCT: 59 % (ref 43–77)
PLATELETS: 317 10*3/uL (ref 150–400)
RBC: 3.69 MIL/uL — AB (ref 4.22–5.81)
RDW: 16.3 % — ABNORMAL HIGH (ref 11.5–15.5)
WBC: 6.5 10*3/uL (ref 4.0–10.5)

## 2014-09-12 LAB — D-DIMER, QUANTITATIVE (NOT AT ARMC): D DIMER QUANT: 1.16 ug{FEU}/mL — AB (ref 0.00–0.48)

## 2014-09-12 NOTE — Progress Notes (Signed)
Edwin [name] presented for labwork. Labs per MD order drawn via Peripheral Line 25 gauge needle inserted in lt ac.  Good blood return present. Procedure without incident.  Needle removed intact. Patient tolerated procedure well.

## 2014-09-12 NOTE — Progress Notes (Signed)
[facility]  OFFICE PROGRESS NOTE  No primary care provider on file. No primary provider on file.  DIAGNOSIS: Thrombosis of right femoral vein - Plan: CBC with Differential, D-dimer, quantitative, CBC with Differential, D-dimer, quantitative  Pancytopenia  Pernicious anemia  Bladder stones  Lymphadenopathy, pelvic  Chief Complaint  Patient presents with  . Follow-up  . nonocclusive femoral vein thrombosis  . Pernicious Anemia    CURRENT THERAPY: IVC filter for femoral vein thrombosis due to hematuria while on eEquis.  INTERVAL HISTORY: Edwin [name] 63 y.o. male returns for follow-up of femoral vein thrombosis, status post IVC filter due to the development of hematuria while taking Eliquis during hospitalization at the end of October 2015.the thrombosis was nonocclusive. He was found to have a B-12 deficiency in the setting of pancytopenia. VQ scan was negative for pulmonary embolism. Protein C activity and total were both low with elevation in anti-cardiolipin antibodies, lupus anticoagulant,and homocystine levelwith no expression of factor V Leiden mutation or prothrombin gene mutation. He is doing well at a local nursing home. Appetite is good with no nausea, vomiting, with improvement in right lower 70 swelling and pain. He does admit to peripheral paresthesias which she has had for years but no dysphagia, diarrhea, constipation, cough, wheezing, chest pain, PND, orthopnea, palpitations, skin rash, headache, or seizures. He has had no further hematuria.  MEDICAL HISTORY:History reviewed. No pertinent past medical history.  INTERIM HISTORY: has Acute encephalopathy; Adult failure to thrive; UTI (lower urinary tract infection); Pancytopenia; Severe protein-calorie malnutrition; Vitamin B12 deficiency; Right leg DVT; Megaloblastic anemia due to B12 deficiency; Vitamin D deficiency; Malnutrition of moderate degree; and Hematuria on his  problem list.   Discharge Diagnoses: 08/22/2014 1. Subacute encephalopathy, multifactorial 2. Right lower extremity DVT 3. Pancytopenia 4. Gross hematuria 5. Acute blood loss anemia superimposed on macrocytic anemia, chronic 6. Suspected UTI 7. Large bladder calculus 8. Bladder diverticula 9. Moderate malnutrition 10.  ALLERGIES:  has No Known Allergies.  MEDICATIONS: has a current medication list which includes the following prescription(s): acetaminophen, albuterol sulfate, aspirin, atorvastatin, calcium-vitamin d, clonazepam, docusate sodium, feeding supplement (ensure complete), ferrous sulfate, gabapentin, hydrocodone-acetaminophen, omega-3 acid ethyl esters, pantoprazole, polyethylene glycol, prenatal multivitamin, vitamin d (cholecalciferol), ciprofloxacin, cyanocobalamin, folic acid, and thiamine.  SURGICAL HISTORY:  Past Surgical History  Procedure Laterality Date  . Cystoscopy N/A 08/21/2014    Procedure: CYSTOSCOPY FLEXIBLE;  Surgeon: Chelsea Aus, MD;  Location: [facility];  Service: Urology;  Laterality: N/A;  at bedside    FAMILY HISTORY: family history is not on file.  SOCIAL HISTORY:  reports that he has never smoked. He uses smokeless tobacco. He reports that he does not drink alcohol.  REVIEW OF SYSTEMS:  Other than that discussed above is noncontributory.  PHYSICAL EXAMINATION: ECOG PERFORMANCE STATUS: 1 - Symptomatic but completely ambulatory  Blood pressure 162/92, pulse 69, temperature 98.8 F (37.1 C), temperature source Oral, resp. rate 18, weight 162 lb 1.6 oz (73.528 kg), SpO2 100 %.  GENERAL:alert, no distress and comfortable SKIN: skin color, texture, turgor are normal, no rashes or significant lesions EYES: PERLA; Conjunctiva are pink and non-injected, sclera clear SINUSES: No redness or tenderness over maxillary or ethmoid sinuses OROPHARYNX:no exudate, no erythema on lips, buccal mucosa, or tongue. Nearly edentulous.Marland Kitchen NECK: supple, thyroid  normal size, non-tender, without nodularity. No masses CHEST: increased AP diameter with no breast masses. LYMPH:  no palpable lymphadenopathy in the cervical, axillary or inguinal LUNGS: clear  to auscultation and percussion with normal breathing effort HEART: regular rate & rhythm and no murmurs. ABDOMEN:abdomen soft, non-tender and normal bowel sounds MUSCULOSKELETAL:no cyanosis of digits and no clubbing. Range of motion normal.  NEURO: alert & oriented x 3 with fluent speech, no focal motor/sensory deficits   LABORATORY DATA: Office Visit on 09/12/2014  Component Date Value Ref Range Status  . WBC 09/12/2014 6.5  4.0 - 10.5 K/uL Final  . RBC 09/12/2014 3.69* 4.22 - 5.81 MIL/uL Final  . Hemoglobin 09/12/2014 12.2* 13.0 - 17.0 g/dL Final  . HCT 16/07/9603 37.2* 39.0 - 52.0 % Final  . MCV 09/12/2014 100.8* 78.0 - 100.0 fL Final  . MCH 09/12/2014 33.1  26.0 - 34.0 pg Final  . MCHC 09/12/2014 32.8  30.0 - 36.0 g/dL Final  . RDW 54/06/8118 16.3* 11.5 - 15.5 % Final  . Platelets 09/12/2014 317  150 - 400 K/uL Final  . Neutrophils Relative % 09/12/2014 59  43 - 77 % Final  . Neutro Abs 09/12/2014 3.9  1.7 - 7.7 K/uL Final  . Lymphocytes Relative 09/12/2014 29  12 - 46 % Final  . Lymphs Abs 09/12/2014 1.9  0.7 - 4.0 K/uL Final  . Monocytes Relative 09/12/2014 4  3 - 12 % Final  . Monocytes Absolute 09/12/2014 0.2  0.1 - 1.0 K/uL Final  . Eosinophils Relative 09/12/2014 7* 0 - 5 % Final  . Eosinophils Absolute 09/12/2014 0.5  0.0 - 0.7 K/uL Final  . Basophils Relative 09/12/2014 1  0 - 1 % Final  . Basophils Absolute 09/12/2014 0.1  0.0 - 0.1 K/uL Final  . D-Dimer, Quant 09/12/2014 1.16* 0.00 - 0.48 ug/mL-FEU Final   Comment:        AT THE INHOUSE ESTABLISHED CUTOFF VALUE OF 0.48 ug/mL FEU, THIS ASSAY HAS BEEN DOCUMENTED IN THE LITERATURE TO HAVE A SENSITIVITY AND NEGATIVE PREDICTIVE VALUE OF AT LEAST 98 TO 99%.  THE TEST RESULT SHOULD BE CORRELATED WITH AN ASSESSMENT OF THE  CLINICAL PROBABILITY OF DVT / VTE.   Admission on 08/13/2014, Discharged on 08/22/2014  No results displayed because visit has over 200 results.      PATHOLOGY:no new pathology.  Urinalysis    Component Value Date/Time   COLORURINE RED* 08/16/2014 1715   APPEARANCEUR HAZY* 08/16/2014 1715   LABSPEC <1.005* 08/16/2014 1715   PHURINE 8.0 08/16/2014 1715   GLUCOSEU >1000* 08/16/2014 1715   HGBUR LARGE* 08/16/2014 1715   BILIRUBINUR LARGE* 08/16/2014 1715   KETONESUR >80* 08/16/2014 1715   PROTEINUR >300* 08/16/2014 1715   UROBILINOGEN >8.0* 08/16/2014 1715   NITRITE POSITIVE* 08/16/2014 1715   LEUKOCYTESUR LARGE* 08/16/2014 1715    RADIOGRAPHIC STUDIES: Ct Abdomen Pelvis Wo Contrast  08/14/2014   CLINICAL DATA:  Abdominal pain, generalized. Chest pain. Incontinence for 2 months.  EXAM: CT CHEST, ABDOMEN AND PELVIS WITHOUT CONTRAST  TECHNIQUE: Multidetector CT imaging of the chest, abdomen and pelvis was performed following the standard protocol without IV contrast.  COMPARISON:  None.  FINDINGS: CT CHEST FINDINGS  Normal heart size. Normal caliber thoracic aorta. Esophagus is decompressed. Small esophageal hiatal hernia. No significant lymphadenopathy in the chest.  Focal wedge-shaped areas of atelectasis or scarring in the right middle and lower lungs. No airspace disease in the lungs. No pneumothorax. No pleural effusions.  CT ABDOMEN AND PELVIS FINDINGS  Cholelithiasis without additional inflammatory changes. Diffuse fatty infiltration of the pancreas. The unenhanced appearance of the liver, spleen, adrenal glands, kidneys, abdominal aorta, inferior vena cava, and  retroperitoneal lymph nodes is unremarkable. Stomach, small bowel, and colon are decompressed. No free fluid or free air in the abdomen.  Pelvis: The bladder wall is diffusely thickened and there is infiltration in the fat around the bladder. Multiple bladder diverticula, including a large diverticulum arising from the dome of  the bladder. Increased density in the posterior bladder and extending into a diverticulum, likely representing a bladder stone. Findings suggest cystitis, possibly associated with chronic bladder outlet obstruction. Enlarged lymph nodes are present adjacent to the bladder. Neoplastic involvement is not excluded. Prostate gland is not significantly enlarged. No free or loculated pelvic fluid collections are demonstrated. Appendix is normal. No evidence of diverticulitis. Degenerative changes in the thoracic and lumbar spine. No destructive bone lesions.  IMPRESSION: Multiple wedge-shaped areas of atelectasis or scarring demonstrated in the right middle and lower lungs.  Thickening of the bladder wall with infiltration in the fat around the bladder. It multiple bladder diverticula. Bladder stones. Findings likely due to cystitis, possibly associated with chronic bladder outlet obstruction. Enlarged lymph nodes in the pelvis. Bladder neoplasm not excluded.   Electronically Signed   By: Burman NievesWilliam  Stevens M.D.   On: 08/14/2014 02:39   Dg Chest 2 View  08/15/2014   CLINICAL DATA:  Deep venous thrombosis.  EXAM: CHEST  2 VIEW  COMPARISON:  August 13, 2014.  FINDINGS: The heart size and mediastinal contours are within normal limits. Both lungs are clear. No pneumothorax is noted. Minimal bilateral pleural effusions are noted posteriorly. The visualized skeletal structures are unremarkable.  IMPRESSION: Minimal bilateral pleural effusions. No other significant abnormality seen in the chest.   Electronically Signed   By: Roque LiasJames  Green M.D.   On: 08/15/2014 10:51   Dg Chest 2 View  08/13/2014   CLINICAL DATA:  Failure to thrive. A initial complaint to EMS was chest pain. Patient has altered mental status.  EXAM: CHEST  2 VIEW  COMPARISON:  None.  FINDINGS: Cardiac silhouette is normal in size. Normal mediastinal and hilar contours.  Clear lungs.  No pleural effusion or pneumothorax.  Bony thorax is intact.  IMPRESSION:  No active cardiopulmonary disease.   Electronically Signed   By: Amie Portlandavid  Ormond M.D.   On: 08/13/2014 22:56   Ct Head Wo Contrast  08/13/2014   CLINICAL DATA:  63 year old male found with altered mental status, dementia like activity. Initial encounter.  EXAM: CT HEAD WITHOUT CONTRAST  TECHNIQUE: Contiguous axial images were obtained from the base of the skull through the vertex without intravenous contrast.  COMPARISON:  None.  FINDINGS: Mild paranasal sinus mucosal thickening. Mastoids are clear. No acute osseous abnormality identified. Visualized orbits and scalp soft tissues are within normal limits.  Cerebral volume is within normal limits for age. No suspicious intracranial vascular hyperdensity. No midline shift, ventriculomegaly, mass effect, evidence of mass lesion, intracranial hemorrhage or evidence of cortically based acute infarction. Gray-white matter differentiation is within normal limits throughout the brain.  IMPRESSION: Normal for age non contrast CT appearance of the brain.   Electronically Signed   By: Augusto GambleLee  Hall M.D.   On: 08/13/2014 23:03   Ct Chest Wo Contrast  08/14/2014   CLINICAL DATA:  Abdominal pain, generalized. Chest pain. Incontinence for 2 months.  EXAM: CT CHEST, ABDOMEN AND PELVIS WITHOUT CONTRAST  TECHNIQUE: Multidetector CT imaging of the chest, abdomen and pelvis was performed following the standard protocol without IV contrast.  COMPARISON:  None.  FINDINGS: CT CHEST FINDINGS  Normal heart size. Normal caliber thoracic  aorta. Esophagus is decompressed. Small esophageal hiatal hernia. No significant lymphadenopathy in the chest.  Focal wedge-shaped areas of atelectasis or scarring in the right middle and lower lungs. No airspace disease in the lungs. No pneumothorax. No pleural effusions.  CT ABDOMEN AND PELVIS FINDINGS  Cholelithiasis without additional inflammatory changes. Diffuse fatty infiltration of the pancreas. The unenhanced appearance of the liver, spleen,  adrenal glands, kidneys, abdominal aorta, inferior vena cava, and retroperitoneal lymph nodes is unremarkable. Stomach, small bowel, and colon are decompressed. No free fluid or free air in the abdomen.  Pelvis: The bladder wall is diffusely thickened and there is infiltration in the fat around the bladder. Multiple bladder diverticula, including a large diverticulum arising from the dome of the bladder. Increased density in the posterior bladder and extending into a diverticulum, likely representing a bladder stone. Findings suggest cystitis, possibly associated with chronic bladder outlet obstruction. Enlarged lymph nodes are present adjacent to the bladder. Neoplastic involvement is not excluded. Prostate gland is not significantly enlarged. No free or loculated pelvic fluid collections are demonstrated. Appendix is normal. No evidence of diverticulitis. Degenerative changes in the thoracic and lumbar spine. No destructive bone lesions.  IMPRESSION: Multiple wedge-shaped areas of atelectasis or scarring demonstrated in the right middle and lower lungs.  Thickening of the bladder wall with infiltration in the fat around the bladder. It multiple bladder diverticula. Bladder stones. Findings likely due to cystitis, possibly associated with chronic bladder outlet obstruction. Enlarged lymph nodes in the pelvis. Bladder neoplasm not excluded.   Electronically Signed   By: Burman NievesWilliam  Stevens M.D.   On: 08/14/2014 02:39   Mr Brain Wo Contrast  08/14/2014   CLINICAL DATA:  63 year old male with encephalopathy, failure to thrive, progressive weakness and confusion for the past 2 months. Subsequent encounter.  EXAM: MRI HEAD WITHOUT CONTRAST  TECHNIQUE: Multiplanar, multiecho pulse sequences of the brain and surrounding structures were obtained without intravenous contrast.  COMPARISON:  08/13/2014 CT.  No comparison MR.  FINDINGS: Exam is motion degraded.  No acute infarct.  No intracranial hemorrhage.  Questionable  subtle T2 increased signal within the uncus/hippocampus and subinsular region bilaterally. With this distribution, infection such as herpes encephalitis cannot be excluded. There are however, no changes on the diffusion sequence as can be seen with herpes encephalitis and patient's symptoms have been going on for 2 months. Clinical and laboratory correlation recommended. Limbic encephalitis can present with a similar appearance. Result of metabolic abnormality (vitamin-B deficiency) is a less likely consideration. Primary brain tumor felt unlikely. If the patient had persistent or progressive symptoms, close followup MR with contrast may be considered.  Patchy and punctate white matter type changes consistent with result of small vessel disease.  No hydrocephalus  No intracranial mass lesion seen from above described findings.  C3-4 disc protrusion with spinal stenosis and mild cord flattening.  Mild paranasal sinus mucosal thickening.  Major intracranial vascular structures are patent.  Small pituitary gland. Pineal region unremarkable. Orbital structures unremarkable.  IMPRESSION: Exam is motion degraded.  No acute infarct.  Questionable subtle T2 increased signal within the uncus/hippocampus and subinsular region bilaterally. With this distribution, infection such as herpes encephalitis cannot be excluded. There are however, no changes on the diffusion sequence as can be seen with herpes encephalitis and patient's symptoms have been going on for 2 months. Clinical and laboratory correlation recommended.  Limbic encephalitis can present with a similar appearance. Result of metabolic abnormality (vitamin-B deficiency) is a less likely consideration. Primary brain tumor  felt unlikely. If the patient had persistent or progressive symptoms, close followup MR with contrast may be considered.  Mild small vessel disease.  C3-4 disc protrusion with spinal stenosis and mild cord flattening.  These results were called by  telephone at the time of interpretation on 08/14/2014 at 9:24 am to Dr. Doree Albee , who verbally acknowledged these results.   Electronically Signed   By: Bridgett Larsson M.D.   On: 08/14/2014 09:37   Ir Ivc Filter Plmt / S&i /img Guid/mod Sed  08/20/2014   CLINICAL DATA:  Right lower extremity DVT. Contraindication to anticoagulation due to gross hematuria.  EXAM: 1. ULTRASOUND GUIDANCE FOR VASCULAR ACCESS OF THE RIGHT INTERNAL JUGULAR VEIN. 2. IVC VENOGRAM. 3. PERCUTANEOUS IVC FILTER PLACEMENT.  ANESTHESIA/SEDATION: 0.5 mg IV Versed; 25 mcg IV Fentanyl.  Total Moderate Sedation Time  .  CONTRAST:  30 mL Omnipaque 300  FLUOROSCOPY TIME:  1 minutes and 12 seconds.  PROCEDURE: The procedure, risks, benefits, and alternatives were explained to the patient and his son. Questions regarding the procedure were encouraged and answered. Informed consent was obtained from the patient's son due to underlying encephalopathy.  The right neck was prepped with Betadine in a sterile fashion, and a sterile drape was applied covering the operative field. A sterile gown and sterile gloves were used for the procedure. A time-out procedure was performed. Local anesthesia was provided with 1% Lidocaine.  Ultrasound was used to confirm patency of the right internal jugular vein. Under direct ultrasound guidance, a 21 gauge needle was advanced into the right internal jugular vein with ultrasound image documentation performed. After securing access with a micropuncture dilator, a guidewire was advanced into the inferior vena cava. A deployment sheath was advanced over the guidewire. This was utilized to perform IVC venography.  The deployment sheath was further positioned in an appropriate location for filter deployment. A Bard Denali IVC filter was then advanced in the sheath. This was then fully deployed in the infrarenal IVC. Final filter position was confirmed with a fluoroscopic spot image. Contrast injection was also  performed through the sheath under fluoroscopy to confirm patency of the IVC at the level of the filter. After the procedure the sheath was removed and hemostasis obtained with manual compression.  COMPLICATIONS: None.  FINDINGS: IVC venography demonstrates a normal caliber IVC with no evidence of thrombus. Renal veins are identified bilaterally. The IVC filter was successfully positioned below the level of the renal veins and is appropriately oriented. This IVC filter has both permanent and retrievable indications.  IMPRESSION: Placement of percutaneous IVC filter in infrarenal IVC. IVC venogram shows no evidence of IVC thrombus and normal caliber of the inferior vena cava. This filter does have both permanent and retrievable indications.   Electronically Signed   By: Irish Lack M.D.   On: 08/20/2014 16:30   Nm Pulmonary Perf And Vent  08/15/2014   CLINICAL DATA:  Altered mental status. No current shortness of breath or chest pain.  EXAM: NUCLEAR MEDICINE VENTILATION - PERFUSION LUNG SCAN  TECHNIQUE: Ventilation images were obtained in multiple projections using inhaled aerosol technetium 99 M DTPA. Perfusion images were obtained in multiple projections after intravenous injection of Tc-56m MAA.  RADIOPHARMACEUTICALS:  40.0 mCi Tc-71m DTPA aerosol and 6.0 mCi Tc-62m MAA  COMPARISON:  Chest radiograph, 08/15/2014.  FINDINGS: Ventilation: No focal ventilation defect.  Perfusion: No wedge shaped peripheral perfusion defects to suggest acute pulmonary embolism.  IMPRESSION: No evidence of a pulmonary embolism.   Electronically  Signed   By: Amie Portland M.D.   On: 08/15/2014 11:45   US Venous Img Lower Unilateral Right  08/14/2014   CLINICAL DATA:  Right lower extremity pain and edema. Evaluate for DVT.  EXAM: RIGHT LOWER EXTREMITY VENOUS DOPPLER ULTRASOUND  TECHNIQUE: Gray-scale sonography with graded compression, as well as color Doppler and duplex ultrasound were performed to evaluate the lower  extremity deep venous systems from the level of the common femoral vein and including the common femoral, femoral, profunda femoral, popliteal and calf veins including the posterior tibial, peroneal and gastrocnemius veins when visible. The superficial great saphenous vein was also interrogated. Spectral Doppler was utilized to evaluate flow at rest and with distal augmentation maneuvers in the common femoral, femoral and popliteal veins.  COMPARISON:  None.  FINDINGS: There is a minimal amount of eccentric mixed echogenic nonocclusive thrombus within right common femoral vein (images 2 and 4). Nonocclusive thrombus extends to involve the imaged portions of the proximal (images 13 and 14), mid (images 16 and 17) and distal (images 21 and 22) aspects of the right superficial femoral vein.  The right deep femoral and popliteal veins are widely patent. The greater saphenous vein appears widely patent throughout its imaged course.  Incidental note is made of a serpiginous approximately 2.8 x 0.7 cm fluid collection within the right popliteal fossa favored to represent Baker's cyst.  There is subcutaneous edema at the level of the calf. Note is made of two benign appearing right inguinal lymph nodes which are not enlarged by size criteria and maintain benign fatty hila.  IMPRESSION: 1. Examination is positive for nonocclusive thrombus extending from the level of the right common femoral vein to the distal aspect of the right superficial femoral vein. 2. Incidental note made of an approximately 2.8 cm right-sided Baker cyst. These results will be called to the ordering clinician or representative by the Radiologist Assistant, and communication documented in the PACS or zVision Dashboard.   Electronically Signed   By: Simonne Come M.D.   On: 08/14/2014 17:15    ASSESSMENT:  #1. Pancytopenia secondary to pernicious anemia (vitamin12 deficiency), resolved after vitamin B-12 supplementation. #2. Nonocclusive thrombosis  right femoral vein, improved on no anticoagulation, status post IVC filter, with coagulation abnormalities most likely due to the acute thrombus at the time. #3. Bladder stones with hematuria, for urologic follow-up, no longer actively bleeding. #4. Limbic encephalitis.    PLAN:  #1. Vitamin B-12 1000 g intramuscularly monthly. #2. Follow-up in 3 months with CBC.  #3. Urology follow-up regarding bladder stones. Hold off anticoagulation until the stones are removed or the patient undergoes lithotripsy.   All questions were answered. The patient knows to call the clinic with any problems, questions or concerns. We can certainly see the patient much sooner if necessary.   I spent 25 minutes counseling the patient face to face. The total time spent in the appointment was 30 minutes.    Maurilio Lovely, MD 09/12/2014 3:27 PM  DISCLAIMER:  This note was dictated with voice recognition software.  Similar sounding words can inadvertently be transcribed inaccurately and may not be corrected upon review.

## 2014-09-12 NOTE — Patient Instructions (Signed)
..  [facility] Discharge Instructions  RECOMMENDATIONS MADE BY THE CONSULTANT AND ANY TEST RESULTS WILL BE SENT TO YOUR REFERRING PHYSICIAN.  EXAM FINDINGS BY THE PHYSICIAN TODAY AND SIGNS OR SYMPTOMS TO REPORT TO CLINIC OR PRIMARY PHYSICIAN: Exam and findings as discussed by Improved right leg swelling  Follow-up with urology.  MEDICATIONS PRESCRIBED:  B12 injection monthly  INSTRUCTIONS/FOLLOW-UP: 3 months with cbc  Thank you for choosing [facility] to provide your oncology and hematology care.  To afford each patient quality time with our providers, please arrive at least 15 minutes before your scheduled appointment time.  With your help, our goal is to use those 15 minutes to complete the necessary work-up to ensure our physicians have the information they need to help with your evaluation and healthcare recommendations.    Effective January 1st, 2014, we ask that you re-schedule your appointment with our physicians should you arrive 10 or more minutes late for your appointment.  We strive to give you quality time with our providers, and arriving late affects you and other patients whose appointments are after yours.    Again, thank you for choosing [facility].  Our hope is that these requests will decrease the amount of time that you wait before being seen by our physicians.       _____________________________________________________________  Should you have questions after your visit to [facility], please contact our office at [phone] between the hours of 8:30 a.m. and 4:30 p.m.  Voicemails left after 4:30 p.m. will not be returned until the following business day.  For prescription refill requests, have your pharmacy contact our office with your prescription refill request.    _______________________________________________________________  We hope that we have given you very good care.  You may receive a patient  satisfaction survey in the mail, please complete it and return it as soon as possible.  We value your feedback!  _______________________________________________________________  Have you asked about our STAR program?  STAR stands for Survivorship Training and Rehabilitation, and this is a nationally recognized cancer care program that focuses on survivorship and rehabilitation.  Cancer and cancer treatments may cause problems, such as, pain, making you feel tired and keeping you from doing the things that you need or want to do. Cancer rehabilitation can help. Our goal is to reduce these troubling effects and help you have the best quality of life possible.  You may receive a survey from a nurse that asks questions about your current state of health.  Based on the survey results, all eligible patients will be referred to the [facility]TAR program for an evaluation so we can better serve you!  A frequently asked questions sheet is available upon request.

## 2014-09-15 ENCOUNTER — Non-Acute Institutional Stay (SKILLED_NURSING_FACILITY): Payer: Medicare Other | Admitting: Internal Medicine

## 2014-09-15 DIAGNOSIS — N210 Calculus in bladder: Secondary | ICD-10-CM

## 2014-09-15 DIAGNOSIS — E538 Deficiency of other specified B group vitamins: Secondary | ICD-10-CM

## 2014-09-15 DIAGNOSIS — G320 Subacute combined degeneration of spinal cord in diseases classified elsewhere: Secondary | ICD-10-CM | POA: Diagnosis not present

## 2014-09-15 DIAGNOSIS — R269 Unspecified abnormalities of gait and mobility: Secondary | ICD-10-CM | POA: Diagnosis not present

## 2014-09-17 DIAGNOSIS — N390 Urinary tract infection, site not specified: Secondary | ICD-10-CM | POA: Diagnosis not present

## 2014-09-18 NOTE — Progress Notes (Addendum)
Patient ID: Edwin [name], male   DOB: 08/13/1951, 63 y.o.   MRN: [id number]               PROGRESS NOTE  DATE:  09/15/2014     [facility]AC[facility]    LEVEL OF CARE:   SNF   Acute Visit   CHIEF COMPLAINT:  Follow up medical issues.    HISTORY OF PRESENT ILLNESS:  This is a patient who came to us with a profound vitamin B12 deficiency.    He also had a right lower extremity DVT.  Because of hematuria, he was not anticoagulated.  He had an IVC filter placed.    I believe he is following with Urology for a large bladder calculus.    After his arrival here, I replaced his B12 more aggressively.  Most recent hemoglobin on 09/06/2014 was up to 11.5 with a slightly elevated reticulocyte count of 2.7.  His potassium is now stable at 4.1.    He is still very unsteady on his feet.  In fact, he is somewhat orthostatic.  I will try to check this.  Any degree of pancytopenia he had has resolved.    PHYSICAL EXAMINATION:   GENERAL APPEARANCE:  The patient is much more conversational.   CHEST/RESPIRATORY:  Clear air entry bilaterally.   CARDIOVASCULAR:  CARDIAC:   Heart sounds are normal.  He is not dehydrated.   NEUROLOGICAL:    BALANCE/GAIT:  He is able to bring himself to a standing position.  However, he is very unsteady but not wide-based.  As I noted, he will probably need to be checked for orthostatic hypotension.     ASSESSMENT/PLAN:    Profound vitamin B12 deficiency.  ?Subacute combined degeneration of the cord.  His B12 has been replaced.  His hemoglobin is responding briskly.  Unfortunately, neurologic recovery here can be very protracted, if at all.  He will also need to have his orthostatic BP checked. I am not certain at this point if this is true orthostasis vs just part of his extreme gait issues. He will need a PROTRACTED COURSE OF PT AND OT which refects the underlying neurologic disease and its potential for slow recovery.   Bladder calculi with hematuria.  He was  supposed to follow up with Dr. Retta Dionesahlstedt.  I will need to make sure that we have arranged this appointment.

## 2014-09-25 DIAGNOSIS — Z8619 Personal history of other infectious and parasitic diseases: Secondary | ICD-10-CM

## 2014-09-25 HISTORY — DX: Personal history of other infectious and parasitic diseases: Z86.19

## 2014-09-27 DIAGNOSIS — D649 Anemia, unspecified: Secondary | ICD-10-CM | POA: Diagnosis not present

## 2014-10-05 ENCOUNTER — Emergency Department (HOSPITAL_COMMUNITY): Payer: Commercial Managed Care - HMO

## 2014-10-05 ENCOUNTER — Inpatient Hospital Stay (HOSPITAL_COMMUNITY)
Admission: EM | Admit: 2014-10-05 | Discharge: 2014-10-11 | DRG: 872 | Disposition: A | Discharge status: Skilled Nursing Facility | Payer: Commercial Managed Care - HMO | Attending: Internal Medicine | Admitting: Internal Medicine

## 2014-10-05 ENCOUNTER — Encounter (HOSPITAL_COMMUNITY): Payer: Self-pay | Admitting: Emergency Medicine

## 2014-10-05 DIAGNOSIS — R279 Unspecified lack of coordination: Secondary | ICD-10-CM | POA: Diagnosis not present

## 2014-10-05 DIAGNOSIS — N210 Calculus in bladder: Secondary | ICD-10-CM | POA: Diagnosis not present

## 2014-10-05 DIAGNOSIS — Z87440 Personal history of urinary (tract) infections: Secondary | ICD-10-CM | POA: Diagnosis not present

## 2014-10-05 DIAGNOSIS — N3001 Acute cystitis with hematuria: Secondary | ICD-10-CM | POA: Diagnosis not present

## 2014-10-05 DIAGNOSIS — Z7982 Long term (current) use of aspirin: Secondary | ICD-10-CM | POA: Diagnosis not present

## 2014-10-05 DIAGNOSIS — E538 Deficiency of other specified B group vitamins: Secondary | ICD-10-CM | POA: Diagnosis present

## 2014-10-05 DIAGNOSIS — D531 Other megaloblastic anemias, not elsewhere classified: Secondary | ICD-10-CM | POA: Diagnosis present

## 2014-10-05 DIAGNOSIS — R509 Fever, unspecified: Secondary | ICD-10-CM | POA: Diagnosis present

## 2014-10-05 DIAGNOSIS — Z792 Long term (current) use of antibiotics: Secondary | ICD-10-CM | POA: Diagnosis not present

## 2014-10-05 DIAGNOSIS — R262 Difficulty in walking, not elsewhere classified: Secondary | ICD-10-CM | POA: Diagnosis not present

## 2014-10-05 DIAGNOSIS — B379 Candidiasis, unspecified: Secondary | ICD-10-CM | POA: Diagnosis present

## 2014-10-05 DIAGNOSIS — Z95828 Presence of other vascular implants and grafts: Secondary | ICD-10-CM

## 2014-10-05 DIAGNOSIS — N390 Urinary tract infection, site not specified: Secondary | ICD-10-CM | POA: Diagnosis present

## 2014-10-05 DIAGNOSIS — Z86718 Personal history of other venous thrombosis and embolism: Secondary | ICD-10-CM | POA: Diagnosis not present

## 2014-10-05 DIAGNOSIS — G9340 Encephalopathy, unspecified: Secondary | ICD-10-CM | POA: Insufficient documentation

## 2014-10-05 DIAGNOSIS — R05 Cough: Secondary | ICD-10-CM

## 2014-10-05 DIAGNOSIS — I82401 Acute embolism and thrombosis of unspecified deep veins of right lower extremity: Secondary | ICD-10-CM | POA: Diagnosis present

## 2014-10-05 DIAGNOSIS — M6281 Muscle weakness (generalized): Secondary | ICD-10-CM | POA: Diagnosis not present

## 2014-10-05 DIAGNOSIS — Z9889 Other specified postprocedural states: Secondary | ICD-10-CM

## 2014-10-05 DIAGNOSIS — R1319 Other dysphagia: Secondary | ICD-10-CM | POA: Diagnosis not present

## 2014-10-05 DIAGNOSIS — E440 Moderate protein-calorie malnutrition: Secondary | ICD-10-CM | POA: Diagnosis present

## 2014-10-05 DIAGNOSIS — A419 Sepsis, unspecified organism: Secondary | ICD-10-CM

## 2014-10-05 DIAGNOSIS — Z79899 Other long term (current) drug therapy: Secondary | ICD-10-CM | POA: Diagnosis not present

## 2014-10-05 DIAGNOSIS — E46 Unspecified protein-calorie malnutrition: Secondary | ICD-10-CM | POA: Diagnosis not present

## 2014-10-05 DIAGNOSIS — R059 Cough, unspecified: Secondary | ICD-10-CM

## 2014-10-05 DIAGNOSIS — N323 Diverticulum of bladder: Secondary | ICD-10-CM | POA: Diagnosis not present

## 2014-10-05 DIAGNOSIS — R278 Other lack of coordination: Secondary | ICD-10-CM | POA: Diagnosis not present

## 2014-10-05 DIAGNOSIS — D538 Other specified nutritional anemias: Secondary | ICD-10-CM | POA: Diagnosis not present

## 2014-10-05 DIAGNOSIS — I82411 Acute embolism and thrombosis of right femoral vein: Secondary | ICD-10-CM | POA: Diagnosis not present

## 2014-10-05 DIAGNOSIS — R41841 Cognitive communication deficit: Secondary | ICD-10-CM | POA: Diagnosis not present

## 2014-10-05 HISTORY — DX: Disorder of kidney and ureter, unspecified: N28.9

## 2014-10-05 HISTORY — DX: Presence of other vascular implants and grafts: Z95.828

## 2014-10-05 HISTORY — DX: Anemia, unspecified: D64.9

## 2014-10-05 HISTORY — DX: Urinary tract infection, site not specified: N39.0

## 2014-10-05 HISTORY — DX: Sepsis, unspecified organism: A41.9

## 2014-10-05 HISTORY — DX: Encephalopathy, unspecified: G93.40

## 2014-10-05 LAB — CBC WITH DIFFERENTIAL/PLATELET
BASOS ABS: 0 10*3/uL (ref 0.0–0.1)
BASOS PCT: 0 % (ref 0–1)
Eosinophils Absolute: 0 10*3/uL (ref 0.0–0.7)
Eosinophils Relative: 0 % (ref 0–5)
HEMATOCRIT: 39.4 % (ref 39.0–52.0)
Hemoglobin: 12.9 g/dL — ABNORMAL LOW (ref 13.0–17.0)
LYMPHS PCT: 9 % — AB (ref 12–46)
Lymphs Abs: 1.3 10*3/uL (ref 0.7–4.0)
MCH: 30.5 pg (ref 26.0–34.0)
MCHC: 32.7 g/dL (ref 30.0–36.0)
MCV: 93.1 fL (ref 78.0–100.0)
Monocytes Absolute: 0.7 10*3/uL (ref 0.1–1.0)
Monocytes Relative: 5 % (ref 3–12)
NEUTROS ABS: 13.2 10*3/uL — AB (ref 1.7–7.7)
NEUTROS PCT: 86 % — AB (ref 43–77)
Platelets: 348 10*3/uL (ref 150–400)
RBC: 4.23 MIL/uL (ref 4.22–5.81)
RDW: 15.7 % — AB (ref 11.5–15.5)
WBC: 15.3 10*3/uL — AB (ref 4.0–10.5)

## 2014-10-05 LAB — URINALYSIS, ROUTINE W REFLEX MICROSCOPIC
Bilirubin Urine: NEGATIVE
Glucose, UA: NEGATIVE mg/dL
KETONES UR: NEGATIVE mg/dL
NITRITE: NEGATIVE
PH: 7 (ref 5.0–8.0)
Protein, ur: 100 mg/dL — AB
SPECIFIC GRAVITY, URINE: 1.02 (ref 1.005–1.030)
Urobilinogen, UA: 0.2 mg/dL (ref 0.0–1.0)

## 2014-10-05 LAB — URINE MICROSCOPIC-ADD ON

## 2014-10-05 LAB — COMPREHENSIVE METABOLIC PANEL
ALBUMIN: 3.8 g/dL (ref 3.5–5.2)
ALT: 29 U/L (ref 0–53)
AST: 26 U/L (ref 0–37)
Alkaline Phosphatase: 106 U/L (ref 39–117)
Anion gap: 18 — ABNORMAL HIGH (ref 5–15)
BILIRUBIN TOTAL: 0.8 mg/dL (ref 0.3–1.2)
BUN: 24 mg/dL — ABNORMAL HIGH (ref 6–23)
CHLORIDE: 95 meq/L — AB (ref 96–112)
CO2: 23 meq/L (ref 19–32)
Calcium: 9.8 mg/dL (ref 8.4–10.5)
Creatinine, Ser: 0.86 mg/dL (ref 0.50–1.35)
GFR calc Af Amer: >90 mL/min (ref >90)
GFR calc non Af Amer: 89 mL/min — ABNORMAL LOW (ref >90)
Glucose, Bld: 145 mg/dL — ABNORMAL HIGH (ref 70–99)
POTASSIUM: 4 meq/L (ref 3.7–5.3)
Sodium: 136 mEq/L — ABNORMAL LOW (ref 137–147)
Total Protein: 8.1 g/dL (ref 6.0–8.3)

## 2014-10-05 LAB — TROPONIN I

## 2014-10-05 LAB — LACTIC ACID, PLASMA: Lactic Acid, Venous: 1.7 mmol/L (ref 0.5–2.2)

## 2014-10-05 MED ORDER — HYDROCODONE-ACETAMINOPHEN 5-325 MG PO TABS
1.0000 | ORAL_TABLET | Freq: Four times a day (QID) | ORAL | Status: DC | PRN
  Filled 2014-10-05: qty 1

## 2014-10-05 MED ORDER — ACETAMINOPHEN 650 MG RE SUPP: 650.0000 mg | Freq: Four times a day (QID) | RECTAL | Status: DC | PRN

## 2014-10-05 MED ORDER — SODIUM CHLORIDE 0.9 % IJ SOLN
3.0000 mL | Freq: Two times a day (BID) | INTRAMUSCULAR | Status: DC
  Administered 2014-10-06 – 2014-10-09 (×4): 3 mL via INTRAVENOUS

## 2014-10-05 MED ORDER — VANCOMYCIN HCL IN DEXTROSE 1-5 GM/200ML-% IV SOLN
1000.0000 mg | Freq: Two times a day (BID) | INTRAVENOUS | Status: DC
  Filled 2014-10-05 (×5): qty 200

## 2014-10-05 MED ORDER — ENOXAPARIN SODIUM 40 MG/0.4ML ~~LOC~~ SOLN
40.0000 mg | SUBCUTANEOUS | Status: DC
Start: 2014-10-05 — End: 2014-10-11
  Administered 2014-10-06 – 2014-10-10 (×6): 40 mg via SUBCUTANEOUS
  Filled 2014-10-05 (×6): qty 0.4

## 2014-10-05 MED ORDER — CLONAZEPAM 0.5 MG PO TABS
0.5000 mg | ORAL_TABLET | Freq: Every day | ORAL | Status: DC
  Administered 2014-10-06 – 2014-10-10 (×6): 0.5 mg via ORAL
  Filled 2014-10-05 (×6): qty 1

## 2014-10-05 MED ORDER — ONDANSETRON HCL 4 MG PO TABS: 4.0000 mg | ORAL_TABLET | Freq: Four times a day (QID) | ORAL | Status: DC | PRN

## 2014-10-05 MED ORDER — SODIUM CHLORIDE 0.9 % IV BOLUS (SEPSIS)
500.0000 mL | INTRAVENOUS | Status: AC
  Administered 2014-10-05: 500 mL via INTRAVENOUS

## 2014-10-05 MED ORDER — ONDANSETRON HCL 4 MG/2ML IJ SOLN: 4.0000 mg | Freq: Four times a day (QID) | INTRAMUSCULAR | Status: DC | PRN

## 2014-10-05 MED ORDER — VANCOMYCIN HCL IN DEXTROSE 1-5 GM/200ML-% IV SOLN
1000.0000 mg | Freq: Once | INTRAVENOUS | Status: AC
  Administered 2014-10-05: 1000 mg via INTRAVENOUS
  Filled 2014-10-05: qty 200

## 2014-10-05 MED ORDER — GABAPENTIN 100 MG PO CAPS
100.0000 mg | ORAL_CAPSULE | Freq: Three times a day (TID) | ORAL | Status: DC
  Administered 2014-10-06 – 2014-10-11 (×17): 100 mg via ORAL
  Filled 2014-10-05 (×17): qty 1

## 2014-10-05 MED ORDER — SODIUM CHLORIDE 0.9 % IV SOLN: 250.0000 mL | INTRAVENOUS | Status: DC | PRN

## 2014-10-05 MED ORDER — SODIUM CHLORIDE 0.9 % IJ SOLN
3.0000 mL | Freq: Two times a day (BID) | INTRAMUSCULAR | Status: DC
  Administered 2014-10-06 – 2014-10-10 (×8): 3 mL via INTRAVENOUS

## 2014-10-05 MED ORDER — SODIUM CHLORIDE 0.9 % IJ SOLN: 3.0000 mL | INTRAMUSCULAR | Status: DC | PRN

## 2014-10-05 MED ORDER — PANTOPRAZOLE SODIUM 40 MG PO TBEC
40.0000 mg | DELAYED_RELEASE_TABLET | Freq: Every day | ORAL | Status: DC
Start: 2014-10-06 — End: 2014-10-11
  Administered 2014-10-06 – 2014-10-11 (×6): 40 mg via ORAL
  Filled 2014-10-05 (×6): qty 1

## 2014-10-05 MED ORDER — PIPERACILLIN-TAZOBACTAM 3.375 G IVPB 30 MIN
3.3750 g | Freq: Once | INTRAVENOUS | Status: AC
  Administered 2014-10-05: 3.375 g via INTRAVENOUS
  Filled 2014-10-05: qty 50

## 2014-10-05 MED ORDER — ACETAMINOPHEN 325 MG PO TABS
650.0000 mg | ORAL_TABLET | Freq: Four times a day (QID) | ORAL | Status: DC | PRN
  Administered 2014-10-06 (×2): 650 mg via ORAL
  Filled 2014-10-05 (×2): qty 2

## 2014-10-05 MED ORDER — PIPERACILLIN-TAZOBACTAM 3.375 G IVPB
3.3750 g | Freq: Three times a day (TID) | INTRAVENOUS | Status: DC
  Administered 2014-10-06 – 2014-10-11 (×16): 3.375 g via INTRAVENOUS
  Filled 2014-10-05 (×20): qty 50

## 2014-10-05 MED ORDER — SODIUM CHLORIDE 0.9 % IV BOLUS (SEPSIS)
1000.0000 mL | INTRAVENOUS | Status: AC
Start: 2014-10-05 — End: 2014-10-05
  Administered 2014-10-05 (×2): 1000 mL via INTRAVENOUS

## 2014-10-05 NOTE — ED Notes (Signed)
Pt has been running fever all evening and [facility] was not [facility].

## 2014-10-05 NOTE — ED Provider Notes (Signed)
CSN: [id number]     Arrival date & time 10/05/14  1900 History   First MD Initiated Contact with Patient 10/05/14 1905     Chief Complaint  Patient presents with  . Fever      The history is provided by the patient.   [facility] with a fever of 102 and tachycardia.  Patient complains of coughing congestion over the past 2 days.  He reports she's had fever over the past 48 hours.  Denies abdominal pain.  Denies nausea vomiting.  Reports a history of recurrent urinary tract infections.  Denies significant discomfort or pain at this time.  Denies dysuria.   Past Medical History  Diagnosis Date  . Encephalopathy   . Anemia   . Renal disorder   . UTI (lower urinary tract infection)    Past Surgical History  Procedure Laterality Date  . Cystoscopy N/A 08/21/2014    Procedure: CYSTOSCOPY FLEXIBLE;  Surgeon: Chelsea AusStephen M Dahlstedt, MD;  Location: [facility];  Service: Urology;  Laterality: N/A;  at bedside   History reviewed. No pertinent family history. History  Substance Use Topics  . Smoking status: Never Smoker   . Smokeless tobacco: Current User  . Alcohol Use: No    Review of Systems  All other systems reviewed and are negative.     Allergies  Review of patient's allergies indicates no known allergies.  Home Medications   Prior to Admission medications   Medication Sig Start Date End Date Taking? Authorizing Provider  cholecalciferol (VITAMIN D) 1000 UNITS tablet Take 300 Units by mouth daily.   Yes Historical Provider, MD  cyanocobalamin (,VITAMIN B-12,) 1000 MCG/ML injection Inject 1 mL (1,000 mcg total) into the muscle once. Once every week for total four doses. First dose November 3rd 2015. 08/22/14  Yes Standley Brookinganiel P Goodrich, MD  folic acid (FOLVITE) 1 MG tablet Take 1 tablet (1 mg total) by mouth daily. 08/22/14  Yes Standley Brookinganiel P Goodrich, MD  Prenatal Vit-Fe Fumarate-FA (PRENATAL MULTIVITAMIN) TABS tablet Take 1 tablet by mouth daily at 12  noon. 08/22/14  Yes Standley Brookinganiel P Goodrich, MD  tamsulosin (FLOMAX) 0.4 MG CAPS capsule Take 0.4 mg by mouth daily.   Yes Historical Provider, MD  thiamine 100 MG tablet Take 1 tablet (100 mg total) by mouth daily. 08/22/14  Yes Standley Brookinganiel P Goodrich, MD  acetaminophen (TYLENOL) 325 MG tablet Take 650 mg by mouth every 6 (six) hours as needed.    Historical Provider, MD  ALBUTEROL SULFATE HFA IN Inhale into the lungs.    Historical Provider, MD  aspirin 81 MG tablet Take 81 mg by mouth daily.    Historical Provider, MD  atorvastatin (LIPITOR) 10 MG tablet Take 10 mg by mouth daily.    Historical Provider, MD  calcium-vitamin D (OSCAL WITH D) 500-200 MG-UNIT per tablet Take 1 tablet by mouth.    Historical Provider, MD  ciprofloxacin (CIPRO) 500 MG tablet Take 1 tablet (500 mg total) by mouth 2 (two) times daily. Last dose 11/3. 08/22/14   Standley Brookinganiel P Goodrich, MD  clonazePAM (KLONOPIN) 0.5 MG tablet Take 0.5 mg by mouth at bedtime.    Historical Provider, MD  docusate sodium (COLACE) 100 MG capsule Take 100 mg by mouth 2 (two) times daily.    Historical Provider, MD  doxycycline (VIBRA-TABS) 100 MG tablet Take 100 mg by mouth 2 (two) times daily. 7 day course for UTI COMPLETED    Historical Provider, MD  feeding supplement, ENSURE  COMPLETE, (ENSURE COMPLETE) LIQD Take 237 mLs by mouth 2 (two) times daily between meals. 08/22/14   Standley Brookinganiel P Goodrich, MD  ferrous sulfate 325 (65 FE) MG tablet Take 325 mg by mouth daily with breakfast.    Historical Provider, MD  gabapentin (NEURONTIN) 100 MG capsule Take 100 mg by mouth 3 (three) times daily.    Historical Provider, MD  HYDROcodone-acetaminophen (NORCO/VICODIN) 5-325 MG per tablet Take 1 tablet by mouth every 6 (six) hours as needed for moderate pain.    Historical Provider, MD  omega-3 acid ethyl esters (LOVAZA) 1 G capsule Take by mouth 2 (two) times daily.    Historical Provider, MD  pantoprazole (PROTONIX) 40 MG tablet Take 40 mg by mouth daily.    Historical  Provider, MD  polyethylene glycol (MIRALAX / GLYCOLAX) packet Take 17 g by mouth daily.    Historical Provider, MD   BP 151/84 mmHg  Pulse 125  Temp(Src) 102.7 F (39.3 C) (Rectal)  Resp 16  Ht 5\' 7"  (1.702 m)  Wt 157 lb 3 oz (71.3 kg)  BMI 24.61 kg/m2  SpO2 95% Physical Exam  Constitutional: He is oriented to person, place, and time. He appears well-developed and well-nourished.  HENT:  Head: Normocephalic and atraumatic.  Eyes: EOM are normal.  Neck: Normal range of motion.  Cardiovascular: Regular rhythm, normal heart sounds and intact distal pulses.   Tachycardia  Pulmonary/Chest: Effort normal and breath sounds normal. No respiratory distress.  Abdominal: Soft. He exhibits no distension. There is no tenderness.  Genitourinary:  Uncircumcised penis.  Candidal skin infection of his groin  Musculoskeletal: Normal range of motion.  Neurological: He is alert and oriented to person, place, and time.  Skin: Skin is warm and dry.  Psychiatric: He has a normal mood and affect. Judgment normal.  Nursing note and vitals reviewed.   ED Course  Procedures (including critical care time) Labs Review Labs Reviewed  URINALYSIS, ROUTINE W REFLEX MICROSCOPIC - Abnormal; Notable for the following:    APPearance TURBID (*)    Hgb urine dipstick LARGE (*)    Protein, ur 100 (*)    Leukocytes, UA LARGE (*)    All other components within normal limits  CBC WITH DIFFERENTIAL - Abnormal; Notable for the following:    WBC 15.3 (*)    Hemoglobin 12.9 (*)    RDW 15.7 (*)    Neutrophils Relative % 86 (*)    Neutro Abs 13.2 (*)    Lymphocytes Relative 9 (*)    All other components within normal limits  COMPREHENSIVE METABOLIC PANEL - Abnormal; Notable for the following:    Sodium 136 (*)    Chloride 95 (*)    Glucose, Bld 145 (*)    BUN 24 (*)    GFR calc non Af Amer 89 (*)    Anion gap 18 (*)    All other components within normal limits  URINE MICROSCOPIC-ADD ON - Abnormal; Notable  for the following:    Bacteria, UA MANY (*)    All other components within normal limits  URINE CULTURE  CULTURE, BLOOD (ROUTINE X 2)  CULTURE, BLOOD (ROUTINE X 2)  TROPONIN I  LACTIC ACID, PLASMA    Imaging Review Dg Chest Port 1 View  10/05/2014   CLINICAL DATA:  Two day history of fever and nonproductive cough.  EXAM: PORTABLE CHEST - 1 VIEW  COMPARISON:  Two-view chest x-ray 08/15/2014, 08/13/2014. CT chest 08/14/2014.  FINDINGS: Suboptimal inspiration accounts for crowded bronchovascular markings,  especially in the bases, and accentuates the cardiac silhouette. Taking this into account, Cardiac silhouette upper normal in size and stable. Lungs clear. Bronchovascular markings normal. Pulmonary vascularity normal. No visible pleural effusions. No pneumothorax.  IMPRESSION: Suboptimal inspiration.  No acute cardiopulmonary disease.   Electronically Signed   By: Hulan Saas M.D.   On: 10/05/2014 19:54  I personally reviewed the imaging tests through PACS system I reviewed available ER/hospitalization records through the EMR    EKG Interpretation   Date/Time:  Friday October 05 2014 19:29:49 EST Ventricular Rate:  118 PR Interval:  141 QRS Duration: 94 QT Interval:  341 QTC Calculation: 478 R Axis:   -13 Text Interpretation:  Sinus tachycardia Ventricular premature complex LVH  with secondary repolarization abnormality Borderline prolonged QT interval  No significant change was found Confirmed by Gayla Benn  MD, Inocencio Roy ([id number]) on  10/05/2014 8:33:03 PM      MDM   Final diagnoses:  Fever  Cough  Sepsis, due to unspecified organism  Acute cystitis with hematuria    Patient appears to have developing urosepsis.  Heart rate improved with 30 cc/kg bolus.  Lactate reassuring at 1.7.  White count noted at 15,000.  Vancomycin and Zosyn given early in his care.  Lead and urine cultures pending.  Zosyn should cover his urine.  Patient will need treatment with topical antifungals  for his candidal infection of his groin.    Lyanne Co, MD 10/05/14 2103

## 2014-10-05 NOTE — H&P (Signed)
PCP:   No primary care provider on file.   Chief Complaint:  fever  HPI: 63 yo male with 2 days of high fevers.  Not feeling well.  No real complaints.  No sob.  No cough.  No cp.  No rashes.  No dysuria but has freq utis.  No n/v/d.  Feels better with tx in the ED, mainly ivf.  Review of Systems:  Positive and negative as per HPI otherwise all other systems are negative  Past Medical History: Past Medical History  Diagnosis Date  . Encephalopathy   . Anemia   . Renal disorder   . UTI (lower urinary tract infection)    Past Surgical History  Procedure Laterality Date  . Cystoscopy N/A 08/21/2014    Procedure: CYSTOSCOPY FLEXIBLE;  Surgeon: Chelsea AusStephen M Dahlstedt, MD;  Location: [facility];  Service: Urology;  Laterality: N/A;  at bedside    Medications: Prior to Admission medications   Medication Sig Start Date End Date Taking? Authorizing Provider  cholecalciferol (VITAMIN D) 1000 UNITS tablet Take 300 Units by mouth daily.   Yes Historical Provider, MD  cyanocobalamin (,VITAMIN B-12,) 1000 MCG/ML injection Inject 1 mL (1,000 mcg total) into the muscle once. Once every week for total four doses. First dose November 3rd 2015. 08/22/14  Yes Standley Brookinganiel P Goodrich, MD  folic acid (FOLVITE) 1 MG tablet Take 1 tablet (1 mg total) by mouth daily. 08/22/14  Yes Standley Brookinganiel P Goodrich, MD  Prenatal Vit-Fe Fumarate-FA (PRENATAL MULTIVITAMIN) TABS tablet Take 1 tablet by mouth daily at 12 noon. 08/22/14  Yes Standley Brookinganiel P Goodrich, MD  tamsulosin (FLOMAX) 0.4 MG CAPS capsule Take 0.4 mg by mouth daily.   Yes Historical Provider, MD  thiamine 100 MG tablet Take 1 tablet (100 mg total) by mouth daily. 08/22/14  Yes Standley Brookinganiel P Goodrich, MD  acetaminophen (TYLENOL) 325 MG tablet Take 650 mg by mouth every 6 (six) hours as needed.    Historical Provider, MD  ALBUTEROL SULFATE HFA IN Inhale into the lungs.    Historical Provider, MD  aspirin 81 MG tablet Take 81 mg by mouth daily.    Historical Provider, MD   atorvastatin (LIPITOR) 10 MG tablet Take 10 mg by mouth daily.    Historical Provider, MD  calcium-vitamin D (OSCAL WITH D) 500-200 MG-UNIT per tablet Take 1 tablet by mouth.    Historical Provider, MD  ciprofloxacin (CIPRO) 500 MG tablet Take 1 tablet (500 mg total) by mouth 2 (two) times daily. Last dose 11/3. 08/22/14   Standley Brookinganiel P Goodrich, MD  clonazePAM (KLONOPIN) 0.5 MG tablet Take 0.5 mg by mouth at bedtime.    Historical Provider, MD  docusate sodium (COLACE) 100 MG capsule Take 100 mg by mouth 2 (two) times daily.    Historical Provider, MD  doxycycline (VIBRA-TABS) 100 MG tablet Take 100 mg by mouth 2 (two) times daily. 7 day course for UTI COMPLETED    Historical Provider, MD  feeding supplement, ENSURE COMPLETE, (ENSURE COMPLETE) LIQD Take 237 mLs by mouth 2 (two) times daily between meals. 08/22/14   Standley Brookinganiel P Goodrich, MD  ferrous sulfate 325 (65 FE) MG tablet Take 325 mg by mouth daily with breakfast.    Historical Provider, MD  gabapentin (NEURONTIN) 100 MG capsule Take 100 mg by mouth 3 (three) times daily.    Historical Provider, MD  HYDROcodone-acetaminophen (NORCO/VICODIN) 5-325 MG per tablet Take 1 tablet by mouth every 6 (six) hours as needed for moderate pain.    Historical Provider,  MD  omega-3 acid ethyl esters (LOVAZA) 1 G capsule Take by mouth 2 (two) times daily.    Historical Provider, MD  pantoprazole (PROTONIX) 40 MG tablet Take 40 mg by mouth daily.    Historical Provider, MD  polyethylene glycol (MIRALAX / GLYCOLAX) packet Take 17 g by mouth daily.    Historical Provider, MD    Allergies:  No Known Allergies  Social History:  reports that he has never smoked. He uses smokeless tobacco. He reports that he does not drink alcohol. His drug history is not on file.  Family History: History reviewed. No pertinent family history.  Physical Exam: Filed Vitals:   10/05/14 1921 10/05/14 1950  BP: 151/84   Pulse: 125   Temp: 102.7 F (39.3 C)   TempSrc: Rectal    Resp: 16   Height: 5\' 7"  (1.702 m)   Weight: 75.297 kg (166 lb) 71.3 kg (157 lb 3 oz)  SpO2: 95%    General appearance: alert, cooperative and no distress Head: Normocephalic, without obvious abnormality, atraumatic Eyes: negative Nose: Nares normal. Septum midline. Mucosa normal. No drainage or sinus tenderness. Neck: no JVD and supple, symmetrical, trachea midline Lungs: clear to auscultation bilaterally Heart: regular rate and rhythm, S1, S2 normal, no murmur, click, rub or gallop Abdomen: soft, non-tender; bowel sounds normal; no masses,  no organomegaly Extremities: extremities normal, atraumatic, no cyanosis or edema Pulses: 2+ and symmetric Skin: Skin color, texture, turgor normal. No rashes or lesions Neurologic: Grossly normal    Labs on Admission:   Recent Labs  10/05/14 1940  NA 136*  K 4.0  CL 95*  CO2 23  GLUCOSE 145*  BUN 24*  CREATININE 0.86  CALCIUM 9.8    Recent Labs  10/05/14 1940  AST 26  ALT 29  ALKPHOS 106  BILITOT 0.8  PROT 8.1  ALBUMIN 3.8    Recent Labs  10/05/14 1940  WBC 15.3*  NEUTROABS 13.2*  HGB 12.9*  HCT 39.4  MCV 93.1  PLT 348    Recent Labs  10/05/14 1940  TROPONINI <0.30   Radiological Exams on Admission: Dg Chest 2 View  10/05/2014   CLINICAL DATA:  Cough.  Fever.  Generalized weakness.  EXAM: CHEST  2 VIEW 2050 hr:  COMPARISON:  Portable chest x-ray earlier same date 1938 hr.  FINDINGS: Suboptimal inspiration accounts for crowded bronchovascular markings diffusely and atelectasis in the bases, and accentuates the cardiac silhouette. Taking this into account, cardiac silhouette upper normal in size to slightly enlarged. Hilar and mediastinal contours otherwise unremarkable. Lungs otherwise clear. No localized airspace consolidation. No pleural effusions. No pneumothorax. Normal pulmonary vascularity. Degenerative changes involving the thoracic spine.  IMPRESSION: Suboptimal inspiration accounts for bibasilar  atelectasis. No acute cardiopulmonary disease otherwise. Borderline to mild cardiomegaly.   Electronically Signed   By: Hulan Saashomas  Lawrence M.D.   On: 10/05/2014 21:11   Dg Chest Port 1 View  10/05/2014   CLINICAL DATA:  Two day history of fever and nonproductive cough.  EXAM: PORTABLE CHEST - 1 VIEW  COMPARISON:  Two-view chest x-ray 08/15/2014, 08/13/2014. CT chest 08/14/2014.  FINDINGS: Suboptimal inspiration accounts for crowded bronchovascular markings, especially in the bases, and accentuates the cardiac silhouette. Taking this into account, Cardiac silhouette upper normal in size and stable. Lungs clear. Bronchovascular markings normal. Pulmonary vascularity normal. No visible pleural effusions. No pneumothorax.  IMPRESSION: Suboptimal inspiration.  No acute cardiopulmonary disease.   Electronically Signed   By: Hulan Saashomas  Lawrence M.D.   On: 10/05/2014  19:71    Assessment/Plan  63 yo male with uti w sirs/sepsis  Principal Problem:   Sepsis/sirs-  Uti.  Cont zosyn.  rrecieved also vanc in ed, will discontinue at this time.  bp stable.  Tachycardia better with tylenol and ivf.  Lactic acid nml.  Admit to tele.  Urine and blood cx pending.  Active Problems:   UTI (lower urinary tract infection)  As above   Right leg DVT-  S/p ivc filter   Megaloblastic anemia due to B12 deficiency   Malnutrition of moderate degree   Fever   S/P IVC filter  Admit to tele.  Full code.  Should improve in the next day or so with abx.  Libby Goehring A 10/05/2014, 9:17 PM

## 2014-10-05 NOTE — Progress Notes (Signed)
Pharmacy Note:  Initial antibiotics for Vancomycin 1000mg  and Zosyn 3.375gm have ben ordered by EDP.  Estimated Creatinine Clearance: 80.1 mL/min (by C-G formula based on Cr of 0.86).   No Known Allergies  Filed Vitals:   10/05/14 1921  BP: 151/84  Pulse: 125  Temp: 102.7 F (39.3 C)  Resp: 16    Anti-infectives    Start     Dose/Rate Route Frequency Ordered Stop   10/05/14 1930  piperacillin-tazobactam (ZOSYN) IVPB 3.375 g     3.375 g100 mL/hr over 30 Minutes Intravenous  Once 10/05/14 1925     10/05/14 1930  vancomycin (VANCOCIN) IVPB 1000 mg/200 mL premix     1,000 mg200 mL/hr over 60 Minutes Intravenous  Once 10/05/14 1925        Plan: Continue Vancomycin 1gm IV q12hrs and Zosyn 3.375gm IV q8hrs F/U labs, renal fxn, and cultures   Wayland DenisHall, Julita Ozbun A, [facility] 10/05/2014 8:41 PM

## 2014-10-06 DIAGNOSIS — N210 Calculus in bladder: Secondary | ICD-10-CM

## 2014-10-06 HISTORY — DX: Calculus in bladder: N21.0

## 2014-10-06 LAB — BASIC METABOLIC PANEL
Anion gap: 13 (ref 5–15)
BUN: 20 mg/dL (ref 6–23)
CALCIUM: 9 mg/dL (ref 8.4–10.5)
CO2: 23 mEq/L (ref 19–32)
Chloride: 102 mEq/L (ref 96–112)
Creatinine, Ser: 0.87 mg/dL (ref 0.50–1.35)
GFR, EST NON AFRICAN AMERICAN: 89 mL/min — AB (ref >90)
GLUCOSE: 117 mg/dL — AB (ref 70–99)
POTASSIUM: 3.8 meq/L (ref 3.7–5.3)
Sodium: 138 mEq/L (ref 137–147)

## 2014-10-06 LAB — CBC
HCT: 35 % — ABNORMAL LOW (ref 39.0–52.0)
Hemoglobin: 11.1 g/dL — ABNORMAL LOW (ref 13.0–17.0)
MCH: 29.7 pg (ref 26.0–34.0)
MCHC: 31.7 g/dL (ref 30.0–36.0)
MCV: 93.6 fL (ref 78.0–100.0)
PLATELETS: 269 10*3/uL (ref 150–400)
RBC: 3.74 MIL/uL — ABNORMAL LOW (ref 4.22–5.81)
RDW: 15.6 % — AB (ref 11.5–15.5)
WBC: 10.8 10*3/uL — AB (ref 4.0–10.5)

## 2014-10-06 MED ORDER — IBUPROFEN 800 MG PO TABS
400.0000 mg | ORAL_TABLET | Freq: Four times a day (QID) | ORAL | Status: DC | PRN
  Administered 2014-10-06: 400 mg via ORAL
  Filled 2014-10-06: qty 1

## 2014-10-06 MED ORDER — TAMSULOSIN HCL 0.4 MG PO CAPS
0.4000 mg | ORAL_CAPSULE | Freq: Every day | ORAL | Status: DC
  Administered 2014-10-06 – 2014-10-11 (×6): 0.4 mg via ORAL
  Filled 2014-10-06 (×6): qty 1

## 2014-10-06 MED ORDER — NYSTATIN 100000 UNIT/GM EX POWD
Freq: Two times a day (BID) | CUTANEOUS | Status: DC
  Administered 2014-10-06 – 2014-10-11 (×11): via TOPICAL
  Filled 2014-10-06: qty 15

## 2014-10-06 MED ORDER — FOLIC ACID 1 MG PO TABS
1.0000 mg | ORAL_TABLET | Freq: Every day | ORAL | Status: DC
  Administered 2014-10-06 – 2014-10-11 (×6): 1 mg via ORAL
  Filled 2014-10-06 (×6): qty 1

## 2014-10-06 MED ORDER — PIPERACILLIN-TAZOBACTAM 3.375 G IVPB
INTRAVENOUS | Status: AC
  Filled 2014-10-06: qty 50

## 2014-10-06 MED ORDER — SODIUM CHLORIDE 0.9 % IV BOLUS (SEPSIS)
500.0000 mL | Freq: Once | INTRAVENOUS | Status: AC
  Administered 2014-10-06: 500 mL via INTRAVENOUS

## 2014-10-06 MED ORDER — VITAMIN B-1 100 MG PO TABS
100.0000 mg | ORAL_TABLET | Freq: Every day | ORAL | Status: DC
  Administered 2014-10-06 – 2014-10-11 (×6): 100 mg via ORAL
  Filled 2014-10-06 (×6): qty 1

## 2014-10-06 MED ORDER — PRENATAL MULTIVITAMIN CH
1.0000 | ORAL_TABLET | Freq: Every day | ORAL | Status: DC
  Administered 2014-10-06 – 2014-10-11 (×6): 1 via ORAL
  Filled 2014-10-06 (×7): qty 1

## 2014-10-06 NOTE — Progress Notes (Signed)
Notified MD. Pt's temp has been fluctuating and is now 99.3. Pt is still running sinus tach. Currently pt is receiving IV Zosyn. Pt received Tylenol at 0220. Will continue to monitor the pt.

## 2014-10-06 NOTE — Progress Notes (Addendum)
PROGRESS NOTE  Edwin [name] VHQ:[id number] DOB: 09/25/1951 DOA: 10/05/2014 PCP: No primary care provider on file.  Urologist: Dr. Retta Dionesahlstedt  Summary: 63 year old man presented with 2 day history of fever, coughing and congestion. Saw urologist 12/9 for stone removal, but UTI found and was placed on abx. Admitted for sepsis secondary to UTI.   Assessment/Plan: 1. UTI with sepsis. Febrile but hemodynamics are stable. Lactate was normal on admission. 2. Large bladder calculus. Follow-up with urology as outpatient for stone removal, per son this has been postponed for another month 3. Cutaneous candidiasis of the groin. 4. Profound vitamin B12 deficiency. 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 5. Right lower extremity DVT, status post IVC filter placement. Lower extremity edema has resolved. Consider apixaban after he has undergone definitive urology treatment. Then would suggest removal of IVC filter.   Overall appears improved, though still febrile. Plan to continue empiric antibiotics, follow-up culture. He was on antibiotics prior to admission so culture may be unrevealing. Clinically improving on current antibiotics.  Topical antifungals.  Updated son by telephone  [name]  Code Status: full code DVT prophylaxis: Lovenox Family Communication:  [name]  Edwin Sacksaniel Klayten Jolliff, MD  [facility]  Pager [phone] If 7PM-7AM, please contact night-coverage at [email or web address], password [email or web address] 10/06/2014, 12:33 PM  LOS: 1 day   Consultants:    Procedures:    Antibiotics:  Zosyn 12/11 >>  Vancomycin 12/11  HPI/Subjective: Feeling better, no pain, no n/v. No abdominal pain. Eating fine. No complaints.  Objective: Filed Vitals:   10/05/14 2247 10/06/14 0215 10/06/14 0327 10/06/14 0539  BP: 157/75  143/94 132/70  Pulse: 107   109  Temp: 98.7 F (37.1 C) 103.2 F (39.6 C)  102.6 F (39.2 C) 99.3 F (37.4 C)  TempSrc: Oral Oral Oral Oral  Resp: 18  18 18   Height:      Weight: 77.4 kg (170 lb 10.2 oz)     SpO2: 99%  95% 97%    Intake/Output Summary (Last 24 hours) at 10/06/14 1233 Last data filed at 10/06/14 1051  Gross per 24 hour  Intake    240 ml  Output   1325 ml  Net  -1085 ml     Filed Weights   10/05/14 1921 10/05/14 1950 10/05/14 2247  Weight: 75.297 kg (166 lb) 71.3 kg (157 lb 3 oz) 77.4 kg (170 lb 10.2 oz)    Exam:     Tmax 103.2. No hypoxia. Vital signs stable with mild tachycardia.  General: Appears calm, comfortable. Sitting up, eating lunch.  Psych: Alert. Speech fluent and clear. Oriented to self, hospital, month. Reports it is 2012.  CV: Regular rate and rhythm. No murmur, rub or gallop. No lower extremity edema.  Respiratory: Clear to auscultation bilaterally. No wheezes, rales or rhonchi. Normal respiratory effort.  Abdomen: Soft  Musculoskeletal: Moves both legs well.  Data Reviewed:  Basic metabolic panel unremarkable. LFTs were unremarkable on admission. Troponin was negative on admission.  Lactic acid 1.7 on admission.  WBC 15.3 >> 10.8.  Urinalysis was markedly abnormal. Urine culture and blood cultures pending.  Scheduled Meds: . clonazePAM  0.5 mg Oral QHS  . enoxaparin (LOVENOX) injection  40 mg Subcutaneous Q24H  . gabapentin  100 mg Oral TID  . pantoprazole  40 mg Oral Daily  . piperacillin-tazobactam (ZOSYN)  IV  3.375 g Intravenous Q8H  . sodium chloride  3 mL Intravenous Q12H  .  sodium chloride  3 mL Intravenous Q12H   Continuous Infusions:   Principal Problem:   Sepsis Active Problems:   UTI (lower urinary tract infection)   Vitamin B12 deficiency   Right leg DVT   Megaloblastic anemia due to B12 deficiency   Malnutrition of moderate degree   Fever   S/P IVC filter   Bladder calculus   Time spent 20 minutes

## 2014-10-06 NOTE — Progress Notes (Signed)
Notified MD, pt has been running sinus tach and sustaining. MD stated to give pt a 500cc Normal Saline bonus. Will continue to monitor the pt.

## 2014-10-07 LAB — CBC
HCT: 35.5 % — ABNORMAL LOW (ref 39.0–52.0)
Hemoglobin: 11.4 g/dL — ABNORMAL LOW (ref 13.0–17.0)
MCH: 30.1 pg (ref 26.0–34.0)
MCHC: 32.1 g/dL (ref 30.0–36.0)
MCV: 93.7 fL (ref 78.0–100.0)
Platelets: 237 10*3/uL (ref 150–400)
RBC: 3.79 MIL/uL — ABNORMAL LOW (ref 4.22–5.81)
RDW: 15.4 % (ref 11.5–15.5)
WBC: 6.6 10*3/uL (ref 4.0–10.5)

## 2014-10-07 MED ORDER — GUAIFENESIN-DM 100-10 MG/5ML PO SYRP
5.0000 mL | ORAL_SOLUTION | ORAL | Status: DC | PRN
  Administered 2014-10-07 – 2014-10-09 (×4): 5 mL via ORAL
  Filled 2014-10-07 (×5): qty 5

## 2014-10-07 NOTE — Progress Notes (Signed)
PROGRESS NOTE  Nance PewCalvin First [id number]:[id number] DOB: 06/08/1951 DOA: 10/05/2014 PCP: No primary care provider on file.  Urologist: Dr. Retta Dionesahlstedt  Summary: 63 year old man presented with 2 day history of fever, coughing and congestion. Saw urologist 12/9 for stone removal, but UTI found and was placed on abx. Admitted for sepsis secondary to UTI.   Assessment/Plan: 1. UTI with sepsis. Leukocytosis has resolved. Afebrile for about 12 hours now. Clinically much improved. 2. Large bladder calculus. Follow-up with urology as outpatient for stone removal, per son this has been postponed for another month 3. Cutaneous candidiasis of the groin. 4. Profound vitamin B12 deficiency. 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 Continue vitamin B12 replacement as an outpatient. 5. Right lower extremity DVT, status post IVC filter placement. Lower extremity edema has resolved. Consider apixaban after he has undergone definitive urology treatment. Then would suggest removal of IVC filter.   Clinically improving. Follow fever curve. Follow-up culture data. Continue empiric Zosyn.   Discontinue telemetry.  Anticipate return to skilled nursing facility next 48 hours.  Code Status: full code DVT prophylaxis: Lovenox Family Communication:  [facility]  Brendia Sacksaniel Goodrich, MD  [facility]  Pager [phone] If 7PM-7AM, please contact night-coverage at [email or web address], password [email or web address] 10/07/2014, 4:11 PM  LOS: 2 days   Consultants:    Procedures:    Antibiotics:  Zosyn 12/11 >>  Vancomycin 12/11  HPI/Subjective: Complaints of some cough. Otherwise doing well. No vomiting, no abdominal pain. Tolerating diet.  Objective: Filed Vitals:   10/06/14 1550 10/06/14 2214 10/07/14 0611 10/07/14 1441  BP:  139/73 106/59 112/63  Pulse:  110 88 106  Temp: 100 F (37.8 C) 103.2 F (39.6 C) 98.4 F (36.9 C) 99.3 F (37.4 C)  TempSrc: Oral Oral Oral Oral    Resp:  17 18 20   Height:      Weight:      SpO2:  96% 96% 97%    Intake/Output Summary (Last 24 hours) at 10/07/14 1611 Last data filed at 10/07/14 0917  Gross per 24 hour  Intake    360 ml  Output    250 ml  Net    110 ml     Filed Weights   10/05/14 1921 10/05/14 1950 10/05/14 2247  Weight: 75.297 kg (166 lb) 71.3 kg (157 lb 3 oz) 77.4 kg (170 lb 10.2 oz)    Exam:     Tmax 103.2. No hypoxia. Vital signs stable with mild tachycardia.  Gen. Appears calm, comfortable. Speech fluent and clear.  Cardiovascular regular rate and rhythm. No murmur, rub or gallop. Telemetry sinus rhythm. No lower extremity edema.  Respiratory clear to auscultation bilaterally. No wheezes, rales or rhonchi. Normal respiratory effort.  Abdomen soft.  Data Reviewed:  UOP 650  WBC 15.3 >> 10.8 >> 6.6.  Urinalysis was markedly abnormal. Urine culture and blood cultures pending.  Scheduled Meds: . clonazePAM  0.5 mg Oral QHS  . enoxaparin (LOVENOX) injection  40 mg Subcutaneous Q24H  . folic acid  1 mg Oral Daily  . gabapentin  100 mg Oral TID  . nystatin   Topical BID  . pantoprazole  40 mg Oral Daily  . piperacillin-tazobactam (ZOSYN)  IV  3.375 g Intravenous Q8H  . prenatal multivitamin  1 tablet Oral Q1200  . sodium chloride  3 mL Intravenous Q12H  . sodium chloride  3 mL Intravenous Q12H  . tamsulosin  0.4 mg Oral Daily  . thiamine  100 mg Oral Daily  Continuous Infusions:   Principal Problem:   Sepsis Active Problems:   UTI (lower urinary tract infection)   Vitamin B12 deficiency   Right leg DVT   Megaloblastic anemia due to B12 deficiency   Malnutrition of moderate degree   Fever   S/P IVC filter   Bladder calculus   Time spent 15 minutes

## 2014-10-08 NOTE — Progress Notes (Signed)
PROGRESS NOTE  Edwin PewCalvin [name] [id number] DOB: 09/09/1951 DOA: 10/05/2014 PCP: No primary care provider on file.  Urologist: Dr. Retta Dionesahlstedt  Summary: 63 year old man presented with 2 day history of fever, coughing and congestion. Saw urologist 12/9 for stone removal, but UTI found and was placed on abx. Admitted for sepsis secondary to UTI.   Assessment/Plan: 1. UTI with sepsis. Leukocytosis has resolved. Developed fever last night 103.Clinically much improved.Urine culture is growing GNR. Continue zosyn. 2. Large bladder calculus. Follow-up with urology as outpatient for stone removal, per son this has been postponed for another month 3. Profound vitamin B12 deficiency. 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 Continue vitamin B12 replacement as an outpatient. 4. Right lower extremity DVT, status post IVC filter placement. Lower extremity edema has resolved. Consider apixaban after he has undergone definitive urology treatment. Then would suggest removal of IVC filter.    Code Status: Full code DVT prophylaxis: Lovenox Family Communication:  [name]  Meredeth IdeGagan S Righteous [name] [facility]  Pager [phone] If 7PM-7AM, please contact night-coverage at [email or web address], password [email or web address] 10/08/2014, 10:33 AM  LOS: 3 days   Consultants:    Procedures:    Antibiotics:  Zosyn 12/11 >>  Vancomycin 12/11  HPI/Subjective: Patient seen and examined, denies any new complaints.  Objective: Filed Vitals:   10/07/14 0611 10/07/14 1441 10/07/14 2142 10/08/14 0533  BP: 106/59 112/63 121/61 121/72  Pulse: 88 106 113 104  Temp: 98.4 F (36.9 C) 99.3 F (37.4 C) 103.1 F (39.5 C) 100.5 F (38.1 C)  TempSrc: Oral Oral Oral Oral  Resp: 18 20 20 18   Height:      Weight:      SpO2: 96% 97% 92% 95%    Intake/Output Summary (Last 24 hours) at 10/08/14 1033 Last data filed at 10/08/14 0659  Gross per 24 hour  Intake    570 ml  Output    1250 ml  Net   -680 ml     Filed Weights   10/05/14 1921 10/05/14 1950 10/05/14 2247  Weight: 75.297 kg (166 lb) 71.3 kg (157 lb 3 oz) 77.4 kg (170 lb 10.2 oz)    Exam:    Physical Exam: Eyes: No icterus, extraocular muscles intact  Lungs: Normal respiratory effort, bilateral clear to auscultation, no crackles or wheezes.  Heart: Regular rate and rhythm, S1 and S2 normal, no murmurs, rubs auscultated Abdomen: BS normoactive,soft,nondistended,non-tender to palpation,no organomegaly Extremities: No pretibial edema, no erythema, no cyanosis, no clubbing Neuro : Alert and oriented to time, place and person, No focal deficits Skin: No rashes seen on exam  Scheduled Meds: . clonazePAM  0.5 mg Oral QHS  . enoxaparin (LOVENOX) injection  40 mg Subcutaneous Q24H  . folic acid  1 mg Oral Daily  . gabapentin  100 mg Oral TID  . nystatin   Topical BID  . pantoprazole  40 mg Oral Daily  . piperacillin-tazobactam (ZOSYN)  IV  3.375 g Intravenous Q8H  . prenatal multivitamin  1 tablet Oral Q1200  . sodium chloride  3 mL Intravenous Q12H  . sodium chloride  3 mL Intravenous Q12H  . tamsulosin  0.4 mg Oral Daily  . thiamine  100 mg Oral Daily   Continuous Infusions:   Principal Problem:   Sepsis Active Problems:   UTI (lower urinary tract infection)   Vitamin B12 deficiency   Right leg DVT   Megaloblastic anemia due to B12 deficiency   Malnutrition of moderate degree  Fever   S/P IVC filter   Bladder calculus   Time spent 20 minutes

## 2014-10-08 NOTE — Progress Notes (Signed)
Patient up in the chair tolerated well 

## 2014-10-08 NOTE — Plan of Care (Signed)
Problem: Phase I Progression Outcomes Goal: Voiding-avoid urinary catheter unless indicated Outcome: Progressing Condom catheter in use     

## 2014-10-08 NOTE — Plan of Care (Signed)
Problem: Phase I Progression Outcomes Goal: Pain controlled with appropriate interventions Outcome: Completed/Met Date Met:  10/08/14 No c/o pain

## 2014-10-08 NOTE — Plan of Care (Signed)
Problem: Phase I Progression Outcomes Goal: OOB as tolerated unless otherwise ordered Outcome: Progressing Patient out of bed to chair tolerated well

## 2014-10-08 NOTE — Clinical Social Work Psychosocial (Signed)
Clinical Social Work Department BRIEF PSYCHOSOCIAL ASSESSMENT 10/08/2014  Patient:  [facility]     Account Number:  [id number]     Admit date:  10/05/2014  Clinical Social Worker:  Legrand Como  Date/Time:  10/08/2014 01:05 PM  Referred by:  CSW  Date Referred:  10/08/2014 Referred for  SNF Placement   Other Referral:   Interview type:  Patient Other interview type:   Son, Clois Treanor at [place] Status:  FACILITY Admitted from facility:  [facility] Level of care:  [place] Primary support name:  Srihaan Mastrangelo Primary support relationship to patient:  CHILD, ADULT Degree of support available:   Patient's son is very involved in patient's care.    CURRENT CONCERNS  Other Concerns:    SOCIAL WORK ASSESSMENT / PLAN CSW met with patient who was alert and oriented. Patient indicated that he had been at [facility] for about a month to a month and a half. He stated that prior to that he was living in his home in [place] alone.  He stated that he was having a difficult time managing his care in his home due to clutter in the home and the home not being wheelchair accessible.  Patient indicated that he wants to return to [facility] upon discharge. Patient stated that he uses a wheelchair. He stated that since going to [facility] he has had a lot of support and visits from his son, son's girlfriend, cousins, friends, brothers and sisters.  He stated that once he has completed his rehab his son has arranged for him to live in an apartment where he will have assistance helping him with cleaning, meal prep, bathing and other ADL's that he needs assistance with.  CSW spoke with patient's son, Tareek Sabo. Mr. Owens Shark confirmed patient's statements. He stated that he wanted patient to return to [facility] upon discharge. Mr. Owens Shark had questions about patient's bed being held and CSW advised him  that he would have to speak to the facility about these issues.  CSW spoke with Claiborne Billings at [facility].  She too confirmed patient's statements. She stated that patient was allowed to return to the facility upon discharge.   Assessment/plan status:   Other assessment/ plan:   Information/referral to community resources:    PATIENT'S/FAMILY'S RESPONSE TO PLAN OF CARE: Patient, family and facility agreeable to patient returning upon discharge.   Ambrose Pancoast, [place]

## 2014-10-08 NOTE — Care Management Utilization Note (Signed)
UR completed 

## 2014-10-08 NOTE — Evaluation (Signed)
Physical Therapy Evaluation Patient Details Name: Edwin [name] MRN: [id number] DOB: 10/22/1951 Today's Date: 12/14/[name]2015   History of Present Illness  Pt is a 63 year old male who is admitted with UTI and sepsis.  [facility] and he reports that he had been progressing well,.  About 1 month ago he was admitted with encepalopathy and failure to thrive.  He had become extremely weak and unable to ambulate.  He has a history of megaloblastic anemia, malnutrition and LLE DVT with IVC filter.   Pt reports that he had become stronger at rehab and able to ambulate in the hallways with therapist.  He now states that he has not been out of bed since arrival and feels very weak.  Clinical Impression   Pt was seen for evaluation.  He was very pleasant and cooperative.  His functional strength is quite decreased (2+ to 3-/5), transfers require min assist and he was only able to ambulate 15' with a walker and mod assistance.  His standing balance is compromised and he frequently falls backward.  Because of significant weakness and deconditioning, I am strongly recommending that pt return to SNF at d/c.  He is in agreement with this.    Follow Up Recommendations SNF    Equipment Recommendations  None recommended by PT    Recommendations for Other Services  OT     Precautions / Restrictions Precautions Precautions: Fall Restrictions Weight Bearing Restrictions: No      Mobility  Bed Mobility Overal bed mobility: Needs Assistance Bed Mobility: Supine to Sit     Supine to sit: Min assist;HOB elevated        Transfers Overall transfer level: Needs assistance Equipment used: Rolling walker (2 wheeled) Transfers: Sit to/from Stand Sit to Stand: Mod assist         General transfer comment: pt needed to rock back and forth several times before able to stand to the walker...he then falls backward unless stabilized by therapist...he also tends to stand with  his knees flexed and needs verbal/manual reinforcement to extend knees.  Ambulation/Gait Ambulation/Gait assistance: Mod assist Ambulation Distance (Feet): 15 Feet Assistive device: Rolling walker (2 wheeled) Gait Pattern/deviations: Decreased step length - left;Decreased step length - right;Leaning posteriorly;Trunk flexed   Gait velocity interpretation: Below normal speed for age/gender General Gait Details: gait with walker is unstable and he is unable to achieve full knee extension at heel strike...he had a loss of balance to the right and needed stabilization by therapist  Stairs            Wheelchair Mobility    Modified Rankin (Stroke Patients Only)       Balance Overall balance assessment: Needs assistance Sitting-balance support: No upper extremity supported;Feet supported Sitting balance-Leahy Scale: Good     Standing balance support: Bilateral upper extremity supported Standing balance-Leahy Scale: Poor Standing balance comment: tends to fall posteriorly                             Pertinent Vitals/Pain Pain Assessment: No/denies pain    Home Living Family/patient expects to be discharged to:: Skilled nursing facility                      Prior Function Level of Independence: Needs assistance   Gait / Transfers Assistance Needed: pt ambulates with a walker and assistance, standby assist with transfers  ADL's / Homemaking  Assistance Needed: unknown at rehab        Hand Dominance   Dominant Hand: Right    Extremity/Trunk Assessment               Lower Extremity Assessment: Generalized weakness (functional strength 3-/5)      Cervical / Trunk Assessment: Kyphotic  Communication   Communication: No difficulties  Cognition Arousal/Alertness: Awake/alert Behavior During Therapy: WFL for tasks assessed/performed Overall Cognitive Status: Within Functional Limits for tasks assessed                       General Comments      Exercises        Assessment/Plan    PT Assessment Patient needs continued PT services  PT Diagnosis Difficulty walking;Generalized weakness   PT Problem List Decreased strength;Decreased activity tolerance;Decreased balance;Decreased mobility  PT Treatment Interventions Gait training;Functional mobility training;Therapeutic exercise   PT Goals (Current goals can be found in the Care Plan section) Acute Rehab PT Goals Patient Stated Goal: improve strength in order to return home PT Goal Formulation: With patient Time For Goal Achievement: 10/22/14 Potential to Achieve Goals: Good    Frequency Min 3X/week   Barriers to discharge   pt to discharge to SNF    Co-evaluation               End of Session Equipment Utilized During Treatment: Gait belt Activity Tolerance: Patient limited by fatigue Patient left: in chair;with call bell/phone within reach;with chair alarm set Nurse Communication: Mobility status         Time: [id number] PT Time Calculation (min) (ACUTE ONLY): 32 min   Charges:   PT Evaluation $Initial PT Evaluation Tier I: 1 Procedure     PT G CodesMyrlene Broker:          Allee Busk L 10/08/2014, 3:44 PM

## 2014-10-09 MED ORDER — BISACODYL 10 MG RE SUPP
10.0000 mg | Freq: Every day | RECTAL | Status: DC | PRN
  Administered 2014-10-09: 10 mg via RECTAL
  Filled 2014-10-09: qty 1

## 2014-10-09 NOTE — Clinical Social Work Psychosocial (Deleted)
Clinical Social Work Department BRIEF PSYCHOSOCIAL ASSESSMENT 10/09/2014  Patient:  [facility]     Account Number:  [id number]     Admit date:  10/05/2014  Clinical Social Worker:  Legrand Como  Date/Time:  10/09/2014 11:07 AM  Referred by:  CSW  Date Referred:  10/09/2014 Referred for  Substance Abuse   Other Referral:   Interview type:  Patient Other interview type:    PSYCHOSOCIAL DATA Living Status:  FAMILY Admitted from facility:   Level of care:   Primary support name:  Family Primary support relationship to patient:   Degree of support available:   Patient reports that he resides with his uncle.    CURRENT CONCERNS Current Concerns  Substance Abuse   Other Concerns:    SOCIAL WORK ASSESSMENT / PLAN CSW met with patient. Patient was alert and oriented. Patient indicated that he resides with his uncle and that he is employed part-time.  Patient indicated that he had gone to substance abuse rehab in July at a place in [place]. Patient could not remember the name of the facility.  Patient stated that he only stayed in rehab for one week due to not having health insurance.  He stated that he has done well with abstinence from cocaine since July, but he was with family as a result of his niece being killed and ran across "a bad thing" referring to cocaine. Patient indicated that he had done well with not using cocaine until he got with family during recently. CSW provided patient with literature on cocaine use.  CSW provided patient with referral information on area substance abuse providers. Patient indicated that he would be interested in going back to inpatient rehab.  CSW reviewed patient's previous notes and learned that he had previously been to [facility].  CSW asked patient was his previous inpatient hospital [facility] and patient indicated that it was.  CSW discussed with patient transportation  back to [facility] should they accept him. Patient indicated that he  would not have transportation to and from [facility] should they accept him and he would  be interested in going to an inpatient facility closer to the area. CSW contacted [facility] and left a message for the admission nurse, Melissa regarding  the possibility of patient coming on inpatient status.  CSW provided patient with information on [facility] and [facility].   Assessment/plan status:   Other assessment/ plan:   Information/referral to community resources:    PATIENT'S/FAMILY'S RESPONSE TO PLAN OF CARE: Patient accepted information on substance abuse and treatment. Patient was also interested in inpatient rehab.    Ambrose Pancoast, [place]

## 2014-10-09 NOTE — Progress Notes (Signed)
PROGRESS NOTE  Edwin [name] NGE:[id number] DOB: 12/31/1950 DOA: 10/05/2014 PCP: No primary care provider on file.  Urologist: Dr. Retta Dionesahlstedt  Summary: 63 year old man presented with 2 day history of fever, coughing and congestion. Saw urologist 12/9 for stone removal, but UTI found and was placed on abx. Admitted for sepsis secondary to UTI.   Assessment/Plan: 1. UTI with sepsis. Leukocytosis has resolved. Developed fever last night 103.Clinically much improved.Urine culture is growing GNR. Continue zosyn. 2. Large bladder calculus. Follow-up with urology as outpatient for stone removal, per son this has been postponed for another month 3. Profound vitamin B12 deficiency. 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 Continue vitamin B12 replacement as an outpatient. 4. Right lower extremity DVT, status post IVC filter placement. Lower extremity edema has resolved. Consider apixaban after he has undergone definitive urology treatment. Then would suggest removal of IVC filter.    Code Status: Full code DVT prophylaxis: Lovenox Family Communication:  [facility]  Meredeth IdeGagan S Lewis Keats [facility]  Pager [phone] If 7PM-7AM, please contact night-coverage at [email or web address], password [email or web address] 10/09/2014, 2:00 PM  LOS: 4 days   Consultants:    Procedures:    Antibiotics:  Zosyn 12/11 >>  Vancomycin 12/11  HPI/Subjective: Patient seen and examined, denies any new complaints.Urine culture is growing GNR. Final result is pending.  Objective: Filed Vitals:   10/08/14 1544 10/08/14 2059 10/09/14 0648 10/09/14 1100  BP: 122/55 131/74 109/67   Pulse: 91 87 85   Temp: 98.2 F (36.8 C) 98.5 F (36.9 C) 98.5 F (36.9 C) 98.3 F (36.8 C)  TempSrc: Oral Oral Oral Oral  Resp: 20 20 20    Height:      Weight:      SpO2: 93% 93% 95%     Intake/Output Summary (Last 24 hours) at 10/09/14 1400 Last data filed at 10/09/14 1300  Gross per 24  hour  Intake    820 ml  Output   1400 ml  Net   -580 ml     Filed Weights   10/05/14 1921 10/05/14 1950 10/05/14 2247  Weight: 75.297 kg (166 lb) 71.3 kg (157 lb 3 oz) 77.4 kg (170 lb 10.2 oz)    Exam:    Physical Exam: Eyes: No icterus, extraocular muscles intact  Lungs: Normal respiratory effort, bilateral clear to auscultation, no crackles or wheezes.  Heart: Regular rate and rhythm, S1 and S2 normal, no murmurs, rubs auscultated Abdomen: BS normoactive,soft,nondistended,non-tender to palpation,no organomegaly Extremities: No pretibial edema, no erythema, no cyanosis, no clubbing Neuro : Alert and oriented to time, place and person, No focal deficits Skin: No rashes seen on exam  Scheduled Meds: . clonazePAM  0.5 mg Oral QHS  . enoxaparin (LOVENOX) injection  40 mg Subcutaneous Q24H  . folic acid  1 mg Oral Daily  . gabapentin  100 mg Oral TID  . nystatin   Topical BID  . pantoprazole  40 mg Oral Daily  . piperacillin-tazobactam (ZOSYN)  IV  3.375 g Intravenous Q8H  . prenatal multivitamin  1 tablet Oral Q1200  . sodium chloride  3 mL Intravenous Q12H  . sodium chloride  3 mL Intravenous Q12H  . tamsulosin  0.4 mg Oral Daily  . thiamine  100 mg Oral Daily   Continuous Infusions:   Principal Problem:   Sepsis Active Problems:   UTI (lower urinary tract infection)   Vitamin B12 deficiency   Right leg DVT   Megaloblastic anemia due to B12 deficiency  Malnutrition of moderate degree   Fever   S/P IVC filter   Bladder calculus   Time spent 20 minutes

## 2014-10-09 NOTE — Consult Note (Signed)
Urology Consult  Consulting MD:  CC: urinary tract infection  HPI: This is a 63 year old male was seen by me in follow-up last week for chronic cystitis, bladder diverticuli and a large bladder stone.  He was admitted earlier this year with acute encephalopathy and was found to have significant B1, B12 and vitamin D deficiency.  At that point he had UTI which was treated as well.  Cystoscopy was performed revealing no significant neoplasia, but a large stone in his posterior right bladder with fibrinous tissue covering it.  The patient was seen in my office last week for follow-up.  He did have pyuria, but had a negative culture.  Because of the pyuria and the likelihood that this is an infection stone in his bladder, he was put on Macrodantin.  Apparently, he never did get the prescription filled.  He was recently admitted with fever and-urosepsis.  He is currently improved.  He has a catheter in place.  He is not having gross hematuria.  Urine culture is pending.  Blood cultures are negative.  PMH: Past Medical History  Diagnosis Date  . Encephalopathy   . Anemia   . Renal disorder   . UTI (lower urinary tract infection)     PSH: Past Surgical History  Procedure Laterality Date  . Cystoscopy N/A 08/21/2014    Procedure: CYSTOSCOPY FLEXIBLE;  Surgeon: Chelsea AusStephen M Domenick Quebedeaux, MD;  Location: [facility];  Service: Urology;  Laterality: N/A;  at bedside    Allergies: No Known Allergies  Medications: Prescriptions prior to admission  Medication Sig Dispense Refill Last Dose  . cholecalciferol (VITAMIN D) 1000 UNITS tablet Take 3,000 Units by mouth daily.    10/05/2014 at Unknown time  . cyanocobalamin (,VITAMIN B-12,) 1000 MCG/ML injection Inject 1 mL (1,000 mcg total) into the muscle once. Once every week for total four doses. First dose November 3rd 2015.   10/01/2014  . folic acid (FOLVITE) 1 MG tablet Take 1 tablet (1 mg total) by mouth daily.   10/05/2014 at Unknown time  . Prenatal  Vit-Fe Fumarate-FA (PRENATAL MULTIVITAMIN) TABS tablet Take 1 tablet by mouth daily at 12 noon.   10/05/2014 at Unknown time  . tamsulosin (FLOMAX) 0.4 MG CAPS capsule Take 0.4 mg by mouth daily.   10/05/2014 at Unknown time  . thiamine 100 MG tablet Take 1 tablet (100 mg total) by mouth daily.   10/05/2014 at Unknown time  . doxycycline (VIBRA-TABS) 100 MG tablet Take 100 mg by mouth 2 (two) times daily. 7 day course for UTI COMPLETED   Completed Course at Unknown time     Social History: History   Social History  . Marital Status: Single    Spouse Name: N/A    Number of Children: N/A  . Years of Education: N/A   Occupational History  . Not on file.   Social History Main Topics  . Smoking status: Never Smoker   . Smokeless tobacco: Current User  . Alcohol Use: No  . Drug Use: Not on file  . Sexual Activity: Not on file   Other Topics Concern  . Not on file   Social History Narrative    Family History: History reviewed. No pertinent family history.  Review of Systems: Positive: weakness, fever, slow stream. Negative:   A further 10 point review of systems was negative except what is listed in the HPI.  Physical Exam: @VITALS2 @ General: No acute distress.  Awake. Head:  Normocephalic.  Atraumatic. ENT:  EOMI.  Mucous membranes moist Neck:  Supple.  No lymphadenopathy. CV:  S1 present. S2 present. Regular rate. Pulmonary: Equal effort bilaterally.  Clear to auscultation bilaterally. Abdomen: Soft.  *non-tender to palpation. Skin:  Normal turgor.  No visible rash. Extremity: No gross deformity of bilateral upper extremities.  No gross deformity of    bilateral lower extremities. Neurologic: Alert. Appropriate mood.   Studies:  Recent Labs     10/07/14  0541  HGB  11.4*  WBC  6.6  PLT  237    No results for input(s): NA, K, CL, CO2, BUN, CREATININE, CALCIUM, GFRNONAA, GFRAA in the last 72 hours.  Invalid input(s): MAGNESIUM   No results for input(s):  INR, APTT in the last 72 hours.  Invalid input(s): PT   Invalid input(s): ABG    Assessment:  1.  Bladder outlet obstruction, bladder calculus, recurrent urinary tract infection.  Patient is improving.  Urine culture is growing gram-negative organisms, but identification is pending at this point.  He is improving.  Unfortunately, it doesn't sound like the patient took his nitrofurantoin which was prescribed at his visit with me last week.  2.  Multiple other medical problems, currently being treated  Plan: 1.  I think is okay to discontinue the patient's catheter at any point  2.  Treat infection appropriately-he does have a prescription of his pharmacy for nitrofurantoin which I think would be adequate treatment once his primary infection is treated.  He will definitely need to be on suppression until his definitive cystoscopic procedure is complete.  3.  He has an appointment to see me in January for cystoscopy, flow rate, and planned eventual cystoscopy and cystolitholapaxy.    [phone]

## 2014-10-09 NOTE — Progress Notes (Signed)
Physical Therapy Treatment Patient Details Name: Edwin [name] MRN: [id number] DOB: 03/14/1951 Today's Date: 10/09/2014    History of Present Illness Pt is a 63 year old male who is admitted with UTI and sepsis.  [facility] and he reports that he had been progressing well,.  About 1 month ago he was admitted with encepalopathy and failure to thrive.  He had become extremely weak and unable to ambulate.  He has a history of megaloblastic anemia, malnutrition and LLE DVT with IVC filter.   Pt reports that he had become stronger at rehab and able to ambulate in the hallways with therapist.  He now states that he has not been out of bed since arrival and feels very weak.    PT Comments    Pt was in the bed at the time of my visit at about 3 PM this afternoon.  He reports that he has not been out of the bed all day.  He was agreeable to work with me and tolerated strengthening exercise supine.    He was able to transfer to sitting without assistance, min assist to stand.  He has stable stance today with the walker.  Pt was able to ambulate 20' with the walker but felt too tired to go farther.  His balance was improved and his knee extension in weight bearing was WNL.  He was up in the chair at the end of visit.  Follow Up Recommendations  SNF     Equipment Recommendations  None recommended by PT    Recommendations for Other Services   none    Precautions / Restrictions Precautions Precautions: Fall Restrictions Weight Bearing Restrictions: No    Mobility  Bed Mobility Overal bed mobility: Needs Assistance Bed Mobility: Supine to Sit     Supine to sit: Supervision     General bed mobility comments: pt uses handrails to assist  Transfers Overall transfer level: Needs assistance Equipment used: Rolling walker (2 wheeled) Transfers: Sit to/from Stand Sit to Stand: Min assist         General transfer comment: transfer much less labored today  and standing balance is improved  Ambulation/Gait Ambulation/Gait assistance: Min assist Ambulation Distance (Feet): 20 Feet Assistive device: Rolling walker (2 wheeled) Gait Pattern/deviations: Trunk flexed   Gait velocity interpretation: Below normal speed for age/gender General Gait Details: pt is able to get better knee extension in weight bearing and had no loss of balance   Stairs            Wheelchair Mobility    Modified Rankin (Stroke Patients Only)       Balance Overall balance assessment: Needs assistance Sitting-balance support: No upper extremity supported;Feet supported Sitting balance-Leahy Scale: Good     Standing balance support: Bilateral upper extremity supported Standing balance-Leahy Scale: Fair                      Cognition Arousal/Alertness: Awake/alert Behavior During Therapy: WFL for tasks assessed/performed Overall Cognitive Status: Within Functional Limits for tasks assessed                      Exercises General Exercises - Lower Extremity Ankle Circles/Pumps: AROM;Both;10 reps;Supine Quad Sets: AROM;Both;10 reps;Supine Gluteal Sets: AROM;Both;10 reps;Supine Short Arc Quad: AROM;Both;10 reps;Supine Heel Slides: AROM;Both;10 reps;Supine Hip ABduction/ADduction: AROM;Both;10 reps;Supine Straight Leg Raises: AROM;Both;10 reps;Supine    General Comments        Pertinent Vitals/Pain Pain  Assessment: No/denies pain    Home Living                      Prior Function            PT Goals (current goals can now be found in the care plan section) Progress towards PT goals: Progressing toward goals    Frequency       PT Plan Current plan remains appropriate    Co-evaluation             End of Session Equipment Utilized During Treatment: Gait belt Activity Tolerance: Patient limited by fatigue Patient left: in chair;with call bell/phone within reach;with chair alarm set     Time:  1500-1553 PT Time Calculation (min) (ACUTE ONLY): 53 min  Charges:  $Gait Training: 8-22 mins $Therapeutic Exercise: 8-22 mins                    G Codes:      Konrad PentaBrown, Karalina Tift L 10/09/2014, 3:57 PM

## 2014-10-10 DIAGNOSIS — E440 Moderate protein-calorie malnutrition: Secondary | ICD-10-CM

## 2014-10-10 LAB — CULTURE, BLOOD (ROUTINE X 2)
CULTURE: NO GROWTH
Culture: NO GROWTH

## 2014-10-10 MED ORDER — IBUPROFEN 800 MG PO TABS: 400.0000 mg | ORAL_TABLET | Freq: Four times a day (QID) | ORAL | Status: DC | PRN

## 2014-10-10 NOTE — Progress Notes (Signed)
[facility] PROGRESS NOTE  Nance PewCalvin Cassedy [id number] DOB: 09/08/1951 DOA: 10/05/2014 PCP: No primary care provider on file.  Assessment/Plan: Principal Problem:   Sepsis - Resolved with IV antibiotics - most likely due to UTI  Active Problems:    UTI (lower urinary tract infection) - Placed on Zosyn - Awaiting urine culture    Right leg DVT - s/p IVC filter placed. Consideration is for apixaban after patient has undergone definitive urological treatment.  Then may consider removal of IVC filter    Malnutrition of moderate degree - currently on MV     Bladder calculus - Urology on board. Patient to f/u with urologist as outpatient after resolution of infection for definitive treatment.  Code Status: full Family Communication: No family at bedside Disposition Plan: Awaiting urine culture    Consultants:  Urology  Procedures:  None  Antibiotics:  Zosyn  HPI/Subjective: Pt has no new complaints.  Objective: Filed Vitals:   10/10/14 0439  BP: 126/65  Pulse: 82  Temp: 98.1 F (36.7 C)  Resp: 16    Intake/Output Summary (Last 24 hours) at 10/10/14 1319 Last data filed at 10/10/14 0900  Gross per 24 hour  Intake    630 ml  Output   1000 ml  Net   -370 ml   Filed Weights   10/05/14 1921 10/05/14 1950 10/05/14 2247  Weight: 75.297 kg (166 lb) 71.3 kg (157 lb 3 oz) 77.4 kg (170 lb 10.2 oz)    Exam:   General:  Pt in nad, alert and awake  Cardiovascular: rrr, no mrg  Respiratory: cta bl, no wheezes  Abdomen: soft, NT, ND  Musculoskeletal: no cyanosis or clubbing   Data Reviewed: Basic Metabolic Panel:  Recent Labs Lab 10/05/14 1940 10/06/14 0545  NA 136* 138  K 4.0 3.8  CL 95* 102  CO2 23 23  GLUCOSE 145* 117*  BUN 24* 20  CREATININE 0.86 0.87  CALCIUM 9.8 9.0   Liver Function Tests:  Recent Labs Lab 10/05/14 1940  AST 26  ALT 29  ALKPHOS 106  BILITOT 0.8  PROT 8.1  ALBUMIN 3.8   No results for input(s): LIPASE,  AMYLASE in the last 168 hours. No results for input(s): AMMONIA in the last 168 hours. CBC:  Recent Labs Lab 10/05/14 1940 10/06/14 0545 10/07/14 0541  WBC 15.3* 10.8* 6.6  NEUTROABS 13.2*  --   --   HGB 12.9* 11.1* 11.4*  HCT 39.4 35.0* 35.5*  MCV 93.1 93.6 93.7  PLT 348 269 237   Cardiac Enzymes:  Recent Labs Lab 10/05/14 1940  TROPONINI <0.30   BNP (last 3 results)  Recent Labs  08/13/14 2224  PROBNP 82.8   CBG: No results for input(s): GLUCAP in the last 168 hours.  Recent Results (from the past 240 hour(s))  Urine culture     Status: None (Preliminary result)   Collection Time: 10/05/14  7:18 PM  Result Value Ref Range Status   Specimen Description URINE, CLEAN CATCH  Final   Special Requests NONE  Final   Culture  Setup Time   Final    10/06/2014 23:13 Performed at [facility]    Colony Count   Final    >=100,000 COLONIES/ML Performed at [facility]    Culture   Final    GRAM NEGATIVE RODS Performed at [facility]    Report Status PENDING  Incomplete  Blood Culture (routine x 2)     Status: None   Collection  Time: 10/05/14  8:10 PM  Result Value Ref Range Status   Specimen Description BLOOD LEFT ARM  Final   Special Requests BOTTLES DRAWN AEROBIC AND ANAEROBIC 6CC  Final   Culture NO GROWTH 5 DAYS  Final   Report Status 10/10/2014 FINAL  Final  Blood Culture (routine x 2)     Status: None   Collection Time: 10/05/14  8:17 PM  Result Value Ref Range Status   Specimen Description BLOOD LEFT HAND  Final   Special Requests BOTTLES DRAWN AEROBIC ONLY 6CC  Final   Culture NO GROWTH 5 DAYS  Final   Report Status 10/10/2014 FINAL  Final     Studies: No results found.  Scheduled Meds: . clonazePAM  0.5 mg Oral QHS  . enoxaparin (LOVENOX) injection  40 mg Subcutaneous Q24H  . folic acid  1 mg Oral Daily  . gabapentin  100 mg Oral TID  . nystatin   Topical BID  . pantoprazole  40 mg Oral Daily  .  piperacillin-tazobactam (ZOSYN)  IV  3.375 g Intravenous Q8H  . prenatal multivitamin  1 tablet Oral Q1200  . sodium chloride  3 mL Intravenous Q12H  . sodium chloride  3 mL Intravenous Q12H  . tamsulosin  0.4 mg Oral Daily  . thiamine  100 mg Oral Daily   Continuous Infusions:    Time spent: > 35 minutes    Penny PiaVEGA, Yaacov Koziol  [facility] Pager [phone]. If 7PM-7AM, please contact night-coverage at [email or web address], password [email or web address] 10/10/2014, 1:19 PM  LOS: 5 days

## 2014-10-10 NOTE — Progress Notes (Signed)
ANTIBIOTIC CONSULT NOTE - FOLLOW UP  Pharmacy Consult for Zosyn Indication: sepsis  No Known Allergies  Patient Measurements: Height: 5\' 7"  (170.2 cm) Weight: 170 lb 10.2 oz (77.4 kg) IBW/kg (Calculated) : 66.1  Vital Signs: Temp: 98.1 F (36.7 C) (12/16 0439) Temp Source: Oral (12/16 0439) BP: 126/65 mmHg (12/16 0439) Pulse Rate: 82 (12/16 0439) Intake/Output from previous day: 12/15 0701 - 12/16 0700 In: 870 [P.O.:720; IV Piggyback:150] Out: 1000 [Urine:1000] Intake/Output from this shift: Total I/O In: 240 [P.O.:240] Out: -   Labs: No results for input(s): WBC, HGB, PLT, LABCREA, CREATININE in the last 72 hours. Estimated Creatinine Clearance: 79.1 mL/min (by C-G formula based on Cr of 0.87). No results for input(s): VANCOTROUGH, VANCOPEAK, VANCORANDOM, GENTTROUGH, GENTPEAK, GENTRANDOM, TOBRATROUGH, TOBRAPEAK, TOBRARND, AMIKACINPEAK, AMIKACINTROU, AMIKACIN in the last 72 hours.   Microbiology: Recent Results (from the past 720 hour(s))  Urine culture     Status: None (Preliminary result)   Collection Time: 10/05/14  7:18 PM  Result Value Ref Range Status   Specimen Description URINE, CLEAN CATCH  Final   Special Requests NONE  Final   Culture  Setup Time   Final    10/06/2014 23:13 Performed at [facility]    Colony Count   Final    >=100,000 COLONIES/ML Performed at [facility]    Culture   Final    GRAM NEGATIVE RODS Performed at [facility]    Report Status PENDING  Incomplete  Blood Culture (routine x 2)     Status: None   Collection Time: 10/05/14  8:10 PM  Result Value Ref Range Status   Specimen Description BLOOD LEFT ARM  Final   Special Requests BOTTLES DRAWN AEROBIC AND ANAEROBIC 6CC  Final   Culture NO GROWTH 5 DAYS  Final   Report Status 10/10/2014 FINAL  Final  Blood Culture (routine x 2)     Status: None   Collection Time: 10/05/14  8:17 PM  Result Value Ref Range Status   Specimen Description BLOOD LEFT HAND   Final   Special Requests BOTTLES DRAWN AEROBIC ONLY 6CC  Final   Culture NO GROWTH 5 DAYS  Final   Report Status 10/10/2014 FINAL  Final    Anti-infectives    Start     Dose/Rate Route Frequency Ordered Stop   10/06/14 0600  vancomycin (VANCOCIN) IVPB 1000 mg/200 mL premix  Status:  Discontinued     1,000 mg200 mL/hr over 60 Minutes Intravenous Every 12 hours 10/05/14 2247 10/05/14 2258   10/06/14 0500  piperacillin-tazobactam (ZOSYN) IVPB 3.375 g     3.375 g12.5 mL/hr over 240 Minutes Intravenous Every 8 hours 10/05/14 2247     10/05/14 1930  piperacillin-tazobactam (ZOSYN) IVPB 3.375 g     3.375 g100 mL/hr over 30 Minutes Intravenous  Once 10/05/14 1925 10/05/14 2149   10/05/14 1930  vancomycin (VANCOCIN) IVPB 1000 mg/200 mL premix     1,000 mg200 mL/hr over 60 Minutes Intravenous  Once 10/05/14 1925 10/05/14 2257     Assessment: 63yo male admitted with sepsis and started on IV antibiotics.  Pt is now afebrile and renal fxn stable.  Goal of Therapy:  Eradicate infection.  Plan:  Continue Zosyn 3.375gm IV q8h, each dose over 4 hrs Monitor labs, cultures, and progress  Margo AyeHall, Leelah Hanna A 10/10/2014,1:20 PM

## 2014-10-10 NOTE — Progress Notes (Signed)
Physical Therapy Treatment Patient Details Name: Edwin [name] MRN: [id number] DOB: 03/14/1951 Today's Date: 10/10/2014    History of Present Illness Pt is a 63 year old male who is admitted with UTI and sepsis.  [name] and he reports that he had been progressing well,.  About 1 month ago he was admitted with encepalopathy and failure to thrive.  He had become extremely weak and unable to ambulate.  He has a history of megaloblastic anemia, malnutrition and LLE DVT with IVC filter.   Pt reports that he had become stronger at rehab and able to ambulate in the hallways with therapist.  He now states that he has not been out of bed since arrival and feels very weak.    PT Comments    Pt awake and agreeable to PT treatment today.  Pt able to perform supine bed exercises without increases in fatigue, though some assist required with hip abduction to maintain leg off bed to complete exercises.  Noted great improvements in gait distances today with pt being able to amb 110 feet today prior to returning to chair secondary to fatigue; one standing rest break was required during gait.  Seated exercises completed after gait as pt too fatigue to attempt gait a second time.  Min assist required with sit -> stand exercises to shift body weight forward as pt tends to lean backwards during transfer stating "i don't know why" when questioned if he feels himself leaning/why he's leaning backwards.  Pt continues to make great progress with PT, increasing reps of exercises and gait distance today.  Recommend continued PT in the hospital to increase functional mobility with transition to SNF for continued rehab.    Follow Up Recommendations  SNF     Equipment Recommendations  None recommended by PT       Precautions / Restrictions Precautions Precautions: Fall Restrictions Weight Bearing Restrictions: No    Mobility  Bed Mobility Overal bed mobility: Modified  Independent             General bed mobility comments: pt uses handrails to assist  Transfers Overall transfer level: Needs assistance Equipment used: Rolling walker (2 wheeled) Transfers: Sit to/from Stand Sit to Stand: Min assist         General transfer comment: Min assist to maintain balance as pt has difficulty transferring weight forward over feet, and tends to lean backwards  Ambulation/Gait Ambulation/Gait assistance: Min guard Ambulation Distance (Feet): 110 Feet Assistive device: Rolling walker (2 wheeled) Gait Pattern/deviations: Step-through pattern;Decreased dorsiflexion - right;Decreased dorsiflexion - left;Trunk flexed   Gait velocity interpretation: Below normal speed for age/gender       Balance   Sitting-balance support: No upper extremity supported;Feet supported Sitting balance-Leahy Scale: Good     Standing balance support: Bilateral upper extremity supported;During functional activity Standing balance-Leahy Scale: Fair Standing balance comment: Pt requires min assist during transfer to standing to shift weight forward, though during gait pt only required min guard to maintain balance                    Cognition Arousal/Alertness: Awake/alert Behavior During Therapy: WFL for tasks assessed/performed Overall Cognitive Status: Within Functional Limits for tasks assessed                      Exercises General Exercises - Lower Extremity Ankle Circles/Pumps: AROM;Both;20 reps;Supine Long Arc Quad: AROM;Both;15 reps;Seated Heel Slides: AROM;Both;10 reps;Supine Hip ABduction/ADduction: AAROM;Both;20  reps;Supine Straight Leg Raises: AROM;Both;10 reps;Supine Hip Flexion/Marching: AROM;Both;15 reps;Seated Toe Raises: AROM;Both;15 reps;Seated Other Exercises Other Exercises: Sit <-> Stand x5, with min assist to shift weight forward as pt has a tendency to lean backwards and use of RW        Pertinent Vitals/Pain Pain Assessment:  No/denies pain           PT Goals (current goals can now be found in the care plan section) Progress towards PT goals: Progressing toward goals    Frequency  Min 3X/week    PT Plan Current plan remains appropriate       End of Session Equipment Utilized During Treatment: Gait belt Activity Tolerance: Patient tolerated treatment well Patient left: in chair;with call bell/phone within reach;with chair alarm set     Time: 0926-1006 PT Time Calculation (min) (ACUTE ONLY): 40 min  Charges:  $Gait Training: 8-22 mins $Therapeutic Exercise: 23-37 mins                    Edwin [name] 10/10/2014, 10:12 AM

## 2014-10-11 DIAGNOSIS — E519 Thiamine deficiency, unspecified: Secondary | ICD-10-CM | POA: Diagnosis not present

## 2014-10-11 DIAGNOSIS — N390 Urinary tract infection, site not specified: Secondary | ICD-10-CM | POA: Diagnosis not present

## 2014-10-11 DIAGNOSIS — E538 Deficiency of other specified B group vitamins: Secondary | ICD-10-CM | POA: Diagnosis not present

## 2014-10-11 DIAGNOSIS — N401 Enlarged prostate with lower urinary tract symptoms: Secondary | ICD-10-CM | POA: Diagnosis not present

## 2014-10-11 DIAGNOSIS — G9349 Other encephalopathy: Secondary | ICD-10-CM | POA: Diagnosis not present

## 2014-10-11 DIAGNOSIS — I82411 Acute embolism and thrombosis of right femoral vein: Secondary | ICD-10-CM | POA: Diagnosis not present

## 2014-10-11 DIAGNOSIS — Z79899 Other long term (current) drug therapy: Secondary | ICD-10-CM | POA: Diagnosis not present

## 2014-10-11 DIAGNOSIS — N400 Enlarged prostate without lower urinary tract symptoms: Secondary | ICD-10-CM | POA: Diagnosis not present

## 2014-10-11 DIAGNOSIS — R310 Gross hematuria: Secondary | ICD-10-CM | POA: Diagnosis not present

## 2014-10-11 DIAGNOSIS — R262 Difficulty in walking, not elsewhere classified: Secondary | ICD-10-CM | POA: Diagnosis not present

## 2014-10-11 DIAGNOSIS — R41841 Cognitive communication deficit: Secondary | ICD-10-CM | POA: Diagnosis not present

## 2014-10-11 DIAGNOSIS — D649 Anemia, unspecified: Secondary | ICD-10-CM | POA: Diagnosis not present

## 2014-10-11 DIAGNOSIS — I82401 Acute embolism and thrombosis of unspecified deep veins of right lower extremity: Secondary | ICD-10-CM | POA: Diagnosis not present

## 2014-10-11 DIAGNOSIS — R278 Other lack of coordination: Secondary | ICD-10-CM | POA: Diagnosis not present

## 2014-10-11 DIAGNOSIS — N10 Acute tubulo-interstitial nephritis: Secondary | ICD-10-CM | POA: Diagnosis not present

## 2014-10-11 DIAGNOSIS — N210 Calculus in bladder: Secondary | ICD-10-CM | POA: Diagnosis not present

## 2014-10-11 DIAGNOSIS — G320 Subacute combined degeneration of spinal cord in diseases classified elsewhere: Secondary | ICD-10-CM | POA: Diagnosis not present

## 2014-10-11 DIAGNOSIS — N323 Diverticulum of bladder: Secondary | ICD-10-CM | POA: Diagnosis not present

## 2014-10-11 DIAGNOSIS — I739 Peripheral vascular disease, unspecified: Secondary | ICD-10-CM

## 2014-10-11 DIAGNOSIS — R1310 Dysphagia, unspecified: Secondary | ICD-10-CM

## 2014-10-11 DIAGNOSIS — N411 Chronic prostatitis: Secondary | ICD-10-CM | POA: Diagnosis not present

## 2014-10-11 DIAGNOSIS — R1319 Other dysphagia: Secondary | ICD-10-CM | POA: Diagnosis not present

## 2014-10-11 DIAGNOSIS — M6281 Muscle weakness (generalized): Secondary | ICD-10-CM | POA: Diagnosis not present

## 2014-10-11 DIAGNOSIS — B9689 Other specified bacterial agents as the cause of diseases classified elsewhere: Secondary | ICD-10-CM | POA: Diagnosis not present

## 2014-10-11 DIAGNOSIS — E46 Unspecified protein-calorie malnutrition: Secondary | ICD-10-CM | POA: Diagnosis not present

## 2014-10-11 DIAGNOSIS — E559 Vitamin D deficiency, unspecified: Secondary | ICD-10-CM | POA: Diagnosis not present

## 2014-10-11 DIAGNOSIS — R279 Unspecified lack of coordination: Secondary | ICD-10-CM | POA: Diagnosis not present

## 2014-10-11 DIAGNOSIS — N320 Bladder-neck obstruction: Secondary | ICD-10-CM | POA: Diagnosis not present

## 2014-10-11 DIAGNOSIS — A419 Sepsis, unspecified organism: Secondary | ICD-10-CM | POA: Diagnosis not present

## 2014-10-11 DIAGNOSIS — D538 Other specified nutritional anemias: Secondary | ICD-10-CM | POA: Diagnosis not present

## 2014-10-11 DIAGNOSIS — D61818 Other pancytopenia: Secondary | ICD-10-CM | POA: Diagnosis not present

## 2014-10-11 HISTORY — DX: Dysphagia, unspecified: R13.10

## 2014-10-11 HISTORY — DX: Peripheral vascular disease, unspecified: I73.9

## 2014-10-11 LAB — URINE CULTURE

## 2014-10-11 MED ORDER — SULFAMETHOXAZOLE-TRIMETHOPRIM 800-160 MG PO TABS: 0.5000 | ORAL_TABLET | Freq: Two times a day (BID) | ORAL | Status: DC

## 2014-10-11 MED ORDER — NITROFURANTOIN MONOHYD MACRO 100 MG PO CAPS
100.0000 mg | ORAL_CAPSULE | Freq: Two times a day (BID) | ORAL | Status: DC
  Filled 2014-10-11 (×3): qty 1

## 2014-10-11 MED ORDER — SULFAMETHOXAZOLE-TRIMETHOPRIM 800-160 MG PO TABS: 1.0000 | ORAL_TABLET | Freq: Two times a day (BID) | ORAL | Status: DC

## 2014-10-11 MED ORDER — PANTOPRAZOLE SODIUM 40 MG PO TBEC: 40.0000 mg | DELAYED_RELEASE_TABLET | Freq: Every day | ORAL | Status: DC

## 2014-10-11 MED ORDER — HYDROCODONE-ACETAMINOPHEN 5-325 MG PO TABS: 1.0000 | ORAL_TABLET | Freq: Four times a day (QID) | ORAL | Status: DC | PRN

## 2014-10-11 MED ORDER — CLONAZEPAM 0.5 MG PO TABS: 0.5000 mg | ORAL_TABLET | Freq: Every day | ORAL | Status: DC

## 2014-10-11 MED ORDER — NITROFURANTOIN MONOHYD MACRO 100 MG PO CAPS: 100.0000 mg | ORAL_CAPSULE | Freq: Two times a day (BID) | ORAL | Status: DC

## 2014-10-11 MED ORDER — GABAPENTIN 100 MG PO CAPS: 100.0000 mg | ORAL_CAPSULE | Freq: Three times a day (TID) | ORAL | Status: DC

## 2014-10-11 MED ORDER — SULFAMETHOXAZOLE-TRIMETHOPRIM 400-80 MG PO TABS: 1.0000 | ORAL_TABLET | Freq: Two times a day (BID) | ORAL | Status: DC

## 2014-10-11 NOTE — Discharge Summary (Addendum)
Physician Discharge Summary  Edwin [name] ZOX:[id number] DOB: 03/19/1951 DOA: 10/05/2014  PCP: No primary care provider on file.  Admit date: 10/05/2014 Discharge date: 10/11/2014  Time spent: 35 minutes  Recommendations for Outpatient Follow-up:  1. Patient discharged on Nitrofurantoin for UTI, please follow. Will need follow up with Dr Retta Dionesahlstedt of Urology in 1-2 weeks 2. Repeat CBC and BMP in 4-5 days 3. Has history of Right lower extremity DVT, s/p IVC filter placement. Currently off of anticoagulation. Consider restarting anticoagulation after definitive urologic treatment 4. Patient being discharged on Bactrim DS 2 week course, after completing Bactrim therapy may go back on Nitrofurantoin per Urology's recommendations.   Discharge Diagnoses:  Principal Problem:   Sepsis Active Problems:   UTI (lower urinary tract infection)   Vitamin B12 deficiency   Right leg DVT   Megaloblastic anemia due to B12 deficiency   Malnutrition of moderate degree   Fever   S/P IVC filter   Bladder calculus   Discharge Condition: Stable  Diet recommendation: Heart Healthy  Filed Weights   10/05/14 1921 10/05/14 1950 10/05/14 2247  Weight: 75.297 kg (166 lb) 71.3 kg (157 lb 3 oz) 77.4 kg (170 lb 10.2 oz)    History of present illness:  63 yo male with 2 days of high fevers. Not feeling well. No real complaints. No sob. No cough. No cp. No rashes. No dysuria but has freq utis. No n/v/d. Feels better with tx in the ED, mainly ivf.  Hospital Course:  Patient is a pleasant 63 year old gentleman with a past medical history of recurrent urinary tract infections, bladder diverticuli, history of large bladder stone who was admitted to the medicine service on 10/05/2014 presenting with complaints of generalized weakness, malaise, fevers having an overall functional decline. He was recently seen by his urologist in the office where he was noted to have pyuria. He was placed on Macrodantin but  it appeared that he did not get this prescription filled. During this hospitalization he was started on IV Zosyn for urosepsis. During this hospitalization he was evaluated by his urologist who recommended treating infection appropriately after which he would need to be on suppression with nitrofurantoin until definitive cystoscopic procedure is completed. Urine cultures growing Enterobacter that was susceptible to Bactrim. He was discharged on a 2 week course of Bactrim. Please start nitrofurantoin suppressive therapy once Bactrim is completed. Patient will need short-term follow-up with his urologist. On 10/11/2014 he show significant clinical improvement and was discharged to his skilled nursing facility.    Consultations:  Urology  Discharge Exam: Filed Vitals:   10/11/14 0542  BP: 123/73  Pulse: 77  Temp: 98.2 F (36.8 C)  Resp: 16     General: Pt in nad, alert and awake  Cardiovascular: rrr, no mrg  Respiratory: cta bl, no wheezes  Abdomen: soft, NT, ND  Musculoskeletal: no cyanosis or clubbing  Discharge Instructions You were cared for by a hospitalist during your hospital stay. If you have any questions about your discharge medications or the care you received while you were in the hospital after you are discharged, you can call the unit and asked to speak with the hospitalist on call if the hospitalist that took care of you is not available. Once you are discharged, your primary care physician will handle any further medical issues. Please note that NO REFILLS for any discharge medications will be authorized once you are discharged, as it is imperative that you return to your primary care physician (or establish a  relationship with a primary care physician if you do not have one) for your aftercare needs so that they can reassess your need for medications and monitor your lab values.  Discharge Instructions    Call MD for:  difficulty breathing, headache or visual  disturbances    Complete by:  As directed      Call MD for:  extreme fatigue    Complete by:  As directed      Call MD for:  hives    Complete by:  As directed      Call MD for:  persistant dizziness or light-headedness    Complete by:  As directed      Call MD for:  persistant nausea and vomiting    Complete by:  As directed      Call MD for:  redness, tenderness, or signs of infection (pain, swelling, redness, odor or green/yellow discharge around incision site)    Complete by:  As directed      Call MD for:  severe uncontrolled pain    Complete by:  As directed      Call MD for:  temperature >100.4    Complete by:  As directed      Diet - low sodium heart healthy    Complete by:  As directed      Increase activity slowly    Complete by:  As directed           Current Discharge Medication List    START taking these medications   Details  sulfamethoxazole-trimethoprim (BACTRIM DS,SEPTRA DS) 800-160 MG per tablet Take 1 tablet by mouth every 12 (twelve) hours. Qty: 28 tablet, Refills: 0      CONTINUE these medications which have CHANGED   Details  clonazePAM (KLONOPIN) 0.5 MG tablet Take 1 tablet (0.5 mg total) by mouth at bedtime. Qty: 20 tablet, Refills: 0    gabapentin (NEURONTIN) 100 MG capsule Take 1 capsule (100 mg total) by mouth 3 (three) times daily. Qty: 30 capsule, Refills: 1    HYDROcodone-acetaminophen (NORCO/VICODIN) 5-325 MG per tablet Take 1 tablet by mouth every 6 (six) hours as needed for moderate pain. Qty: 20 tablet, Refills: 0    pantoprazole (PROTONIX) 40 MG tablet Take 1 tablet (40 mg total) by mouth daily. Qty: 30 tablet, Refills: 1      CONTINUE these medications which have NOT CHANGED   Details  cholecalciferol (VITAMIN D) 1000 UNITS tablet Take 3,000 Units by mouth daily.     cyanocobalamin (,VITAMIN B-12,) 1000 MCG/ML injection Inject 1 mL (1,000 mcg total) into the muscle once. Once every week for total four doses. First dose November 3rd  2015.    folic acid (FOLVITE) 1 MG tablet Take 1 tablet (1 mg total) by mouth daily.    Prenatal Vit-Fe Fumarate-FA (PRENATAL MULTIVITAMIN) TABS tablet Take 1 tablet by mouth daily at 12 noon.    tamsulosin (FLOMAX) 0.4 MG CAPS capsule Take 0.4 mg by mouth daily.    thiamine 100 MG tablet Take 1 tablet (100 mg total) by mouth daily.      STOP taking these medications     doxycycline (VIBRA-TABS) 100 MG tablet        No Known Allergies Follow-up Information    Follow up with DAHLSTEDT, Bertram MillardSTEPHEN M, MD In 1 week.   Specialty:  Urology   Contact information:   [address] [place] [id number] [phone]        The results of significant  diagnostics from this hospitalization (including imaging, microbiology, ancillary and laboratory) are listed below for reference.    Significant Diagnostic Studies: Dg Chest 2 View  10/05/2014   CLINICAL DATA:  Cough.  Fever.  Generalized weakness.  EXAM: CHEST  2 VIEW 2050 hr:  COMPARISON:  Portable chest x-ray earlier same date 1938 hr.  FINDINGS: Suboptimal inspiration accounts for crowded bronchovascular markings diffusely and atelectasis in the bases, and accentuates the cardiac silhouette. Taking this into account, cardiac silhouette upper normal in size to slightly enlarged. Hilar and mediastinal contours otherwise unremarkable. Lungs otherwise clear. No localized airspace consolidation. No pleural effusions. No pneumothorax. Normal pulmonary vascularity. Degenerative changes involving the thoracic spine.  IMPRESSION: Suboptimal inspiration accounts for bibasilar atelectasis. No acute cardiopulmonary disease otherwise. Borderline to mild cardiomegaly.   Electronically Signed   By: Hulan Saas M.D.   On: 10/05/2014 21:11   Dg Chest Port 1 View  10/05/2014   CLINICAL DATA:  Two day history of fever and nonproductive cough.  EXAM: PORTABLE CHEST - 1 VIEW  COMPARISON:  Two-view chest x-ray 08/15/2014, 08/13/2014. CT chest 08/14/2014.   FINDINGS: Suboptimal inspiration accounts for crowded bronchovascular markings, especially in the bases, and accentuates the cardiac silhouette. Taking this into account, Cardiac silhouette upper normal in size and stable. Lungs clear. Bronchovascular markings normal. Pulmonary vascularity normal. No visible pleural effusions. No pneumothorax.  IMPRESSION: Suboptimal inspiration.  No acute cardiopulmonary disease.   Electronically Signed   By: Hulan Saas M.D.   On: 10/05/2014 19:54    Microbiology: Recent Results (from the past 240 hour(s))  Urine culture     Status: None   Collection Time: 10/05/14  7:18 PM  Result Value Ref Range Status   Specimen Description URINE, CLEAN CATCH  Final   Special Requests NONE  Final   Culture  Setup Time   Final    10/06/2014 23:13 Performed at [facility] Count   Final    >=100,000 COLONIES/ML Performed at [facility]    Culture   Final    ENTEROBACTER CLOACAE Performed at [facility]    Report Status 10/11/2014 FINAL  Final   Organism ID, Bacteria ENTEROBACTER CLOACAE  Final      Susceptibility   Enterobacter cloacae - MIC*    CEFAZOLIN >=64 RESISTANT Resistant     CEFTRIAXONE >=64 RESISTANT Resistant     CIPROFLOXACIN 2 INTERMEDIATE Intermediate     GENTAMICIN <=1 SENSITIVE Sensitive     LEVOFLOXACIN 4 INTERMEDIATE Intermediate     NITROFURANTOIN 64 INTERMEDIATE Intermediate     TOBRAMYCIN <=1 SENSITIVE Sensitive     TRIMETH/SULFA <=20 SENSITIVE Sensitive     PIP/TAZO >=128 RESISTANT Resistant     * ENTEROBACTER CLOACAE  Blood Culture (routine x 2)     Status: None   Collection Time: 10/05/14  8:10 PM  Result Value Ref Range Status   Specimen Description BLOOD LEFT ARM  Final   Special Requests BOTTLES DRAWN AEROBIC AND ANAEROBIC 6CC  Final   Culture NO GROWTH 5 DAYS  Final   Report Status 10/10/2014 FINAL  Final  Blood Culture (routine x 2)     Status: None   Collection Time: 10/05/14  8:17  PM  Result Value Ref Range Status   Specimen Description BLOOD LEFT HAND  Final   Special Requests BOTTLES DRAWN AEROBIC ONLY 6CC  Final   Culture NO GROWTH 5 DAYS  Final   Report Status 10/10/2014 FINAL  Final  Labs: Basic Metabolic Panel:  Recent Labs Lab 10/05/14 1940 10/06/14 0545  NA 136* 138  K 4.0 3.8  CL 95* 102  CO2 23 23  GLUCOSE 145* 117*  BUN 24* 20  CREATININE 0.86 0.87  CALCIUM 9.8 9.0   Liver Function Tests:  Recent Labs Lab 10/05/14 1940  AST 26  ALT 29  ALKPHOS 106  BILITOT 0.8  PROT 8.1  ALBUMIN 3.8   No results for input(s): LIPASE, AMYLASE in the last 168 hours. No results for input(s): AMMONIA in the last 168 hours. CBC:  Recent Labs Lab 10/05/14 1940 10/06/14 0545 10/07/14 0541  WBC 15.3* 10.8* 6.6  NEUTROABS 13.2*  --   --   HGB 12.9* 11.1* 11.4*  HCT 39.4 35.0* 35.5*  MCV 93.1 93.6 93.7  PLT 348 269 237   Cardiac Enzymes:  Recent Labs Lab 10/05/14 1940  TROPONINI <0.30   BNP: BNP (last 3 results)  Recent Labs  08/13/14 2224  PROBNP 82.8   CBG: No results for input(s): GLUCAP in the last 168 hours.     SignedJeralyn Bennett  [facility] 10/11/2014, 1:58 PM

## 2014-10-11 NOTE — Care Management Utilization Note (Signed)
UR complete 

## 2014-10-11 NOTE — Clinical Social Work Note (Signed)
Pt d/c today back to [facility]. Pt and son, [name] aware and agreeable. CSW spoke with YemenLasandra with [facility] who is faxing authorization to facility. [name] notified of return and will pick up pt this afternoon. D/C summary faxed.  Derenda FennelKara Annalissa [place]phe[place], [place] [phone]

## 2014-10-11 NOTE — Care Management Note (Signed)
    Page 1 of 1   10/11/2014     1:42:17 PM CARE MANAGEMENT NOTE 10/11/2014  Patient:  Edwin [name],Edwin [name]   Account Number:  [id number]  Date Initiated:  10/08/2014  Documentation initiated by:  Anibal HendersonBOLDEN,GENEVA  Subjective/Objective Assessment:   Admitted with SIRS/Sepsis. [facility] is [facility], and will be returning there at D/C. CSW aware and will assist with faciliting return there at  D/C     Action/Plan:   Per hospitalist, pt will D/C in 1-2 days when fever is down   Anticipated DC Date:  10/10/2014   Anticipated DC Plan:  SKILLED NURSING FACILITY  In-house referral  Clinical Social Worker      DC Planning Services  CM consult      Choice offered to / List presented to:             Status of service:  Completed, signed off Medicare Important Message given?  YES (If response is "NO", the following Medicare IM given date fields will be blank) Date Medicare IM given:  10/11/2014 Medicare IM given by:  Kathyrn SheriffHILDRESS,JESSICA Date Additional Medicare IM given:   Additional Medicare IM given by:    Discharge Disposition:  SKILLED NURSING FACILITY  Per UR Regulation:  Reviewed for med. necessity/level of care/duration of stay  If discussed at Long Length of Stay Meetings, dates discussed:   10/11/2014    Comments:  10/11/2014 1330 Kathyrn SheriffJessica Childress, RN, MSN, PCCN Pt being discharged back to SNF today. No CM needs. 10/09/2014 0745 Kathyrn SheriffJessica Childress, RN, MSN, PCCN 10/08/14 1450 Anibal HendersonGeneva Bolden RN/CM

## 2014-10-11 NOTE — Plan of Care (Signed)
Problem: Discharge Progression Outcomes Goal: Pain controlled with appropriate interventions Outcome: Completed/Met Date Met:  10/11/14 denies Goal: Other Discharge Outcomes/Goals Outcome: Completed/Met Date Met:  10/11/14 Discharged to [facility]

## 2014-10-15 ENCOUNTER — Non-Acute Institutional Stay (SKILLED_NURSING_FACILITY): Payer: Commercial Managed Care - HMO | Admitting: Internal Medicine

## 2014-10-15 DIAGNOSIS — N10 Acute tubulo-interstitial nephritis: Secondary | ICD-10-CM | POA: Diagnosis not present

## 2014-10-15 DIAGNOSIS — A498 Other bacterial infections of unspecified site: Secondary | ICD-10-CM

## 2014-10-15 DIAGNOSIS — B9689 Other specified bacterial agents as the cause of diseases classified elsewhere: Secondary | ICD-10-CM | POA: Diagnosis not present

## 2014-10-15 DIAGNOSIS — I82401 Acute embolism and thrombosis of unspecified deep veins of right lower extremity: Secondary | ICD-10-CM | POA: Diagnosis not present

## 2014-10-15 DIAGNOSIS — G320 Subacute combined degeneration of spinal cord in diseases classified elsewhere: Secondary | ICD-10-CM

## 2014-10-15 DIAGNOSIS — E538 Deficiency of other specified B group vitamins: Secondary | ICD-10-CM

## 2014-10-17 NOTE — Progress Notes (Addendum)
Patient ID: Edwin [name], male   DOB: 08/28/1951, 63 y.o.   MRN: [id number]               HISTORY & PHYSICAL  DATE:  10/15/2014       [facility]    LEVEL OF CARE:   SNF   CHIEF COMPLAINT:  Readmission to the facility, post stay at [facility] from 10/05/2014 through 10/11/2014.    HISTORY OF PRESENT ILLNESS:  This is a 63 year-old man whom I initially admitted to the facility in early November.  At that point, he had spent a complex hospitalization [facility], felt to be secondary to some form of metabolic encephalopathy.    During this hospitalization, he was found to have a vitamin B12 level of less than 10.  He had macrocytic anemia.    He was also discovered to have an extensive right leg DVT and was given anticoagulation.  However, he developed gross hematuria.  He, therefore, had an IVC filter placed.    He was seen by Urology and was felt to have a bladder stone causing the bleeding.  He has no overt tumors on cystoscopy.  He also had a large bladder diverticulum.  It was recommended that he have a repeat cystoscopy as an outpatient.  His anticoagulation has been on hold until that point.    The patient had his B12 aggressively replaced.  His hemoglobin came back to normal.    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  However, this has been improving with physical therapy.    On this occasion, he had a urine culture done in the facility that showed a very drug-resistant Enterobacter.  He initially received Keflex.  However, when the resistances came back, he received doxycycline.  However, he developed a high fever, rigors, and confusion and he was referred to the hospital.  There, urine culture grew Enterobacter, as well.  However, this was sensitive to Septra which was not the case for the culture result we had here.  In any case, he was treated with IV antibiotics, ultimately discharged on Septra.  The IV antibiotic  was Zosyn.  During the hospitalization, he was evaluated by his urologist, Dr. Retta Dionesahlstedt, who apparently had recommended Macrodantin for suppressive therapy.  I do not know that I ever saw this order.  He has been discharged on a two-week course of Bactrim and then nitrofurantoin for suppressive therapy.    He has an appointment with Dr. Retta Dionesahlstedt in one week, which should be on Thursday of this week.  I will alert the facility.    CURRENT MEDICATIONS:  Discharge medications include:      Septra DS 1 tablet every 12 hours.     Klonopin 0.5 q.h.s.    Neurontin 100 three times daily.    Vicodin 5/325, 1 q.6 p.r.n.     Protonix 40 q.d.     Vitamin D 3000 U daily.    Vitamin B12, 1000 IM monthly.     Folic acid 1 mg per day.    Flomax 0.4 q.d.     Thiamine 100 q.d.    SOCIAL HISTORY:   I have never really had any information on this man.     FAMILY HISTORY:  Not available.     REVIEW OF SYSTEMS:   CHEST/RESPIRATORY:  The patient is not complaining of shortness of breath.   CARDIAC:   No chest pain or  palpitations.   GI:  No nausea, vomiting, or altered bowel habits.   GU:  No current dysuria or hematuria.   NEUROLOGICAL:   States his gait is improving.  He is walking with therapy.    PHYSICAL EXAMINATION:   GENERAL APPEARANCE:  The patient is not in any distress.   CHEST/RESPIRATORY:  Clear air entry bilaterally.   CARDIOVASCULAR:  CARDIAC:   Heart sounds are normal.  He appears to be euvolemic.   GASTROINTESTINAL:  LIVER/SPLEEN/KIDNEYS:  No liver, no spleen.  No tenderness.    ABDOMEN:  No masses.   GENITOURINARY:  BLADDER:   Not distended.  There is no tenderness or CVA tenderness.   CIRCULATION:  EDEMA/VARICOSITIES:  Extremities:   He has no major edema.   PSYCHIATRIC:   MENTAL STATUS:   Continually improved.    ASSESSMENT/PLAN:                   Enterobacter UTI, which was complicated by a bladder stone.  He was given Zosyn in the hospital.  I had thought of  trying to treat him here with parenteral aminoglycosides.  However, he appeared to be too sick at the time to consider keeping here.  He has follow-up with Dr. Retta Dionesahlstedt this week.  He has a large bladder stone with pelvic lymphadenopathy.  He apparently has a repeat cystoscopy and bladder biopsies planned.  His blood cultures were negative.   Initial presentation with profound vitamin B12 and vitamin D deficiencies.  His gait has improved with replacement of both of these.  I suspect he had subacute combined degeneration of the cord as his initial neurologic exam was compatible with this..  Work-up in the initial hospitalization also showed flattening of the cord at the C3-C4 level on an MRI.  Nevertheless, the patient is much better.    Extensive right leg DVT with an IVC filter.  When Urology is finished with the work-up, he will probably need reintroduction of anticoagulation and consideration of removing his IVC filter.    Overall, this man is considerably improved versus the first time I saw him, even given his most recent hospitalization.  We will complete the full course of Septra and then start him on nitrofurantoin, although I have never really believed in chronic antibiotics for suppressive therapy.

## 2014-10-29 ENCOUNTER — Other Ambulatory Visit: Payer: Self-pay | Admitting: *Deleted

## 2014-10-29 MED ORDER — CLONAZEPAM 0.5 MG PO TABS: ORAL_TABLET | ORAL | Status: DC

## 2014-10-29 NOTE — Telephone Encounter (Signed)
[facility] 

## 2014-11-01 ENCOUNTER — Other Ambulatory Visit: Payer: Self-pay | Admitting: Urology

## 2014-11-16 ENCOUNTER — Encounter (HOSPITAL_COMMUNITY): Payer: Self-pay | Admitting: *Deleted

## 2014-11-16 NOTE — Progress Notes (Addendum)
                 Your procedure is scheduled on: Thursday, November 22, 2014  Report to [facility] at [phone]   Call this number if you have problems the morning of surgery: [phone]  Spoke with Levander Campionichard Williams Lpn at [facility] [phone] fax number [phone] and reviewed the pre op instructions by telephone Friday, November 16, 2014.  Mentioned in the conversation that the pre op instructions would be faxed to the facility today and I would  do a follow up call on Monday to confirm that the information was received and was clear.     Remember: Bring insurance card and picture ID, PLEASE SEND CURRENT  MEDICATION LIST AND TIME LAST MEDICATIONS TAKEN  WITH  PATIENT MORNING OF SURGERY.    Do not drink liquids or  eat food:After Midnight.Wednesday, November 21, 2014      Take these medicines the morning of surgery with A SIP OF WATER: Neurontin and Protonix                                 SEE [place] PREPARING FOR SURGERY SHEET     Do not wear jewelry, make-up or nail polish.  Do not wear lotions, powders, or perfumes. You may wear deodorant.             Men may shave face and neck.  Do not bring valuables to the hospital.  Contacts, dentures or bridgework may not be worn into surgery.  Leave suitcase in the car. After surgery it may be brought to your room.  For patients admitted to the hospital, checkout time is 11:00 AM the day of discharge.   Patients discharged the day of surgery will not be allowed to drive home.  Name and phone number of your driver: [facility] [phone]                      Call Jolyn Naponie Bryant, RN pre op nurse if needed [phone]   Main Number   [phone]              FAILURE TO FOLLOW THESE INSTRUCTIONS MAY RESULT IN THE CANCELLATION OF YOUR SURGERY.     PATIENT   SIGNATURE___________________________________________________

## 2014-11-21 MED ORDER — GENTAMICIN SULFATE 40 MG/ML IJ SOLN
5.0000 mg/kg | INTRAVENOUS | Status: AC
  Administered 2014-11-22: 390 mg via INTRAVENOUS
  Filled 2014-11-21: qty 9.75

## 2014-11-22 ENCOUNTER — Encounter (HOSPITAL_COMMUNITY): Payer: Self-pay | Admitting: *Deleted

## 2014-11-22 ENCOUNTER — Observation Stay (HOSPITAL_COMMUNITY)
Admission: RE | Admit: 2014-11-22 | Discharge: 2014-11-23 | Disposition: A | Discharge status: Home / Self Care | Payer: Medicare Other | Source: Ambulatory Visit | Attending: Urology | Admitting: Urology

## 2014-11-22 ENCOUNTER — Ambulatory Visit (HOSPITAL_COMMUNITY): Payer: Medicare Other | Admitting: Anesthesiology

## 2014-11-22 ENCOUNTER — Encounter (HOSPITAL_COMMUNITY)
Admission: RE | Disposition: A | Discharge status: Home / Self Care | Payer: Self-pay | Source: Ambulatory Visit | Attending: Urology

## 2014-11-22 DIAGNOSIS — N401 Enlarged prostate with lower urinary tract symptoms: Principal | ICD-10-CM | POA: Insufficient documentation

## 2014-11-22 DIAGNOSIS — N323 Diverticulum of bladder: Secondary | ICD-10-CM | POA: Diagnosis not present

## 2014-11-22 DIAGNOSIS — N210 Calculus in bladder: Secondary | ICD-10-CM | POA: Diagnosis not present

## 2014-11-22 DIAGNOSIS — N411 Chronic prostatitis: Secondary | ICD-10-CM | POA: Diagnosis not present

## 2014-11-22 DIAGNOSIS — E559 Vitamin D deficiency, unspecified: Secondary | ICD-10-CM | POA: Insufficient documentation

## 2014-11-22 DIAGNOSIS — N320 Bladder-neck obstruction: Secondary | ICD-10-CM | POA: Diagnosis not present

## 2014-11-22 DIAGNOSIS — E519 Thiamine deficiency, unspecified: Secondary | ICD-10-CM | POA: Insufficient documentation

## 2014-11-22 DIAGNOSIS — M6281 Muscle weakness (generalized): Secondary | ICD-10-CM | POA: Insufficient documentation

## 2014-11-22 DIAGNOSIS — N400 Enlarged prostate without lower urinary tract symptoms: Secondary | ICD-10-CM | POA: Diagnosis not present

## 2014-11-22 DIAGNOSIS — G9349 Other encephalopathy: Secondary | ICD-10-CM | POA: Insufficient documentation

## 2014-11-22 DIAGNOSIS — E538 Deficiency of other specified B group vitamins: Secondary | ICD-10-CM | POA: Insufficient documentation

## 2014-11-22 DIAGNOSIS — R310 Gross hematuria: Secondary | ICD-10-CM | POA: Insufficient documentation

## 2014-11-22 DIAGNOSIS — D649 Anemia, unspecified: Secondary | ICD-10-CM | POA: Insufficient documentation

## 2014-11-22 DIAGNOSIS — D61818 Other pancytopenia: Secondary | ICD-10-CM | POA: Insufficient documentation

## 2014-11-22 DIAGNOSIS — I739 Peripheral vascular disease, unspecified: Secondary | ICD-10-CM | POA: Insufficient documentation

## 2014-11-22 HISTORY — PX: CYSTOSCOPY WITH LITHOLAPAXY: SHX1425

## 2014-11-22 HISTORY — DX: Personal history of other infectious and parasitic diseases: Z86.19

## 2014-11-22 HISTORY — DX: Myoneural disorder, unspecified: G70.9

## 2014-11-22 HISTORY — DX: Disease of blood and blood-forming organs, unspecified: D75.9

## 2014-11-22 HISTORY — DX: Adult failure to thrive: R62.7

## 2014-11-22 HISTORY — DX: Peripheral vascular disease, unspecified: I73.9

## 2014-11-22 HISTORY — DX: Dysphagia, unspecified: R13.10

## 2014-11-22 HISTORY — PX: TRANSURETHRAL INCISION OF PROSTATE: SHX2573

## 2014-11-22 LAB — CBC
HEMATOCRIT: 40.8 % (ref 39.0–52.0)
HEMOGLOBIN: 12.8 g/dL — AB (ref 13.0–17.0)
MCH: 27.6 pg (ref 26.0–34.0)
MCHC: 31.4 g/dL (ref 30.0–36.0)
MCV: 88.1 fL (ref 78.0–100.0)
Platelets: 303 10*3/uL (ref 150–400)
RBC: 4.63 MIL/uL (ref 4.22–5.81)
RDW: 15.1 % (ref 11.5–15.5)
WBC: 8.1 10*3/uL (ref 4.0–10.5)

## 2014-11-22 LAB — BASIC METABOLIC PANEL
Anion gap: 8 (ref 5–15)
BUN: 25 mg/dL — AB (ref 6–23)
CO2: 27 mmol/L (ref 19–32)
Calcium: 10.2 mg/dL (ref 8.4–10.5)
Chloride: 106 mmol/L (ref 96–112)
Creatinine, Ser: 1.09 mg/dL (ref 0.50–1.35)
GFR calc Af Amer: 80 mL/min — ABNORMAL LOW (ref >90)
GFR calc non Af Amer: 69 mL/min — ABNORMAL LOW (ref >90)
Glucose, Bld: 141 mg/dL — ABNORMAL HIGH (ref 70–99)
POTASSIUM: 4.1 mmol/L (ref 3.5–5.1)
SODIUM: 141 mmol/L (ref 135–145)

## 2014-11-22 SURGERY — CYSTOSCOPY, WITH BLADDER CALCULUS LITHOLAPAXY
Anesthesia: General

## 2014-11-22 MED ORDER — PROMETHAZINE HCL 25 MG/ML IJ SOLN: 6.2500 mg | INTRAMUSCULAR | Status: DC | PRN

## 2014-11-22 MED ORDER — FENTANYL CITRATE 0.05 MG/ML IJ SOLN
INTRAMUSCULAR | Status: AC
  Filled 2014-11-22: qty 2

## 2014-11-22 MED ORDER — STERILE WATER FOR IRRIGATION IR SOLN
Status: DC | PRN
  Administered 2014-11-22: 15000 mL

## 2014-11-22 MED ORDER — ZOLPIDEM TARTRATE 5 MG PO TABS: 5.0000 mg | ORAL_TABLET | Freq: Every evening | ORAL | Status: DC | PRN

## 2014-11-22 MED ORDER — BELLADONNA ALKALOIDS-OPIUM 16.2-60 MG RE SUPP: 1.0000 | Freq: Four times a day (QID) | RECTAL | Status: DC | PRN

## 2014-11-22 MED ORDER — SULFAMETHOXAZOLE-TRIMETHOPRIM 800-160 MG PO TABS
1.0000 | ORAL_TABLET | Freq: Two times a day (BID) | ORAL | Status: DC
  Administered 2014-11-22 – 2014-11-23 (×2): 1 via ORAL
  Filled 2014-11-22 (×2): qty 1

## 2014-11-22 MED ORDER — ONDANSETRON HCL 4 MG/2ML IJ SOLN
INTRAMUSCULAR | Status: AC
  Filled 2014-11-22: qty 2

## 2014-11-22 MED ORDER — DEXAMETHASONE SODIUM PHOSPHATE 10 MG/ML IJ SOLN
INTRAMUSCULAR | Status: DC | PRN
  Administered 2014-11-22: 10 mg via INTRAVENOUS

## 2014-11-22 MED ORDER — FENTANYL CITRATE 0.05 MG/ML IJ SOLN
INTRAMUSCULAR | Status: AC
  Filled 2014-11-22: qty 5

## 2014-11-22 MED ORDER — VITAMIN B-1 100 MG PO TABS
100.0000 mg | ORAL_TABLET | Freq: Every day | ORAL | Status: DC
  Administered 2014-11-22 – 2014-11-23 (×2): 100 mg via ORAL
  Filled 2014-11-22 (×2): qty 1

## 2014-11-22 MED ORDER — GABAPENTIN 100 MG PO CAPS
100.0000 mg | ORAL_CAPSULE | Freq: Three times a day (TID) | ORAL | Status: DC
  Administered 2014-11-22 – 2014-11-23 (×2): 100 mg via ORAL
  Filled 2014-11-22 (×3): qty 1

## 2014-11-22 MED ORDER — FOLIC ACID 1 MG PO TABS
1.0000 mg | ORAL_TABLET | Freq: Every day | ORAL | Status: DC
  Administered 2014-11-22 – 2014-11-23 (×2): 1 mg via ORAL
  Filled 2014-11-22 (×2): qty 1

## 2014-11-22 MED ORDER — SODIUM CHLORIDE 0.9 % IR SOLN
Status: DC | PRN
  Administered 2014-11-22: 6000 mL via INTRAVESICAL

## 2014-11-22 MED ORDER — PHENYLEPHRINE HCL 10 MG/ML IJ SOLN
INTRAMUSCULAR | Status: DC | PRN
  Administered 2014-11-22 (×5): 80 ug via INTRAVENOUS

## 2014-11-22 MED ORDER — PANTOPRAZOLE SODIUM 40 MG PO TBEC
40.0000 mg | DELAYED_RELEASE_TABLET | Freq: Every day | ORAL | Status: DC
  Administered 2014-11-23: 40 mg via ORAL
  Filled 2014-11-22: qty 1

## 2014-11-22 MED ORDER — BELLADONNA ALKALOIDS-OPIUM 16.2-60 MG RE SUPP
RECTAL | Status: AC
  Filled 2014-11-22: qty 1

## 2014-11-22 MED ORDER — PROPOFOL 10 MG/ML IV BOLUS
INTRAVENOUS | Status: DC | PRN
  Administered 2014-11-22: 150 mg via INTRAVENOUS

## 2014-11-22 MED ORDER — ONDANSETRON HCL 4 MG/2ML IJ SOLN
INTRAMUSCULAR | Status: DC | PRN
  Administered 2014-11-22: 4 mg via INTRAVENOUS

## 2014-11-22 MED ORDER — DEXTROSE-NACL 5-0.45 % IV SOLN
INTRAVENOUS | Status: DC
  Administered 2014-11-22 – 2014-11-23 (×2): via INTRAVENOUS

## 2014-11-22 MED ORDER — DOCUSATE SODIUM 100 MG PO CAPS
100.0000 mg | ORAL_CAPSULE | Freq: Two times a day (BID) | ORAL | Status: DC
  Administered 2014-11-22 – 2014-11-23 (×2): 100 mg via ORAL
  Filled 2014-11-22 (×2): qty 1

## 2014-11-22 MED ORDER — ONDANSETRON HCL 4 MG/2ML IJ SOLN: 4.0000 mg | INTRAMUSCULAR | Status: DC | PRN

## 2014-11-22 MED ORDER — LIDOCAINE HCL 2 % EX GEL
CUTANEOUS | Status: AC
  Filled 2014-11-22: qty 10

## 2014-11-22 MED ORDER — FENTANYL CITRATE 0.05 MG/ML IJ SOLN: 25.0000 ug | INTRAMUSCULAR | Status: DC | PRN

## 2014-11-22 MED ORDER — HYDROCODONE-ACETAMINOPHEN 5-325 MG PO TABS: 1.0000 | ORAL_TABLET | Freq: Four times a day (QID) | ORAL | Status: DC | PRN

## 2014-11-22 MED ORDER — PROPOFOL 10 MG/ML IV BOLUS
INTRAVENOUS | Status: AC
  Filled 2014-11-22: qty 20

## 2014-11-22 MED ORDER — FENTANYL CITRATE 0.05 MG/ML IJ SOLN
INTRAMUSCULAR | Status: DC | PRN
  Administered 2014-11-22: 25 ug via INTRAVENOUS
  Administered 2014-11-22: 50 ug via INTRAVENOUS
  Administered 2014-11-22 (×4): 25 ug via INTRAVENOUS
  Administered 2014-11-22: 50 ug via INTRAVENOUS
  Administered 2014-11-22 (×2): 25 ug via INTRAVENOUS

## 2014-11-22 MED ORDER — LACTATED RINGERS IV SOLN
INTRAVENOUS | Status: DC
  Administered 2014-11-22: 14:00:00 via INTRAVENOUS
  Administered 2014-11-22: 1000 mL via INTRAVENOUS

## 2014-11-22 MED ORDER — PHENYLEPHRINE 40 MCG/ML (10ML) SYRINGE FOR IV PUSH (FOR BLOOD PRESSURE SUPPORT)
PREFILLED_SYRINGE | INTRAVENOUS | Status: AC
  Filled 2014-11-22: qty 10

## 2014-11-22 MED ORDER — DEXAMETHASONE SODIUM PHOSPHATE 10 MG/ML IJ SOLN
INTRAMUSCULAR | Status: AC
  Filled 2014-11-22: qty 1

## 2014-11-22 MED ORDER — PRENATAL MULTIVITAMIN CH
1.0000 | ORAL_TABLET | Freq: Every day | ORAL | Status: DC
  Administered 2014-11-23: 1 via ORAL
  Filled 2014-11-22: qty 1

## 2014-11-22 MED ORDER — TAMSULOSIN HCL 0.4 MG PO CAPS
0.4000 mg | ORAL_CAPSULE | Freq: Every day | ORAL | Status: DC
  Administered 2014-11-22 – 2014-11-23 (×2): 0.4 mg via ORAL
  Filled 2014-11-22 (×2): qty 1

## 2014-11-22 SURGICAL SUPPLY — 29 items
BAG URINE DRAINAGE (UROLOGICAL SUPPLIES) ×2 IMPLANT
BAG URO CATCHER STRL LF (DRAPE) ×2 IMPLANT
CARTRIDGE STONEBREAK CO2 KIDNE (ELECTROSURGICAL) ×4 IMPLANT
CATH FOLEY 3WAY 30CC 24FR (CATHETERS) ×1
CATH URTH STD 24FR FL 3W 2 (CATHETERS) ×1 IMPLANT
CLOTH BEACON ORANGE TIMEOUT ST (SAFETY) ×2 IMPLANT
DRAPE CAMERA CLOSED 9X96 (DRAPES) IMPLANT
ELECT REM PT RETURN 9FT ADLT (ELECTROSURGICAL) ×2
ELECTRODE REM PT RTRN 9FT ADLT (ELECTROSURGICAL) ×1 IMPLANT
EVACUATOR MICROVAS BLADDER (UROLOGICAL SUPPLIES) IMPLANT
FIBER LASER FLEXIVA 1000 (UROLOGICAL SUPPLIES) IMPLANT
FIBER LASER FLEXIVA 200 (UROLOGICAL SUPPLIES) IMPLANT
FIBER LASER FLEXIVA 365 (UROLOGICAL SUPPLIES) IMPLANT
FIBER LASER FLEXIVA 550 (UROLOGICAL SUPPLIES) IMPLANT
FIBER LASER TRAC TIP (UROLOGICAL SUPPLIES) IMPLANT
GLOVE BIOGEL M 8.0 STRL (GLOVE) ×2 IMPLANT
GOWN SPEC L3 XXLG W/TWL (GOWN DISPOSABLE) ×2 IMPLANT
GOWN STRL REUS W/ TWL XL LVL3 (GOWN DISPOSABLE) ×1 IMPLANT
GOWN STRL REUS W/TWL XL LVL3 (GOWN DISPOSABLE) ×3 IMPLANT
KIT ASPIRATION TUBING (SET/KITS/TRAYS/PACK) IMPLANT
MANIFOLD NEPTUNE II (INSTRUMENTS) ×2 IMPLANT
PACK CYSTO (CUSTOM PROCEDURE TRAY) ×2 IMPLANT
PROBE KIDNEY STONEBRKR 2.0X425 (ELECTROSURGICAL) ×2 IMPLANT
PROBE LITHOCLAST ULTRA 3.8X403 (UROLOGICAL SUPPLIES) ×2 IMPLANT
PROBE PNEUMATIC 1.6MM (ELECTROSURGICAL) ×2 IMPLANT
SHIELD EYE BINOCULAR (MISCELLANEOUS) IMPLANT
STONE CATCHER W/TUBE ADAPTER (UROLOGICAL SUPPLIES) ×2 IMPLANT
SYRINGE IRR TOOMEY STRL 70CC (SYRINGE) ×2 IMPLANT
TUBING CONNECTING 10 (TUBING) ×2 IMPLANT

## 2014-11-22 NOTE — Discharge Instructions (Signed)
Transurethral Resection of the Prostate ° °Care After ° °Refer to this sheet in the next few weeks. These discharge instructions provide you with general information on caring for yourself after you leave the hospital. Your caregiver may also give you specific instructions. Your treatment has been planned according to the most current medical practices available, but unavoidable complications sometimes occur. If you have any problems or questions after discharge, please call your caregiver. ° °HOME CARE INSTRUCTIONS  ° °Medications °· You may receive medicine for pain management. As your level of discomfort decreases, adjustments in your pain medicines may be made.  °· Take all medicines as directed.  °· You may be given a medicine (antibiotic) to kill germs following surgery. Finish all medicines. Let your caregiver know if you have any side effects or problems from the medicine.  °· If you are on aspirin, it would be best not to restart the aspirin until the blood in the urine clears °Hygiene °· You can take a shower after surgery.  °· You should not take a bath while you still have the urethral catheter. °Activity °· You will be encouraged to get out of bed as much as possible and increase your activity level as tolerated.  °· Spend the first week in and around your home. For 3 weeks, avoid the following:  °· Straining.  °· Running.  °· Strenuous work.  °· Walks longer than a few blocks.  °· Riding for extended periods.  °· Sexual relations.  °· Do not lift heavy objects (more than 20 pounds) for at least 1 month. When lifting, use your arms instead of your abdominal muscles.  °· You will be encouraged to walk as tolerated. Do not exert yourself. Increase your activity level slowly. Remember that it is important to keep moving after an operation of any type. This cuts down on the possibility of developing blood clots.  °· Your caregiver will tell you when you can resume driving and light housework. Discuss this  at your first office visit after discharge. °Diet °· No special diet is ordered after a TURP. However, if you are on a special diet for another medical problem, it should be continued.  °· Normal fluid intake is usually recommended.  °· Avoid alcohol and caffeinated drinks for 2 weeks. They irritate the bladder. Decaffeinated drinks are okay.  °· Avoid spicy foods.  °Bladder Function °· For the first 10 days, empty the bladder whenever you feel a definite desire. Do not try to hold the urine for long periods of time.  °· Urinating once or twice a night even after you are healed is not uncommon.  °· You may see some recurrence of blood in the urine after discharge from the hospital. This usually happens within 2 weeks after the procedure.If this occurs, force fluids again as you did in the hospital and reduce your activity.  °Bowel Function °· You may experience some constipation after surgery. This can be minimized by increasing fluids and fiber in your diet. Drink enough water and fluids to keep your urine clear or pale yellow.  °· A stool softener may be prescribed for use at home. Do not strain to move your bowels.  °· If you are requiring increased pain medicine, it is important that you take stool softeners to prevent constipation. This will help to promote proper healing by reducing the need to strain to move your bowels.  °Sexual Activity °· Semen movement in the opposite direction and into the bladder (  retrograde ejaculation) may occur. Since the semen passes into the bladder, cloudy urine can occur the first time you urinate after intercourse. Or, you may not have an ejaculation during erection. Ask your caregiver when you can resume sexual activity. Retrograde ejaculation and reduced semen discharge should not reduce one's pleasure of intercourse.  °Postoperative Visit °· Arrange the date and time of your after surgery visit with your caregiver.  °Return to Work °· After your recovery is complete, you will  be able to return to work and resume all activities. Your caregiver will inform you when you can return to work.  °Foley Catheter Care °A soft, flexible tube (Foley catheter) may have been placed in your bladder to drain urine and fluid. Follow these instructions: °Taking Care of the Catheter °· Keep the area where the catheter leaves your body clean.  °· Attach the catheter to the leg so there is no tension on the catheter.  °· Keep the drainage bag below the level of the bladder, but keep it OFF the floor.  °· Do not take long soaking baths. Your caregiver will give instructions about showering.  °· Wash your hands before touching ANYTHING related to the catheter or bag.  °· Using mild soap and warm water on a washcloth:  °· Clean the area closest to the catheter insertion site using a circular motion around the catheter.  °· Clean the catheter itself by wiping AWAY from the insertion site for several inches down the tube.  °· NEVER wipe upward as this could sweep bacteria up into the urethra (tube in your body that normally drains the bladder) and cause infection.  °· Place a small amount of sterile lubricant at the tip of the penis where the catheter is entering.  °Taking Care of the Drainage Bags °· Two drainage bags may be taken home: a large overnight drainage bag, and a smaller leg bag which fits underneath clothing.  °· It is okay to wear the overnight bag at any time, but NEVER wear the smaller leg bag at night.  °· Keep the drainage bag well below the level of your bladder. This prevents backflow of urine into the bladder and allows the urine to drain freely.  °· Anchor the tubing to your leg to prevent pulling or tension on the catheter. Use tape or a leg strap provided by the hospital.  °· Empty the drainage bag when it is 1/2 to 3/4 full. Wash your hands before and after touching the bag.  °· Periodically check the tubing for kinks to make sure there is no pressure on the tubing which could restrict  the flow of urine.  °Changing the Drainage Bags °· Cleanse both ends of the clean bag with alcohol before changing.  °· Pinch off the rubber catheter to avoid urine spillage during the disconnection.  °· Disconnect the dirty bag and connect the clean one.  °· Empty the dirty bag carefully to avoid a urine spill.  °· Attach the new bag to the leg with tape or a leg strap.  °Cleaning the Drainage Bags °· Whenever a drainage bag is disconnected, it must be cleaned quickly so it is ready for the next use.  °· Wash the bag in warm, soapy water.  °· Rinse the bag thoroughly with warm water.  °· Soak the bag for 30 minutes in a solution of white vinegar and water (1 cup vinegar to 1 quart warm water).  °· Rinse with warm water.  °SEEK MEDICAL   CARE IF:  °· You have chills or night sweats.  °· You are leaking around your catheter or have problems with your catheter. It is not uncommon to have sporadic leakage around your catheter as a result of bladder spasms. If the leakage stops, there is not much need for concern. If you are uncertain, call your caregiver.  °· You develop side effects that you think are coming from your medicines.  °SEEK IMMEDIATE MEDICAL CARE IF:  °· You are suddenly unable to urinate. Check to see if there are any kinks in the drainage tubing that may cause this. If you cannot find any kinks, call your caregiver immediately. This is an emergency.  °· You develop shortness of breath or chest pains.  °· Bleeding persists or clots develop in your urine.  °· You have a fever.  °· You develop pain in your back or over your lower belly (abdomen).  °· You develop pain or swelling in your legs.  °· Any problems you are having get worse rather than better.  °MAKE SURE YOU:  °· Understand these instructions.  °· Will watch your condition.  °· Will get help right away if you are not doing well or get worse.  °Document Released: 10/12/2005 Document Revised: 06/24/2011 Document Reviewed: 06/05/2009 °[employer]®  Patient Information ©2012 [employer].Transurethral Resection of the Prostate °Care After °Refer to this sheet in the next few weeks. These discharge instructions provide you with general information on caring for yourself after you leave the hospital. Your caregiver may also give you specific instructions. Your treatment has been planned according to the most current medical practices available, but unavoidable complications sometimes occur. If you have any problems or questions after discharge, please call your caregiver. °

## 2014-11-22 NOTE — Transfer of Care (Signed)
Immediate Anesthesia Transfer of Care Note  Patient: Edwin [name]  Procedure(s) Performed: Procedure(s): CYSTOSCOPY WITH LITHOLAPAXY (N/A) TRANSURETHRAL INCISION OF THE PROSTATE (TUIP) (N/A)  Patient Location: PACU  Anesthesia Type:General  Level of Consciousness: awake and patient cooperative  Airway & Oxygen Therapy: Patient Spontanous Breathing and Patient connected to face mask oxygen  Post-op Assessment: Report given to RN and Post -op Vital signs reviewed and stable  Post vital signs: Reviewed and stable  Last Vitals:  Filed Vitals:   11/22/14 0932  BP: 197/83  Pulse: 98  Temp: 36.4 C  Resp: 18    Complications: No apparent anesthesia complications

## 2014-11-22 NOTE — Op Note (Signed)
Preoperative diagnosis: A 33 mm bladder calculus in diverticulum, bladder outlet obstruction  Postoperative diagnosis: Same   Procedure: Cystolitholapaxy greater than 3 cm bladder calculus, TUIP    Surgeon: Bertram Millard. Jarry Manon, M.D.   Anesthesia: Gen.   Complications: None  Specimen(s): Prostate chips, to pathology. Stone fragments, to the lab  Drain(s): 22 [place] three-way Foley catheter to CBI  Indications: 64 year old man with recently diagnosed bladder diverticuli and bladder stones here for follow-up. He was recently admitted to the hospital with acute encephalopathy and underwent extensive workup, the end result of which was significant B1, B12 and vitamin D deficiency which were thought to be the primary etiologies for his encephalopathy, complicated by UTI. He was found to have a right lower extremity DVT (present on admission presumably) . Because of pancytopenia he was started on Lovenox without warfarin. 3 days into hospitalization developed gross hematuria which required total 3 units PRBC. This was thought to be acute on chronic secondary to Eliquis. Eliquis was stopped and IVC filter was placed.   We saw him in consultation and diagnosed him with bladder diverticuli and large bladder stone in right sided hutch diverticulum. Patient was seen by urology and underwent cystocopy. Hematuria thought secondary to large bladder stone.   He remains off of anticoagulation with IVC filter in place. Per Dr. Zigmund Daniel, his clot burden is improving off of anticoagulation and it is ok to hold anticoagulation until bladder stone management is complete. He has not been having any hematuria.   Technique and findings: The patient was identified in the holding area. He was taken to the operating room following administration of IV gentamicin. He was administered general endotracheal anesthetic. He is placed in the dorsolithotomy position. Genitalia and perineum were prepped and draped. He was  wearing. He has had his.  A 28 French resectoscope sheath was placed using a obturator. The cystoscope was passed through this used to identify the bladder. There were multiple large diverticula. On the right posterior bladder, fibrinous tissue covered the superior portion of a large bladder stone that was in a diverticulum. This was identified. The remaining bladder urothelium appeared normal, without evidence of bladder tumor or significant erythema. Ureteral orifices were normal in configuration but were somewhat patulous consistent with high intravesical pressures. I first used the [place], the 2 mm probe, to fragment the stone. The stone was very dense on the outside, but very soft and the inside and does not fragmented appropriately with this Stonebreaker. I then removed the resectoscope sheath, and placed a nephroscope transurethrally. Using the nephroscope, I used the [employer], and used the ultrasonic lithotrite to both fragment and aspirate the fragments. This worked quite well, although it was tedious to get all the small fragments. After extensive treatment, all fragments were aspirated from the large diverticula using the suction device. The remaining bladder was identified/inspected. A couple of other fragments were seen in smaller diverticula a and Jason to the larger one. All stones were then aspirated and removed. Small fragments of the stone were saved and will be sent for analysis.  The bladder neck was quite high posteriorly. The prostate was not significantly obstructed. Because of the high bladder neck, I used the cutting loop to incise the bladder neck at the 6:00 position, from the trigone down to the verumontanum, which was preserved. I also took several swipes at the 12:00 position. Resected areas were carefully coagulated, hemostasis was excellent.  The prostate chips were aspirated/irrigated and sent for permanent pathology. Inspection of  the resected prostate revealed  adequate hemostasis. There was no bleeding from the diverticulum, and no further fragments were seen. At this point, the scope was removed. I placed a 22 French Foley catheter, three-way, and filled the balloon with 30 mL of saline. This was hooked to CBI with saline.  The patient tolerated procedure well. His awakened and taken to the PACU in stable condition.

## 2014-11-22 NOTE — Anesthesia Procedure Notes (Addendum)
Procedure Name: LMA Insertion Date/Time: 11/22/2014 1:13 PM Performed by: Delphia GratesHANDLER, Merrissa Giacobbe Pre-anesthesia Checklist: Patient identified, Emergency Drugs available, Suction available and Patient being monitored Patient Re-evaluated:Patient Re-evaluated prior to inductionOxygen Delivery Method: Circle system utilized Preoxygenation: Pre-oxygenation with 100% oxygen Intubation Type: IV induction LMA: LMA inserted Tube size: 4.0 mm Number of attempts: 1 Placement Confirmation: positive ETCO2 and breath sounds checked- equal and bilateral Tube secured with: Tape (Very poor dentition) Dental Injury: Teeth and Oropharynx as per pre-operative assessment

## 2014-11-22 NOTE — Anesthesia Postprocedure Evaluation (Signed)
  Anesthesia Post-op Note  Patient: Edwin [name]  Procedure(s) Performed: Procedure(s): CYSTOSCOPY WITH LITHOLAPAXY (N/A) TRANSURETHRAL INCISION OF THE PROSTATE (TUIP) (N/A)  Patient Location: PACU  Anesthesia Type:General  Level of Consciousness: awake and alert   Airway and Oxygen Therapy: Patient Spontanous Breathing  Post-op Pain: none  Post-op Assessment: Post-op Vital signs reviewed  Post-op Vital Signs: Reviewed  Last Vitals:  Filed Vitals:   11/22/14 1607  BP: 149/83  Pulse: 85  Temp: 36.3 C  Resp: 16    Complications: No apparent anesthesia complications

## 2014-11-22 NOTE — Anesthesia Preprocedure Evaluation (Addendum)
Anesthesia Evaluation  Patient identified by MRN, date of birth, ID band Patient awake    Reviewed: Allergy & Precautions, NPO status , Patient's Chart, lab work & pertinent test results  Airway Mallampati: III  TM Distance: >3 FB Neck ROM: Limited    Dental   Pulmonary neg pulmonary ROS,          Cardiovascular + Peripheral Vascular Disease (s/p IVC filter)     Neuro/Psych negative neurological ROS     GI/Hepatic negative GI ROS, Neg liver ROS,   Endo/Other    Renal/GU Renal disease     Musculoskeletal negative musculoskeletal ROS (+)   Abdominal   Peds  Hematology  (+) anemia ,   Anesthesia Other Findings   Reproductive/Obstetrics                            Anesthesia Physical Anesthesia Plan  ASA: III  Anesthesia Plan: General   Post-op Pain Management:    Induction: Intravenous  Airway Management Planned: LMA  Additional Equipment:   Intra-op Plan:   Post-operative Plan: Extubation in OR  Informed Consent: I have reviewed the patients History and Physical, chart, labs and discussed the procedure including the risks, benefits and alternatives for the proposed anesthesia with the patient or authorized representative who has indicated his/her understanding and acceptance.     Plan Discussed with: CRNA  Anesthesia Plan Comments:         Anesthesia Quick Evaluation

## 2014-11-22 NOTE — H&P (Signed)
Urology History and Physical Exam  CC: Bladder stone, bladder outlet obstruction  HPI: 64 year old male presents for cystoscopy, cystolitholapaxy for management of a large bladder stone and a diverticulum as well as TUIP versus TURP. His history is found below.    64 year old man with recently diagnosed bladder diverticuli and bladder stones here for follow-up. He was recently admitted to the hospital with acute encephalopathy and underwent extensive workup, the end result of which was significant B1, B12 and vitamin D deficiency which were thought to be the primary etiologies for his encephalopathy, complicated by UTI. He was found to have a right lower extremity DVT (present on admission presumably) . Because of pancytopenia he was started on Lovenox without warfarin. 3 days into hospitalization developed gross hematuria which required total 3 units PRBC. This was thought to be acute on chronic secondary to Eliquis. Eliquis was stopped and IVC filter was placed.   We saw him in consultation and diagnosed him with bladder diverticuli and large bladder stone in right sided hutch diverticulum. Patient was seen by urology and underwent cystocopy. Hematuria thought secondary to large bladder stone.   He remains off of anticoagulation with IVC filter in place. Per Dr. Zigmund Daniel, his clot burden is improving off of anticoagulation and it is ok to hold anticoagulation until bladder stone management is complete. He has not been having any hematuria.  PMH: Past Medical History  Diagnosis Date  . Encephalopathy   . UTI (lower urinary tract infection)   . Renal disorder     Bladder stone,Bladder diverticulm  . Hx of sepsis 12/15  . Peripheral vascular disease 10/11/14    acute embolism and thrombosis Rt femoral vein  . Dysphagia 10/11/14  . Blood dyscrasia     pancytopenia  . Anemia     nutritional anemia  . Anemia     posthemorrhagic anemia  . Neuromuscular disorder     muscle weakness  .  Failure to thrive in adult 08/22/14    PSH: Past Surgical History  Procedure Laterality Date  . Cystoscopy N/A 08/21/2014    Procedure: CYSTOSCOPY FLEXIBLE;  Surgeon: Chelsea Aus, MD;  Location: [facility];  Service: Urology;  Laterality: N/A;  at bedside    Allergies: No Known Allergies  Medications: Prescriptions prior to admission  Medication Sig Dispense Refill Last Dose  . cholecalciferol (VITAMIN D) 1000 UNITS tablet Take 3,000 Units by mouth daily.    11/21/2014 at Unknown time  . clonazePAM (KLONOPIN) 0.5 MG tablet Take one tablet by mouth every night at bedtime for rest 30 tablet 5 11/20/2014  . cyanocobalamin (,VITAMIN B-12,) 1000 MCG/ML injection Inject 1 mL (1,000 mcg total) into the muscle once. Once every week for total four doses. First dose November 3rd 2015. (Patient taking differently: Inject 1,000 mcg into the muscle every 30 (thirty) days. On the 7th of every month)   11/01/2014  . doxycycline (VIBRA-TABS) 100 MG tablet Take 100 mg by mouth See admin instructions. Twice daily for 5 days then 1 tablet daily until surgery. Started 11/05/14.   11/21/2014 at 0900  . folic acid (FOLVITE) 1 MG tablet Take 1 tablet (1 mg total) by mouth daily.   11/21/2014 at Unknown time  . gabapentin (NEURONTIN) 100 MG capsule Take 1 capsule (100 mg total) by mouth 3 (three) times daily. 30 capsule 1 11/21/2014 at Unknown time  . pantoprazole (PROTONIX) 40 MG tablet Take 1 tablet (40 mg total) by mouth daily. 30 tablet 1 11/21/2014 at Unknown  time  . Prenatal Vit-Fe Fumarate-FA (PRENATAL MULTIVITAMIN) TABS tablet Take 1 tablet by mouth daily at 12 noon.   11/21/2014 at Unknown time  . tamsulosin (FLOMAX) 0.4 MG CAPS capsule Take 0.4 mg by mouth daily.   11/21/2014 at Unknown time  . thiamine 100 MG tablet Take 1 tablet (100 mg total) by mouth daily.   11/21/2014 at Unknown time  . HYDROcodone-acetaminophen (NORCO/VICODIN) 5-325 MG per tablet Take 1 tablet by mouth every 6 (six) hours as needed for  moderate pain. 20 tablet 0 Unknown at Unknown time  . sulfamethoxazole-trimethoprim (BACTRIM DS,SEPTRA DS) 800-160 MG per tablet Take 1 tablet by mouth every 12 (twelve) hours. (Patient not taking: Reported on 11/08/2014) 28 tablet 0      Social History: History   Social History  . Marital Status: Single    Spouse Name: N/A    Number of Children: N/A  . Years of Education: N/A   Occupational History  . Not on file.   Social History Main Topics  . Smoking status: Never Smoker   . Smokeless tobacco: Current User  . Alcohol Use: No  . Drug Use: No  . Sexual Activity: Not on file   Other Topics Concern  . Not on file   Social History Narrative    Family History: History reviewed. No pertinent family history.  Review of Systems: Positive: Slow stream, intermittency, hesitancy Negative:   A further 10 point review of systems was negative except what is listed in the HPI.                  Physical Exam: @VITALS2 @ General: No acute distress.  Awake. Head:  Normocephalic.  Atraumatic. ENT:  EOMI.  Mucous membranes moist. Poor dentition Neck:  Supple.  No lymphadenopathy. CV:  S1 present. S2 present. Regular rate. Pulmonary: Equal effort bilaterally.  Clear to auscultation bilaterally. Abdomen: Soft.  Non- tender to palpation. Skin:  Normal turgor.  No visible rash. Extremity: No gross deformity of bilateral upper extremities.  No gross deformity of                             lower extremities. Neurologic: Alert. Appropriate mood.  Penis:  Un circumcised.  No lesions. Urethra: Orthotopic meatus. Scrotum: No lesions.  No ecchymosis.  No erythema. Testicles: Descended bilaterally.  No masses bilaterally. Epididymis: Palpable bilaterally. Nontender to palpation.  Studies:  Recent Labs     11/22/14  0945  HGB  12.8*  WBC  8.1  PLT  303    No results for input(s): NA, K, CL, CO2, BUN, CREATININE, CALCIUM, GFRNONAA, GFRAA in the last 72 hours.  Invalid input(s):  MAGNESIUM   No results for input(s): INR, APTT in the last 72 hours.  Invalid input(s): PT   Invalid input(s): ABG    Assessment:  Bladder outlet obstruction secondary to BPH, large bladder stone in a posterior diverticulum  Plan: Cystoscopy, cystolitholapaxy of large bladder stone (greater than 3 cm), TUIP versus TURP

## 2014-11-23 ENCOUNTER — Encounter (HOSPITAL_COMMUNITY): Payer: Self-pay | Admitting: Urology

## 2014-11-23 DIAGNOSIS — R627 Adult failure to thrive: Secondary | ICD-10-CM | POA: Diagnosis not present

## 2014-11-23 DIAGNOSIS — G320 Subacute combined degeneration of spinal cord in diseases classified elsewhere: Secondary | ICD-10-CM | POA: Diagnosis not present

## 2014-11-23 DIAGNOSIS — N320 Bladder-neck obstruction: Secondary | ICD-10-CM | POA: Diagnosis not present

## 2014-11-23 DIAGNOSIS — D539 Nutritional anemia, unspecified: Secondary | ICD-10-CM | POA: Diagnosis not present

## 2014-11-23 DIAGNOSIS — N138 Other obstructive and reflux uropathy: Secondary | ICD-10-CM | POA: Diagnosis not present

## 2014-11-23 DIAGNOSIS — N411 Chronic prostatitis: Secondary | ICD-10-CM | POA: Diagnosis not present

## 2014-11-23 DIAGNOSIS — E46 Unspecified protein-calorie malnutrition: Secondary | ICD-10-CM | POA: Diagnosis not present

## 2014-11-23 DIAGNOSIS — E559 Vitamin D deficiency, unspecified: Secondary | ICD-10-CM | POA: Diagnosis not present

## 2014-11-23 DIAGNOSIS — E538 Deficiency of other specified B group vitamins: Secondary | ICD-10-CM | POA: Diagnosis not present

## 2014-11-23 DIAGNOSIS — R310 Gross hematuria: Secondary | ICD-10-CM | POA: Diagnosis not present

## 2014-11-23 DIAGNOSIS — N401 Enlarged prostate with lower urinary tract symptoms: Secondary | ICD-10-CM | POA: Diagnosis not present

## 2014-11-23 DIAGNOSIS — R1319 Other dysphagia: Secondary | ICD-10-CM | POA: Diagnosis not present

## 2014-11-23 DIAGNOSIS — D510 Vitamin B12 deficiency anemia due to intrinsic factor deficiency: Secondary | ICD-10-CM | POA: Diagnosis not present

## 2014-11-23 DIAGNOSIS — R262 Difficulty in walking, not elsewhere classified: Secondary | ICD-10-CM | POA: Diagnosis not present

## 2014-11-23 DIAGNOSIS — R509 Fever, unspecified: Secondary | ICD-10-CM | POA: Diagnosis not present

## 2014-11-23 DIAGNOSIS — N10 Acute tubulo-interstitial nephritis: Secondary | ICD-10-CM | POA: Diagnosis not present

## 2014-11-23 DIAGNOSIS — A419 Sepsis, unspecified organism: Secondary | ICD-10-CM | POA: Diagnosis not present

## 2014-11-23 DIAGNOSIS — N400 Enlarged prostate without lower urinary tract symptoms: Secondary | ICD-10-CM | POA: Diagnosis not present

## 2014-11-23 DIAGNOSIS — N323 Diverticulum of bladder: Secondary | ICD-10-CM | POA: Diagnosis not present

## 2014-11-23 DIAGNOSIS — N210 Calculus in bladder: Secondary | ICD-10-CM | POA: Diagnosis not present

## 2014-11-23 DIAGNOSIS — I82401 Acute embolism and thrombosis of unspecified deep veins of right lower extremity: Secondary | ICD-10-CM | POA: Diagnosis not present

## 2014-11-23 DIAGNOSIS — R279 Unspecified lack of coordination: Secondary | ICD-10-CM | POA: Diagnosis not present

## 2014-11-23 DIAGNOSIS — G9340 Encephalopathy, unspecified: Secondary | ICD-10-CM | POA: Diagnosis not present

## 2014-11-23 DIAGNOSIS — I82411 Acute embolism and thrombosis of right femoral vein: Secondary | ICD-10-CM | POA: Diagnosis not present

## 2014-11-23 DIAGNOSIS — R278 Other lack of coordination: Secondary | ICD-10-CM | POA: Diagnosis not present

## 2014-11-23 DIAGNOSIS — D62 Acute posthemorrhagic anemia: Secondary | ICD-10-CM | POA: Diagnosis not present

## 2014-11-23 DIAGNOSIS — D538 Other specified nutritional anemias: Secondary | ICD-10-CM | POA: Diagnosis not present

## 2014-11-23 DIAGNOSIS — Z95828 Presence of other vascular implants and grafts: Secondary | ICD-10-CM | POA: Diagnosis not present

## 2014-11-23 DIAGNOSIS — M6281 Muscle weakness (generalized): Secondary | ICD-10-CM | POA: Diagnosis not present

## 2014-11-23 DIAGNOSIS — D61818 Other pancytopenia: Secondary | ICD-10-CM | POA: Diagnosis not present

## 2014-11-23 DIAGNOSIS — R41841 Cognitive communication deficit: Secondary | ICD-10-CM | POA: Diagnosis not present

## 2014-11-23 LAB — URINE CULTURE: Colony Count: 2000

## 2014-11-23 MED ORDER — SULFAMETHOXAZOLE-TRIMETHOPRIM 800-160 MG PO TABS: 1.0000 | ORAL_TABLET | Freq: Two times a day (BID) | ORAL | Status: DC

## 2014-11-23 NOTE — Progress Notes (Signed)
UR completed 

## 2014-11-23 NOTE — Progress Notes (Signed)
CARE MANAGEMENT NOTE 11/23/2014  [name]   Account Number:  [id number]  Date Initiated:  11/23/2014  Documentation initiated by:  Lanier ClamMAHABIR,Danny Yackley  Subjective/Objective Assessment:   64 y/o m admitted w/Bladder calculus.     Action/Plan:   From SNF-[facility].   Anticipated DC Date:  11/23/2014   Anticipated DC Plan:  SKILLED NURSING FACILITY      DC Planning Services  CM consult      Choice offered to / List presented to:             Status of service:  Completed, signed off Medicare Important Message given?   (If response is "NO", the following Medicare IM given date fields will be blank) Date Medicare IM given:   Medicare IM given by:   Date Additional Medicare IM given:   Additional Medicare IM given by:    Discharge Disposition:  SKILLED NURSING FACILITY  Per UR Regulation:  Reviewed for med. necessity/level of care/duration of stay  If discussed at Long Length of Stay Meetings, dates discussed:    Comments:  11/23/14 Lanier ClamKathy Jaqua Ching RN BSN NCM [phone] D/C back to SNF.

## 2014-11-23 NOTE — Discharge Summary (Signed)
Patient ID: Edwin PewCalvin Boehle MRN: [id number] DOB/AGE: 66/03/1951 66 y.o.  Admit date: 11/22/2014 Discharge date: 11/23/2014  Primary Care Physician:  No primary care provider on file.  Discharge Diagnoses:   Present on Admission:  . Calculus in bladder  Bladder outlet obstructioin  Consults:  None    Discharge Medications:   Medication List    TAKE these medications        cholecalciferol 1000 UNITS tablet  Commonly known as:  VITAMIN D  Take 3,000 Units by mouth daily.     clonazePAM 0.5 MG tablet  Commonly known as:  KLONOPIN  Take one tablet by mouth every night at bedtime for rest     cyanocobalamin 1000 MCG/ML injection  Commonly known as:  (VITAMIN B-12)  Inject 1 mL (1,000 mcg total) into the muscle once. Once every week for total four doses. First dose November 3rd 2015.     doxycycline 100 MG tablet  Commonly known as:  VIBRA-TABS  Take 100 mg by mouth See admin instructions. Twice daily for 5 days then 1 tablet daily until surgery. Started 11/05/14.     folic acid 1 MG tablet  Commonly known as:  FOLVITE  Take 1 tablet (1 mg total) by mouth daily.     gabapentin 100 MG capsule  Commonly known as:  NEURONTIN  Take 1 capsule (100 mg total) by mouth 3 (three) times daily.     HYDROcodone-acetaminophen 5-325 MG per tablet  Commonly known as:  NORCO/VICODIN  Take 1 tablet by mouth every 6 (six) hours as needed for moderate pain.     pantoprazole 40 MG tablet  Commonly known as:  PROTONIX  Take 1 tablet (40 mg total) by mouth daily.     prenatal multivitamin Tabs tablet  Take 1 tablet by mouth daily at 12 noon.     sulfamethoxazole-trimethoprim 800-160 MG per tablet  Commonly known as:  BACTRIM DS,SEPTRA DS  Take 1 tablet by mouth every 12 (twelve) hours.     sulfamethoxazole-trimethoprim 800-160 MG per tablet  Commonly known as:  BACTRIM DS,SEPTRA DS  Take 1 tablet by mouth every 12 (twelve) hours.     tamsulosin 0.4 MG Caps capsule  Commonly known as:   FLOMAX  Take 0.4 mg by mouth daily.     thiamine 100 MG tablet  Take 1 tablet (100 mg total) by mouth daily.         Significant Diagnostic Studies:  No results found.  Brief H and P: For complete details please refer to admission H and P, but in brief the patient was admitted for management of a large symptomatic bladder calculus as well as bladder neck obstruction  Hospital Course:  Active Problems:   Calculus in bladder   Day of Discharge BP 117/70 mmHg  Pulse 76  Temp(Src) 97.9 F (36.6 C) (Oral)  Resp 18  Ht 5\' 7"  (1.702 m)  Wt 84.823 kg (187 lb)  BMI 29.28 kg/m2  SpO2 98%  Results for orders placed or performed during the hospital encounter of 11/22/14 (from the past 24 hour(s))  CBC     Status: Abnormal   Collection Time: 11/22/14  9:45 AM  Result Value Ref Range   WBC 8.1 4.0 - 10.5 K/uL   RBC 4.63 4.22 - 5.81 MIL/uL   Hemoglobin 12.8 (L) 13.0 - 17.0 g/dL   HCT 40.940.8 81.139.0 - 91.452.0 %   MCV 88.1 78.0 - 100.0 fL   MCH 27.6 26.0 - 34.0 pg  MCHC 31.4 30.0 - 36.0 g/dL   RDW 10.2 72.5 - 36.6 %   Platelets 303 150 - 400 K/uL  Basic metabolic panel     Status: Abnormal   Collection Time: 11/22/14  9:45 AM  Result Value Ref Range   Sodium 141 135 - 145 mmol/L   Potassium 4.1 3.5 - 5.1 mmol/L   Chloride 106 96 - 112 mmol/L   CO2 27 19 - 32 mmol/L   Glucose, Bld 141 (H) 70 - 99 mg/dL   BUN 25 (H) 6 - 23 mg/dL   Creatinine, Ser 4.40 0.50 - 1.35 mg/dL   Calcium 34.7 8.4 - 42.5 mg/dL   GFR calc non Af Amer 69 (L) >90 mL/min   GFR calc Af Amer 80 (L) >90 mL/min   Anion gap 8 5 - 15    Physical Exam: General: Alert and awake oriented x3 not in any acute distress. HEENT: anicteric sclera, pupils reactive to light and accommodation CVS: S1-S2 clear no murmur rubs or gallops Chest: clear to auscultation bilaterally, no wheezing rales or rhonchi Abdomen: soft nontender, nondistended, normal bowel sounds, no organomegaly Extremities: no cyanosis, clubbing or edema  noted bilaterally Neuro: Cranial nerves II-XII intact, no focal neurological deficits  Disposition:  To SNF  Diet:  No restrictions  Activity:  Ad lib   Disposition and Follow-up:     Discharge Instructions    Discharge patient    Complete by:  As directed             Followup in [place] offi ce  TESTS THAT NEED FOLLOW-UP  Pathology  DISCHARGE FOLLOW-UP Follow-up Information    Follow up with Chelsea Aus, MD.   Specialty:  Urology   Why:  as scheduled   Contact information:   [address] [place] [address] [phone]       Time spent on Discharge:  15 minutes  Signed: Chelsea Aus 11/23/2014, 7:27 AM

## 2014-11-26 ENCOUNTER — Other Ambulatory Visit: Payer: Self-pay | Admitting: *Deleted

## 2014-11-26 MED ORDER — HYDROCODONE-ACETAMINOPHEN 5-325 MG PO TABS: 1.0000 | ORAL_TABLET | Freq: Four times a day (QID) | ORAL | Status: DC | PRN

## 2014-11-26 MED ORDER — CLONAZEPAM 0.5 MG PO TABS: ORAL_TABLET | ORAL | Status: DC

## 2014-11-26 NOTE — Telephone Encounter (Signed)
[facility] 

## 2014-11-28 ENCOUNTER — Non-Acute Institutional Stay (SKILLED_NURSING_FACILITY): Payer: Medicare Other | Admitting: Internal Medicine

## 2014-11-28 DIAGNOSIS — N210 Calculus in bladder: Secondary | ICD-10-CM

## 2014-11-28 DIAGNOSIS — E538 Deficiency of other specified B group vitamins: Secondary | ICD-10-CM | POA: Diagnosis not present

## 2014-11-28 DIAGNOSIS — G320 Subacute combined degeneration of spinal cord in diseases classified elsewhere: Secondary | ICD-10-CM

## 2014-11-28 DIAGNOSIS — N10 Acute tubulo-interstitial nephritis: Secondary | ICD-10-CM

## 2014-12-01 NOTE — Progress Notes (Addendum)
Patient ID: Edwin [name], male   DOB: 06/22/1951, 64 y.o.   MRN: [id number]              PROGRESS NOTE  DATE:  11/28/2014                  FACILITY: [facility]       LEVEL OF CARE:   SNF   Routine Visit   CHIEF COMPLAINT:  Review of general medical issues.       HISTORY OF PRESENT ILLNESS:  Edwin [name] is a gentleman whom I admitted to the building in early November. At that point, he had spent a complex hospitalization at [facility], felt to be secondary to some form of metabolic encephalopathy.      He was discovered to have a vitamin B12 level of less than 10, macrocytic anemia, and profound vitamin D deficiency.      He was also discovered to have an extensive right leg DVT and was given anticoagulation. He then developed gross hematuria. Therefore, he had an IVC filter placed.      Ultimately, he was discovered to have a bladder stone causing the bleeding, and some degree of bladder outlet obstruction. He was followed by Dr. Retta Diones.        He recent underwent a short admission to hospital under Dr. Retta Diones for cystoscopy with litholapaxy and transurethral resection of the prostate. His pathology showed benign prostate tissue with fibromuscular hyperplasia, chronic inflammation, but no evidence of malignancy. Overlying reactive urothelium, chronic inflammation, no dysplasia.                The patient states he is doing fairly well.   Having urinary frequency, but no dysuria.   He does not have a Foley catheter any longer.    CURRENT MEDICATIONS:  Medication list is reviewed.     Protonix 40 mg a day.    Vitamin D3, 3000 U daily.     Vitamin B12, 1000 mcg monthly.     Folic acid 1 mg daily.      Flomax 0.4 q.d.      Vitamin B1, 100 mg daily.     Gabapentin 100 mg three times a day.    Klonopin 0.5 q.h.s.      LABORATORY DATA:  Lab word done in the hospital:                      Hemoglobin was 12.8, which is a continued improvement.    His basic metabolic panel was  normal.  BUN was 25, creatinine 1.09, sodium 141, potassium 4.1.     REVIEW OF SYSTEMS:   CHEST/RESPIRATORY:  The patient is not short of breath.   CARDIAC:   No chest pain.   GI:  No abdominal pain.  No diarrhea.    GU:  He is having urinary frequency and I think some incontinence.    PHYSICAL EXAMINATION:   GENERAL APPEARANCE:  The patient looks considerably better.   CHEST/RESPIRATORY:  Clear air entry bilaterally.    CARDIOVASCULAR:  CARDIAC:   Heart sounds are normal.   GASTROINTESTINAL:  LIVER/SPLEEN/KIDNEYS:  No liver, no spleen.   ABDOMEN:  No masses.    GENITOURINARY:  BLADDER:   Not distended.   NEUROLOGICAL:    BALANCE/GAIT:  He is able to bring himself to a standing position.  Very mildly wide-based gait, but steady.  Turns around normally.  No balance issues.    ASSESSMENT/PLAN:  Status post cystoscopy, removal of a bladder stone, and TURP.  He has urinary frequency.  I am assuming that at some point he may need bladder emptying studies, etc.    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  He has made a considerably improvement here.  No further lab work is necessary.    Profound vitamin D deficiency.  I think this can be remeasured.  He may no longer require the amount of supplement that he is getting.    I think his thiamine can be discontinued.

## 2014-12-12 ENCOUNTER — Other Ambulatory Visit (HOSPITAL_COMMUNITY): Payer: Self-pay

## 2014-12-12 DIAGNOSIS — D61818 Other pancytopenia: Secondary | ICD-10-CM

## 2014-12-13 ENCOUNTER — Encounter (HOSPITAL_COMMUNITY): Payer: Self-pay | Admitting: Hematology & Oncology

## 2014-12-13 ENCOUNTER — Encounter (HOSPITAL_COMMUNITY)
Payer: No Typology Code available for payment source | Attending: Hematology & Oncology | Admitting: Hematology & Oncology

## 2014-12-13 ENCOUNTER — Encounter (HOSPITAL_BASED_OUTPATIENT_CLINIC_OR_DEPARTMENT_OTHER): Payer: No Typology Code available for payment source

## 2014-12-13 VITALS — BP 148/73 | HR 91 | Temp 98.5°F | Resp 18 | Wt 195.1 lb

## 2014-12-13 DIAGNOSIS — D61818 Other pancytopenia: Secondary | ICD-10-CM | POA: Insufficient documentation

## 2014-12-13 DIAGNOSIS — E538 Deficiency of other specified B group vitamins: Secondary | ICD-10-CM | POA: Diagnosis not present

## 2014-12-13 DIAGNOSIS — I82411 Acute embolism and thrombosis of right femoral vein: Secondary | ICD-10-CM

## 2014-12-13 LAB — CBC WITH DIFFERENTIAL/PLATELET
BASOS ABS: 0 10*3/uL (ref 0.0–0.1)
Basophils Relative: 1 % (ref 0–1)
EOS ABS: 0.4 10*3/uL (ref 0.0–0.7)
Eosinophils Relative: 5 % (ref 0–5)
HCT: 37.7 % — ABNORMAL LOW (ref 39.0–52.0)
HEMOGLOBIN: 12.1 g/dL — AB (ref 13.0–17.0)
LYMPHS ABS: 2.4 10*3/uL (ref 0.7–4.0)
LYMPHS PCT: 28 % (ref 12–46)
MCH: 27.5 pg (ref 26.0–34.0)
MCHC: 32.1 g/dL (ref 30.0–36.0)
MCV: 85.7 fL (ref 78.0–100.0)
Monocytes Absolute: 0.6 10*3/uL (ref 0.1–1.0)
Monocytes Relative: 6 % (ref 3–12)
NEUTROS PCT: 60 % (ref 43–77)
Neutro Abs: 5.2 10*3/uL (ref 1.7–7.7)
PLATELETS: 308 10*3/uL (ref 150–400)
RBC: 4.4 MIL/uL (ref 4.22–5.81)
RDW: 15.4 % (ref 11.5–15.5)
WBC: 8.6 10*3/uL (ref 4.0–10.5)

## 2014-12-13 NOTE — Progress Notes (Signed)
[name]'s reason for visit today is for labs as scheduled per MD orders.  Venipuncture performed with a 23 gauge butterfly needle to L Antecubital.  Edwin [name] tolerated procedure well and without incident; questions were answered and patient was discharged.

## 2014-12-13 NOTE — Progress Notes (Signed)
Labs drawn

## 2014-12-13 NOTE — Patient Instructions (Signed)
[facility] at [facility]  Discharge Instructions:  You saw Dr Galen Manilapenland today.  We added a B12 level to your blood your today.  Follow up in 6 months with the doctor and lab work. _______________________________________________________________  Thank you for choosing [facility] at [facility] to provide your oncology and hematology care.  To afford each patient quality time with our providers, please arrive at least 15 minutes before your scheduled appointment.  You need to re-schedule your appointment if you arrive 10 or more minutes late.  We strive to give you quality time with our providers, and arriving late affects you and other patients whose appointments are after yours.  Also, if you no show three or more times for appointments you may be dismissed from the clinic.  Again, thank you for choosing [facility] at [facility]. Our hope is that these requests will allow you access to exceptional care and in a timely manner. _______________________________________________________________  If you have questions after your visit, please contact our office at [phone] between the hours of 8:30 a.m. and 5:00 p.m. Voicemails left after 4:30 p.m. will not be returned until the following business day. _______________________________________________________________  For prescription refill requests, have your pharmacy contact our office. _______________________________________________________________  Recommendations made by the consultant and any test results will be sent to your referring physician. _______________________________________________________________

## 2014-12-14 LAB — VITAMIN B12: VITAMIN B 12: 1005 pg/mL — AB (ref 211–911)

## 2014-12-21 ENCOUNTER — Other Ambulatory Visit: Payer: Self-pay | Admitting: *Deleted

## 2014-12-21 MED ORDER — CLONAZEPAM 0.5 MG PO TABS: ORAL_TABLET | ORAL | Status: DC

## 2014-12-21 NOTE — Telephone Encounter (Signed)
[facility] # [phone] Fax: [phone] 

## 2014-12-25 ENCOUNTER — Ambulatory Visit (INDEPENDENT_AMBULATORY_CARE_PROVIDER_SITE_OTHER): Payer: Self-pay | Admitting: Urology

## 2014-12-25 DIAGNOSIS — N320 Bladder-neck obstruction: Secondary | ICD-10-CM

## 2014-12-25 DIAGNOSIS — N210 Calculus in bladder: Secondary | ICD-10-CM

## 2014-12-26 ENCOUNTER — Non-Acute Institutional Stay (SKILLED_NURSING_FACILITY): Payer: Medicare Other | Admitting: Internal Medicine

## 2014-12-26 DIAGNOSIS — I82401 Acute embolism and thrombosis of unspecified deep veins of right lower extremity: Secondary | ICD-10-CM

## 2014-12-26 DIAGNOSIS — E538 Deficiency of other specified B group vitamins: Secondary | ICD-10-CM | POA: Diagnosis not present

## 2014-12-26 DIAGNOSIS — G320 Subacute combined degeneration of spinal cord in diseases classified elsewhere: Secondary | ICD-10-CM

## 2015-01-01 NOTE — Progress Notes (Addendum)
Patient ID: Edwin [name], male   DOB: 05/29/1951, 64 y.o.   MRN: [id number]              PROGRESS NOTE  DATE:  12/26/2014            [facility]      LEVEL OF CARE:   SNF   Acute Visit   HISTORY OF PRESENT ILLNESS:  [name] is a gentleman who came to us initially after a reasonably complex hospitalization.  He was found to have severe encephalopathy, which was multifactorial.    He was also found to have a vitamin B12 level of less than 10, macrocytic anemia, and a profound vitamin D deficiency.  Also felt he might have subacute combined degeneration of the cord.     He was also discovered to have an extensive right leg DVT and was given anticoagulation.  He then developed gross hematuria.  Therefore, he had an IVC filter placed.     He was subsequently taken back to the hospital for a urologic removal of a large bladder calculus, as well as BPH, by Urology.  All of this seems to have gone nicely.  He has follow-up appointments, I think, with Dr. Retta Dionesahlstedt in three months.  I do not believe that he is planning any further interventions.    All of the admissions were in October 2015, I think.    He has recently been back to see Hematology.  Vitamin B12 level was repeated at 1005.  I am not completely sure why that was necessary, but his hemoglobin is 12.1.  His indices have normalized.     PHYSICAL EXAMINATION:                    CHEST/RESPIRATORY:  Clear air entry bilaterally.           CARDIOVASCULAR:  CARDIAC:   Heart sounds are normal.  There are no murmurs.   EDEMA/VARICOSITIES:  Extremities:  No edema.  No evidence of a DVT.   SKIN:  INSPECTION:  I note that he has probably seborrheic dermatitis.  Some of these patches look thick enough to be scalp psoriasis.  I am going to start him on a Selsun-based shampoo.    ASSESSMENT/PLAN:                 Right leg DVT.  I think the filter can probably be removed after which he can be started on xarelto for 2-3  months  Seborrheic dermatitis.    Profound vitamin B12 deficiency with a broad-based, ataxic gait when he came in.  He has made some improvement.  His gait is still slightly unsteady, still slightly wide-based.  It is possible that this is going to be a permanent deficit, but this is considerably better and functional.

## 2015-01-07 NOTE — Progress Notes (Signed)
No primary care provider on file. No primary provider on file.    DIAGNOSIS:Thrombosis of R femoral vein, October 2015            Pernicious anemia, B12 less than 10             Profound Vitamin D deficiency            IVC filter placement secondary to hematuria on Eliquis, bladder stones            Thrombophilia evaluation negative, with noted abnormalites felt to be                                      secondary to acquisition of studies during acute setting  CURRENT THERAPY: Observation  INTERVAL HISTORY: Edwin [name] 64 y.o. male returns for  follow-up of severe B12 deficiency, and thrombosis of the right femoral vein. Per available medical records he developed significant hematuria while on eliquis, anticoagulation was stopped and an IVC filter was placed. Secondary to his profound B12 deficiency he had pancytopenia and also neurologic deficits.  Today he has no major complaints. There is discussion of removing his IVC filter.  MEDICAL HISTORY: Past Medical History  Diagnosis Date  . Encephalopathy   . UTI (lower urinary tract infection)   . Renal disorder     Bladder stone,Bladder diverticulm  . Hx of sepsis 12/15  . Peripheral vascular disease 10/11/14    acute embolism and thrombosis Rt femoral vein  . Dysphagia 10/11/14  . Blood dyscrasia     pancytopenia  . Anemia     nutritional anemia  . Anemia     posthemorrhagic anemia  . Neuromuscular disorder     muscle weakness  . Failure to thrive in adult 08/22/14    has Acute encephalopathy; Adult failure to thrive; UTI (lower urinary tract infection); Pancytopenia; Severe protein-calorie malnutrition; Vitamin B12 deficiency; Right leg DVT; Megaloblastic anemia due to B12 deficiency; Vitamin D deficiency; Malnutrition of moderate degree; Hematuria; Encephalopathy; Fever; Sepsis; S/P IVC filter; Bladder calculus; and Calculus in bladder on his problem list.     has No Known Allergies.  [name] had no  medications administered during this visit.  SURGICAL HISTORY: Past Surgical History  Procedure Laterality Date  . Cystoscopy N/A 08/21/2014    Procedure: CYSTOSCOPY FLEXIBLE;  Surgeon: Chelsea Aus, MD;  Location: [facility];  Service: Urology;  Laterality: N/A;  at bedside  . Cystoscopy with litholapaxy N/A 11/22/2014    Procedure: CYSTOSCOPY WITH LITHOLAPAXY;  Surgeon: Chelsea Aus, MD;  Location: [facility];  Service: Urology;  Laterality: N/A;  . Transurethral incision of prostate N/A 11/22/2014    Procedure: TRANSURETHRAL INCISION OF THE PROSTATE (TUIP);  Surgeon: Chelsea Aus, MD;  Location: [facility];  Service: Urology;  Laterality: N/A;    SOCIAL HISTORY: History   Social History  . Marital Status: Single    Spouse Name: N/A  . Number of Children: N/A  . Years of Education: N/A   Occupational History  . Not on file.   Social History Main Topics  . Smoking status: Never Smoker   . Smokeless tobacco: Current User  . Alcohol Use: No  . Drug Use: No  . Sexual Activity: Not on file   Other Topics Concern  . Not on file   Social History Narrative    FAMILY HISTORY: History reviewed. No  pertinent family history.  Review of Systems  Constitutional: Negative for fever, chills, weight loss and malaise/fatigue.  HENT: Negative for congestion, hearing loss, nosebleeds, sore throat and tinnitus.   Eyes: Negative for blurred vision, double vision, pain and discharge.  Respiratory: Negative for cough, hemoptysis, sputum production, shortness of breath and wheezing.   Cardiovascular: Negative for chest pain, palpitations, claudication, leg swelling and PND.  Gastrointestinal: Negative for heartburn, nausea, vomiting, abdominal pain, diarrhea, constipation, blood in stool and melena.  Genitourinary: Negative for dysuria, urgency, frequency and hematuria.  Musculoskeletal: Negative for myalgias, joint pain and falls.  Skin: Negative for itching and rash.    Neurological: Positive for dizziness and tingling. Negative for tremors, sensory change, speech change, focal weakness, seizures, loss of consciousness, weakness and headaches.  Endo/Heme/Allergies: Does not bruise/bleed easily.  Psychiatric/Behavioral: Negative for depression, suicidal ideas, memory loss and substance abuse. The patient is not nervous/anxious and does not have insomnia.     PHYSICAL EXAMINATION  ECOG PERFORMANCE STATUS: 2 - Symptomatic, <50% confined to bed  Filed Vitals:   12/13/14 1300  BP: 148/73  Pulse: 91  Temp: 98.5 F (36.9 C)  Resp: 18    Physical Exam  Constitutional: He is oriented to person, place, and time and well-developed, well-nourished, and in no distress.  HENT:  Head: Normocephalic and atraumatic.  Nose: Nose normal.  Mouth/Throat: Oropharynx is clear and moist. No oropharyngeal exudate.  Eyes: Conjunctivae and EOM are normal. Pupils are equal, round, and reactive to light. Right eye exhibits no discharge. Left eye exhibits no discharge. No scleral icterus.  Neck: Normal range of motion. Neck supple. No tracheal deviation present. No thyromegaly present.  Cardiovascular: Normal rate, regular rhythm and normal heart sounds.  Exam reveals no gallop and no friction rub.   No murmur heard. Pulmonary/Chest: Effort normal and breath sounds normal. He has no wheezes. He has no rales.  Abdominal: Soft. Bowel sounds are normal. He exhibits no distension and no mass. There is no tenderness. There is no rebound and no guarding.  Musculoskeletal: Normal range of motion. He exhibits no edema.  Lymphadenopathy:    He has no cervical adenopathy.  Neurological: He is alert and oriented to person, place, and time. He has normal reflexes. No cranial nerve deficit. Coordination abnormal.  Skin: Skin is warm and dry. No rash noted.  Psychiatric: Mood, memory, affect and judgment normal.  Nursing note and vitals reviewed.   LABORATORY DATA:  CBC     Component Value Date/Time   WBC 8.6 12/13/2014 1319   RBC 4.40 12/13/2014 1319   RBC 2.02* 08/14/2014 0041   HGB 12.1* 12/13/2014 1319   HCT 37.7* 12/13/2014 1319   PLT 308 12/13/2014 1319   MCV 85.7 12/13/2014 1319   MCH 27.5 12/13/2014 1319   MCHC 32.1 12/13/2014 1319   RDW 15.4 12/13/2014 1319   LYMPHSABS 2.4 12/13/2014 1319   MONOABS 0.6 12/13/2014 1319   EOSABS 0.4 12/13/2014 1319   BASOSABS 0.0 12/13/2014 1319   CMP     Component Value Date/Time   NA 141 11/22/2014 0945   K 4.1 11/22/2014 0945   CL 106 11/22/2014 0945   CO2 27 11/22/2014 0945   GLUCOSE 141* 11/22/2014 0945   BUN 25* 11/22/2014 0945   CREATININE 1.09 11/22/2014 0945   CALCIUM 10.2 11/22/2014 0945   PROT 8.1 10/05/2014 1940   ALBUMIN 3.8 10/05/2014 1940   AST 26 10/05/2014 1940   ALT 29 10/05/2014 1940   ALKPHOS 106 10/05/2014  1940   BILITOT 0.8 10/05/2014 1940   GFRNONAA 69* 11/22/2014 0945   GFRAA 80* 11/22/2014 0945      ASSESSMENT and THERAPY PLAN:   B12 deficiency  He is to continue with B12 replacement. Blood counts have improved. He still has a mild anemia but at this point do not feel further evaluation is needed. If he develops a significant change in his counts moving forward we will pursue additional workup.  Lower extremity DVT  Once his urologic evaluation and therapy is complete I certainly agree with removal of his IVC filter. He can then be restarted on appropriate anticoagulation. I would anticoagulate him for at least 6 months. I am not sure that longer duration anticoagulation would be warranted.  All questions were answered. The patient knows to call the clinic with any problems, questions or concerns. We can certainly see the patient much sooner if necessary. This note was electronically signed. Arvil Chacoenland,Porsche Noguchi Kristen 01/07/2015

## 2015-01-27 DIAGNOSIS — R41841 Cognitive communication deficit: Secondary | ICD-10-CM | POA: Diagnosis not present

## 2015-01-27 DIAGNOSIS — N400 Enlarged prostate without lower urinary tract symptoms: Secondary | ICD-10-CM | POA: Diagnosis not present

## 2015-01-27 DIAGNOSIS — E46 Unspecified protein-calorie malnutrition: Secondary | ICD-10-CM | POA: Diagnosis not present

## 2015-01-27 DIAGNOSIS — F419 Anxiety disorder, unspecified: Secondary | ICD-10-CM | POA: Diagnosis not present

## 2015-01-27 DIAGNOSIS — Z86718 Personal history of other venous thrombosis and embolism: Secondary | ICD-10-CM | POA: Diagnosis not present

## 2015-01-27 DIAGNOSIS — D510 Vitamin B12 deficiency anemia due to intrinsic factor deficiency: Secondary | ICD-10-CM | POA: Diagnosis not present

## 2015-01-28 ENCOUNTER — Telehealth: Payer: Self-pay | Admitting: Family Medicine

## 2015-01-28 NOTE — Telephone Encounter (Signed)
Patient family is going to bring a list of his medications for us to review

## 2015-01-29 ENCOUNTER — Telehealth: Payer: Self-pay | Admitting: Family Medicine

## 2015-01-29 DIAGNOSIS — N400 Enlarged prostate without lower urinary tract symptoms: Secondary | ICD-10-CM | POA: Diagnosis not present

## 2015-01-29 DIAGNOSIS — D510 Vitamin B12 deficiency anemia due to intrinsic factor deficiency: Secondary | ICD-10-CM | POA: Diagnosis not present

## 2015-01-29 DIAGNOSIS — R41841 Cognitive communication deficit: Secondary | ICD-10-CM | POA: Diagnosis not present

## 2015-01-29 DIAGNOSIS — F419 Anxiety disorder, unspecified: Secondary | ICD-10-CM | POA: Diagnosis not present

## 2015-01-29 DIAGNOSIS — E46 Unspecified protein-calorie malnutrition: Secondary | ICD-10-CM | POA: Diagnosis not present

## 2015-01-29 DIAGNOSIS — Z86718 Personal history of other venous thrombosis and embolism: Secondary | ICD-10-CM | POA: Diagnosis not present

## 2015-01-29 NOTE — Telephone Encounter (Addendum)
Patient is on vitamin d3, protonix, Flomax, neurontin, klonopin, selsum blue shampoo. Appointment given for Thursday with Tiffany @10 :00am. Patient son aware to bring insurance card and all medications.

## 2015-01-30 DIAGNOSIS — E46 Unspecified protein-calorie malnutrition: Secondary | ICD-10-CM | POA: Diagnosis not present

## 2015-01-30 DIAGNOSIS — F419 Anxiety disorder, unspecified: Secondary | ICD-10-CM | POA: Diagnosis not present

## 2015-01-30 DIAGNOSIS — N400 Enlarged prostate without lower urinary tract symptoms: Secondary | ICD-10-CM | POA: Diagnosis not present

## 2015-01-30 DIAGNOSIS — Z86718 Personal history of other venous thrombosis and embolism: Secondary | ICD-10-CM | POA: Diagnosis not present

## 2015-01-30 DIAGNOSIS — R41841 Cognitive communication deficit: Secondary | ICD-10-CM | POA: Diagnosis not present

## 2015-01-30 DIAGNOSIS — D510 Vitamin B12 deficiency anemia due to intrinsic factor deficiency: Secondary | ICD-10-CM | POA: Diagnosis not present

## 2015-01-31 ENCOUNTER — Ambulatory Visit (INDEPENDENT_AMBULATORY_CARE_PROVIDER_SITE_OTHER): Payer: Medicare Other | Admitting: Physician Assistant

## 2015-01-31 ENCOUNTER — Encounter: Payer: Self-pay | Admitting: Physician Assistant

## 2015-01-31 ENCOUNTER — Encounter (INDEPENDENT_AMBULATORY_CARE_PROVIDER_SITE_OTHER): Payer: Self-pay

## 2015-01-31 VITALS — BP 190/107 | HR 102 | Temp 98.0°F | Ht 67.0 in | Wt 207.0 lb

## 2015-01-31 DIAGNOSIS — E559 Vitamin D deficiency, unspecified: Secondary | ICD-10-CM | POA: Diagnosis not present

## 2015-01-31 DIAGNOSIS — I10 Essential (primary) hypertension: Secondary | ICD-10-CM

## 2015-01-31 DIAGNOSIS — M792 Neuralgia and neuritis, unspecified: Secondary | ICD-10-CM

## 2015-01-31 DIAGNOSIS — F411 Generalized anxiety disorder: Secondary | ICD-10-CM | POA: Diagnosis not present

## 2015-01-31 DIAGNOSIS — N400 Enlarged prostate without lower urinary tract symptoms: Secondary | ICD-10-CM

## 2015-01-31 DIAGNOSIS — E46 Unspecified protein-calorie malnutrition: Secondary | ICD-10-CM

## 2015-01-31 DIAGNOSIS — Z13220 Encounter for screening for lipoid disorders: Secondary | ICD-10-CM | POA: Insufficient documentation

## 2015-01-31 HISTORY — DX: Essential (primary) hypertension: I10

## 2015-01-31 HISTORY — DX: Benign prostatic hyperplasia without lower urinary tract symptoms: N40.0

## 2015-01-31 NOTE — Progress Notes (Signed)
   Subjective:    Patient ID: Edwin [name], male    DOB: 12/21/1950, 64 y.o.   MRN: [id number]  HPI 64 y/o male presents for establishment of care. [facility] He is unsure of health history, however, medications indicate h/o D3 deficiency, anxiety, peripheral neuropathy, GERD, BPH. He has not taken any of his medications today or recently.     Review of Systems  Constitutional:       Poor historian. Accompanied by son who is also a poor historian. According to patient he is asymptomatic on all questions asked.   All other systems reviewed and are negative.      Objective:   Physical Exam  Constitutional: He appears well-developed and well-nourished. No distress.  HENT:  Head: Normocephalic.  Cardiovascular:  Hypertensive, mild tachycardia  Pulmonary/Chest: Effort normal. No respiratory distress. He has no wheezes. He has no rales. He exhibits no tenderness.  Neurological: Coordination normal.  Alert, however not oriented to PPT. Poor historian  Skin: Skin is dry.  Nursing note and vitals reviewed.         Assessment & Plan:  1. Cystitis: Cipro 500mg  BID x 10 days. Recheck in 2 weeks   2. HTN: Refilled Lisinopril/HCTZ 10/12.5 q day. Recheck CBC in 2 weeks.   3. Peripheral Neuropathy: Refilled Gabapentin 100mg  TID  4. Vit D Deficiency: Have patient increase to 2 pills 3000units daily   5. FOBT positive: Recheck in 2 weeks at follow up.   6. BPH: Refills given for Flomax . PSA wnl  7. GERD: Refills given for Protonix  8. Follic Acid Def: Check levels at f/u in 2 weeks.

## 2015-02-01 ENCOUNTER — Encounter (HOSPITAL_COMMUNITY): Payer: Self-pay | Admitting: Hematology & Oncology

## 2015-02-02 DIAGNOSIS — R41841 Cognitive communication deficit: Secondary | ICD-10-CM | POA: Diagnosis not present

## 2015-02-02 DIAGNOSIS — N400 Enlarged prostate without lower urinary tract symptoms: Secondary | ICD-10-CM | POA: Diagnosis not present

## 2015-02-02 DIAGNOSIS — Z86718 Personal history of other venous thrombosis and embolism: Secondary | ICD-10-CM | POA: Diagnosis not present

## 2015-02-02 DIAGNOSIS — D510 Vitamin B12 deficiency anemia due to intrinsic factor deficiency: Secondary | ICD-10-CM | POA: Diagnosis not present

## 2015-02-02 DIAGNOSIS — E46 Unspecified protein-calorie malnutrition: Secondary | ICD-10-CM | POA: Diagnosis not present

## 2015-02-02 DIAGNOSIS — F419 Anxiety disorder, unspecified: Secondary | ICD-10-CM | POA: Diagnosis not present

## 2015-02-04 ENCOUNTER — Other Ambulatory Visit (INDEPENDENT_AMBULATORY_CARE_PROVIDER_SITE_OTHER): Payer: Medicare Other

## 2015-02-04 DIAGNOSIS — E46 Unspecified protein-calorie malnutrition: Secondary | ICD-10-CM | POA: Diagnosis not present

## 2015-02-04 DIAGNOSIS — N400 Enlarged prostate without lower urinary tract symptoms: Secondary | ICD-10-CM

## 2015-02-04 DIAGNOSIS — I10 Essential (primary) hypertension: Secondary | ICD-10-CM | POA: Diagnosis not present

## 2015-02-04 DIAGNOSIS — R300 Dysuria: Secondary | ICD-10-CM

## 2015-02-04 DIAGNOSIS — Z1212 Encounter for screening for malignant neoplasm of rectum: Secondary | ICD-10-CM

## 2015-02-04 DIAGNOSIS — E559 Vitamin D deficiency, unspecified: Secondary | ICD-10-CM | POA: Diagnosis not present

## 2015-02-04 DIAGNOSIS — M792 Neuralgia and neuritis, unspecified: Secondary | ICD-10-CM

## 2015-02-04 LAB — POCT CBC
GRANULOCYTE PERCENT: 68.2 % (ref 37–80)
HCT, POC: 43.4 % — AB (ref 43.5–53.7)
Hemoglobin: 13.2 g/dL — AB (ref 14.1–18.1)
Lymph, poc: 2.6 (ref 0.6–3.4)
MCH, POC: 25.7 pg — AB (ref 27–31.2)
MCHC: 30.5 g/dL — AB (ref 31.8–35.4)
MCV: 84.4 fL (ref 80–97)
MPV: 11.5 fL (ref 0–99.8)
PLATELET COUNT, POC: 324 10*3/uL (ref 142–424)
POC Granulocyte: 6.2 (ref 2–6.9)
POC LYMPH PERCENT: 28.3 %L (ref 10–50)
RBC: 5.14 M/uL (ref 4.69–6.13)
RDW, POC: 17.8 %
WBC: 9.1 10*3/uL (ref 4.6–10.2)

## 2015-02-04 LAB — POCT UA - MICROSCOPIC ONLY
CASTS, UR, LPF, POC: NEGATIVE
Crystals, Ur, HPF, POC: NEGATIVE
MUCUS UA: NEGATIVE
Yeast, UA: NEGATIVE

## 2015-02-04 LAB — POCT URINALYSIS DIPSTICK
Bilirubin, UA: NEGATIVE
GLUCOSE UA: NEGATIVE
Ketones, UA: NEGATIVE
Nitrite, UA: NEGATIVE
PH UA: 8
Spec Grav, UA: <=1.005
UROBILINOGEN UA: NEGATIVE

## 2015-02-04 MED ORDER — GABAPENTIN 100 MG PO CAPS: 100.0000 mg | ORAL_CAPSULE | Freq: Three times a day (TID) | ORAL | Status: DC

## 2015-02-04 MED ORDER — PANTOPRAZOLE SODIUM 40 MG PO TBEC: 40.0000 mg | DELAYED_RELEASE_TABLET | Freq: Every day | ORAL | Status: DC

## 2015-02-04 MED ORDER — LISINOPRIL-HYDROCHLOROTHIAZIDE 10-12.5 MG PO TABS: 1.0000 | ORAL_TABLET | Freq: Every day | ORAL | Status: DC

## 2015-02-04 MED ORDER — CLONAZEPAM 0.5 MG PO TABS: 0.5000 mg | ORAL_TABLET | Freq: Every day | ORAL | Status: DC

## 2015-02-04 MED ORDER — TAMSULOSIN HCL 0.4 MG PO CAPS: 0.4000 mg | ORAL_CAPSULE | Freq: Every day | ORAL | Status: DC

## 2015-02-04 MED ORDER — VITAMIN D3 75 MCG (3000 UT) PO TABS: 1.0000 | ORAL_TABLET | Freq: Every day | ORAL | Status: DC

## 2015-02-04 MED ORDER — PRENATAL 27-0.8 MG PO TABS: 1.0000 | ORAL_TABLET | Freq: Every day | ORAL | Status: DC

## 2015-02-04 NOTE — Addendum Note (Signed)
Addended by: Prescott GumLAND, Cyera Balboni M on: 02/04/2015 03:00 PM   Modules accepted: Orders

## 2015-02-04 NOTE — Addendum Note (Signed)
Addended by: Tommas OlpHANDY, [place] N on: 02/04/2015 02:46 PM   Modules accepted: Orders

## 2015-02-04 NOTE — Addendum Note (Signed)
Addended by: Tommas OlpHANDY, Derita Michelsen N on: 02/04/2015 02:54 PM   Modules accepted: Orders

## 2015-02-04 NOTE — Progress Notes (Signed)
Lab only 

## 2015-02-05 DIAGNOSIS — D510 Vitamin B12 deficiency anemia due to intrinsic factor deficiency: Secondary | ICD-10-CM | POA: Diagnosis not present

## 2015-02-05 DIAGNOSIS — N400 Enlarged prostate without lower urinary tract symptoms: Secondary | ICD-10-CM | POA: Diagnosis not present

## 2015-02-05 DIAGNOSIS — Z86718 Personal history of other venous thrombosis and embolism: Secondary | ICD-10-CM | POA: Diagnosis not present

## 2015-02-05 DIAGNOSIS — E46 Unspecified protein-calorie malnutrition: Secondary | ICD-10-CM | POA: Diagnosis not present

## 2015-02-05 DIAGNOSIS — F419 Anxiety disorder, unspecified: Secondary | ICD-10-CM | POA: Diagnosis not present

## 2015-02-05 DIAGNOSIS — R41841 Cognitive communication deficit: Secondary | ICD-10-CM | POA: Diagnosis not present

## 2015-02-05 LAB — CMP14+EGFR
ALK PHOS: 85 IU/L (ref 39–117)
ALT: 17 IU/L (ref 0–44)
AST: 22 IU/L (ref 0–40)
Albumin/Globulin Ratio: 1.3 (ref 1.1–2.5)
Albumin: 4.5 g/dL (ref 3.6–4.8)
BUN/Creatinine Ratio: 16 (ref 10–22)
BUN: 19 mg/dL (ref 8–27)
Bilirubin Total: 0.7 mg/dL (ref 0.0–1.2)
CALCIUM: 10 mg/dL (ref 8.6–10.2)
CO2: 20 mmol/L (ref 18–29)
Chloride: 102 mmol/L (ref 97–108)
Creatinine, Ser: 1.21 mg/dL (ref 0.76–1.27)
GFR calc Af Amer: 72 mL/min/{1.73_m2} (ref >59)
GFR, EST NON AFRICAN AMERICAN: 62 mL/min/{1.73_m2} (ref >59)
GLOBULIN, TOTAL: 3.6 g/dL (ref 1.5–4.5)
GLUCOSE: 132 mg/dL — AB (ref 65–99)
Potassium: 4 mmol/L (ref 3.5–5.2)
Sodium: 141 mmol/L (ref 134–144)
Total Protein: 8.1 g/dL (ref 6.0–8.5)

## 2015-02-05 LAB — VITAMIN D 25 HYDROXY (VIT D DEFICIENCY, FRACTURES): VIT D 25 HYDROXY: 28.7 ng/mL — AB (ref 30.0–100.0)

## 2015-02-05 LAB — THYROID PANEL WITH TSH
FREE THYROXINE INDEX: 2.1 (ref 1.2–4.9)
T3 Uptake Ratio: 25 % (ref 24–39)
T4, Total: 8.3 ug/dL (ref 4.5–12.0)
TSH: 1.27 u[IU]/mL (ref 0.450–4.500)

## 2015-02-05 LAB — LIPID PANEL
CHOL/HDL RATIO: 7.4 ratio — AB (ref 0.0–5.0)
Cholesterol, Total: 207 mg/dL — ABNORMAL HIGH (ref 100–199)
HDL: 28 mg/dL — AB (ref >39)
LDL CALC: 141 mg/dL — AB (ref 0–99)
Triglycerides: 189 mg/dL — ABNORMAL HIGH (ref 0–149)
VLDL CHOLESTEROL CAL: 38 mg/dL (ref 5–40)

## 2015-02-05 LAB — PSA: PSA: 1.3 ng/mL (ref 0.0–4.0)

## 2015-02-06 ENCOUNTER — Encounter: Payer: Self-pay | Admitting: Physician Assistant

## 2015-02-06 LAB — FECAL OCCULT BLOOD, IMMUNOCHEMICAL: FECAL OCCULT BLD: POSITIVE — AB

## 2015-02-06 MED ORDER — CIPROFLOXACIN HCL 500 MG PO TABS: 500.0000 mg | ORAL_TABLET | Freq: Two times a day (BID) | ORAL | Status: DC

## 2015-02-07 DIAGNOSIS — N400 Enlarged prostate without lower urinary tract symptoms: Secondary | ICD-10-CM | POA: Diagnosis not present

## 2015-02-07 DIAGNOSIS — R41841 Cognitive communication deficit: Secondary | ICD-10-CM | POA: Diagnosis not present

## 2015-02-07 DIAGNOSIS — Z86718 Personal history of other venous thrombosis and embolism: Secondary | ICD-10-CM | POA: Diagnosis not present

## 2015-02-07 DIAGNOSIS — E46 Unspecified protein-calorie malnutrition: Secondary | ICD-10-CM | POA: Diagnosis not present

## 2015-02-07 DIAGNOSIS — F419 Anxiety disorder, unspecified: Secondary | ICD-10-CM | POA: Diagnosis not present

## 2015-02-07 DIAGNOSIS — D510 Vitamin B12 deficiency anemia due to intrinsic factor deficiency: Secondary | ICD-10-CM | POA: Diagnosis not present

## 2015-02-07 LAB — URINE CULTURE

## 2015-02-13 DIAGNOSIS — E46 Unspecified protein-calorie malnutrition: Secondary | ICD-10-CM | POA: Diagnosis not present

## 2015-02-13 DIAGNOSIS — N400 Enlarged prostate without lower urinary tract symptoms: Secondary | ICD-10-CM | POA: Diagnosis not present

## 2015-02-13 DIAGNOSIS — D510 Vitamin B12 deficiency anemia due to intrinsic factor deficiency: Secondary | ICD-10-CM | POA: Diagnosis not present

## 2015-02-13 DIAGNOSIS — R41841 Cognitive communication deficit: Secondary | ICD-10-CM | POA: Diagnosis not present

## 2015-02-13 DIAGNOSIS — F419 Anxiety disorder, unspecified: Secondary | ICD-10-CM | POA: Diagnosis not present

## 2015-02-13 DIAGNOSIS — Z86718 Personal history of other venous thrombosis and embolism: Secondary | ICD-10-CM | POA: Diagnosis not present

## 2015-02-14 DIAGNOSIS — D510 Vitamin B12 deficiency anemia due to intrinsic factor deficiency: Secondary | ICD-10-CM | POA: Diagnosis not present

## 2015-02-14 DIAGNOSIS — Z86718 Personal history of other venous thrombosis and embolism: Secondary | ICD-10-CM | POA: Diagnosis not present

## 2015-02-14 DIAGNOSIS — N400 Enlarged prostate without lower urinary tract symptoms: Secondary | ICD-10-CM | POA: Diagnosis not present

## 2015-02-14 DIAGNOSIS — R41841 Cognitive communication deficit: Secondary | ICD-10-CM | POA: Diagnosis not present

## 2015-02-14 DIAGNOSIS — F419 Anxiety disorder, unspecified: Secondary | ICD-10-CM | POA: Diagnosis not present

## 2015-02-14 DIAGNOSIS — E46 Unspecified protein-calorie malnutrition: Secondary | ICD-10-CM | POA: Diagnosis not present

## 2015-02-20 DIAGNOSIS — E46 Unspecified protein-calorie malnutrition: Secondary | ICD-10-CM | POA: Diagnosis not present

## 2015-02-20 DIAGNOSIS — N400 Enlarged prostate without lower urinary tract symptoms: Secondary | ICD-10-CM | POA: Diagnosis not present

## 2015-02-20 DIAGNOSIS — Z86718 Personal history of other venous thrombosis and embolism: Secondary | ICD-10-CM | POA: Diagnosis not present

## 2015-02-20 DIAGNOSIS — D510 Vitamin B12 deficiency anemia due to intrinsic factor deficiency: Secondary | ICD-10-CM | POA: Diagnosis not present

## 2015-02-20 DIAGNOSIS — R41841 Cognitive communication deficit: Secondary | ICD-10-CM | POA: Diagnosis not present

## 2015-02-20 DIAGNOSIS — F419 Anxiety disorder, unspecified: Secondary | ICD-10-CM | POA: Diagnosis not present

## 2015-02-21 DIAGNOSIS — D510 Vitamin B12 deficiency anemia due to intrinsic factor deficiency: Secondary | ICD-10-CM | POA: Diagnosis not present

## 2015-02-21 DIAGNOSIS — Z86718 Personal history of other venous thrombosis and embolism: Secondary | ICD-10-CM | POA: Diagnosis not present

## 2015-02-21 DIAGNOSIS — N400 Enlarged prostate without lower urinary tract symptoms: Secondary | ICD-10-CM | POA: Diagnosis not present

## 2015-02-21 DIAGNOSIS — R41841 Cognitive communication deficit: Secondary | ICD-10-CM | POA: Diagnosis not present

## 2015-02-21 DIAGNOSIS — F419 Anxiety disorder, unspecified: Secondary | ICD-10-CM | POA: Diagnosis not present

## 2015-02-21 DIAGNOSIS — E46 Unspecified protein-calorie malnutrition: Secondary | ICD-10-CM | POA: Diagnosis not present

## 2015-03-06 DIAGNOSIS — N400 Enlarged prostate without lower urinary tract symptoms: Secondary | ICD-10-CM | POA: Diagnosis not present

## 2015-03-06 DIAGNOSIS — R41841 Cognitive communication deficit: Secondary | ICD-10-CM | POA: Diagnosis not present

## 2015-03-06 DIAGNOSIS — E46 Unspecified protein-calorie malnutrition: Secondary | ICD-10-CM | POA: Diagnosis not present

## 2015-03-06 DIAGNOSIS — D510 Vitamin B12 deficiency anemia due to intrinsic factor deficiency: Secondary | ICD-10-CM | POA: Diagnosis not present

## 2015-03-06 DIAGNOSIS — Z86718 Personal history of other venous thrombosis and embolism: Secondary | ICD-10-CM | POA: Diagnosis not present

## 2015-03-06 DIAGNOSIS — F419 Anxiety disorder, unspecified: Secondary | ICD-10-CM | POA: Diagnosis not present

## 2015-03-08 ENCOUNTER — Ambulatory Visit (INDEPENDENT_AMBULATORY_CARE_PROVIDER_SITE_OTHER): Payer: Medicare Other | Admitting: Physician Assistant

## 2015-03-08 ENCOUNTER — Encounter: Payer: Self-pay | Admitting: Physician Assistant

## 2015-03-08 VITALS — BP 127/72 | HR 83 | Temp 97.1°F | Ht 67.0 in | Wt 194.0 lb

## 2015-03-08 DIAGNOSIS — I10 Essential (primary) hypertension: Secondary | ICD-10-CM

## 2015-03-08 DIAGNOSIS — Z1211 Encounter for screening for malignant neoplasm of colon: Secondary | ICD-10-CM | POA: Diagnosis not present

## 2015-03-08 DIAGNOSIS — K921 Melena: Secondary | ICD-10-CM

## 2015-03-08 DIAGNOSIS — D529 Folate deficiency anemia, unspecified: Secondary | ICD-10-CM

## 2015-03-08 DIAGNOSIS — N3090 Cystitis, unspecified without hematuria: Secondary | ICD-10-CM

## 2015-03-08 LAB — POCT URINALYSIS DIPSTICK
Bilirubin, UA: NEGATIVE
GLUCOSE UA: NEGATIVE
Ketones, UA: NEGATIVE
Leukocytes, UA: NEGATIVE
Nitrite, UA: NEGATIVE
PH UA: 6
Spec Grav, UA: 1.025
Urobilinogen, UA: NEGATIVE

## 2015-03-08 LAB — POCT UA - MICROSCOPIC ONLY
Bacteria, U Microscopic: NEGATIVE
CASTS, UR, LPF, POC: NEGATIVE
Crystals, Ur, HPF, POC: NEGATIVE
Yeast, UA: NEGATIVE

## 2015-03-08 MED ORDER — FOLIC ACID 1 MG PO TABS: 1.0000 mg | ORAL_TABLET | Freq: Every day | ORAL | Status: DC

## 2015-03-08 NOTE — Progress Notes (Signed)
   Subjective:    Patient ID: Edwin [name], male    DOB: 06/21/1951, 64 y.o.   MRN: [id number]  HPI 64 y/o male presents for recheck on UTI and blood in stool. Was given refills of his medications at his last visit but needs repeat u/a and BMP since restarting medications. He states that he is feeling much better and appears to be more coherent today with not as much confusion. Accompanied by his son.     Review of Systems  Gastrointestinal: Positive for blood in stool (occasional, no history of colonoscopy ). Negative for nausea, abdominal pain, diarrhea, constipation and abdominal distention.  Genitourinary: Negative.   All other systems reviewed and are negative.      Objective:   Physical Exam  Constitutional: He is oriented to person, place, and time. He appears well-developed and well-nourished. No distress.  HENT:  Head: Normocephalic and atraumatic.  Cardiovascular: Normal rate, regular rhythm and normal heart sounds.  Exam reveals no gallop and no friction rub.   No murmur heard. BP WNL  Pulmonary/Chest: Effort normal and breath sounds normal. No respiratory distress.  Genitourinary:  U/a negative for bacteria  Neurological: He is alert and oriented to person, place, and time.  Skin: He is not diaphoretic.  Psychiatric: He has a normal mood and affect. His behavior is normal. Judgment and thought content normal.  Confusion much improved from last visit  Nursing note and vitals reviewed.         Assessment & Plan:  1. Cystitis  - Recheck U/A to determine if further abx treatment needed   2. HTN  - Recheck CBC, CMET after restarting antihypertensive   3. Screening colonscopy, report of hematochezia - Refer to GI - colonoscopy , blood in stool  4. Folate Deficiency - Restart Folate 1mg  PO q day - check folic acid level   F/U in 3 months for recheck

## 2015-03-09 ENCOUNTER — Encounter: Payer: Self-pay | Admitting: Physician Assistant

## 2015-03-09 LAB — CBC WITH DIFFERENTIAL/PLATELET
BASOS: 1 %
Basophils Absolute: 0 10*3/uL (ref 0.0–0.2)
EOS (ABSOLUTE): 0.2 10*3/uL (ref 0.0–0.4)
Eos: 3 %
Hematocrit: 43.2 % (ref 37.5–51.0)
Hemoglobin: 14.2 g/dL (ref 12.6–17.7)
IMMATURE GRANS (ABS): 0 10*3/uL (ref 0.0–0.1)
Immature Granulocytes: 0 %
Lymphocytes Absolute: 1.9 10*3/uL (ref 0.7–3.1)
Lymphs: 26 %
MCH: 27.4 pg (ref 26.6–33.0)
MCHC: 32.9 g/dL (ref 31.5–35.7)
MCV: 83 fL (ref 79–97)
MONOCYTES: 4 %
Monocytes Absolute: 0.3 10*3/uL (ref 0.1–0.9)
Neutrophils Absolute: 5 10*3/uL (ref 1.4–7.0)
Neutrophils: 66 %
Platelets: 260 10*3/uL (ref 150–379)
RBC: 5.19 x10E6/uL (ref 4.14–5.80)
RDW: 15.9 % — AB (ref 12.3–15.4)
WBC: 7.5 10*3/uL (ref 3.4–10.8)

## 2015-03-10 LAB — URINE CULTURE

## 2015-03-11 ENCOUNTER — Other Ambulatory Visit: Payer: Self-pay | Admitting: Physician Assistant

## 2015-03-11 DIAGNOSIS — N3000 Acute cystitis without hematuria: Secondary | ICD-10-CM

## 2015-03-11 MED ORDER — NITROFURANTOIN MONOHYD MACRO 100 MG PO CAPS: 100.0000 mg | ORAL_CAPSULE | Freq: Two times a day (BID) | ORAL | Status: DC

## 2015-03-19 ENCOUNTER — Telehealth: Payer: Self-pay | Admitting: Family Medicine

## 2015-03-19 DIAGNOSIS — Z86718 Personal history of other venous thrombosis and embolism: Secondary | ICD-10-CM | POA: Diagnosis not present

## 2015-03-19 DIAGNOSIS — E46 Unspecified protein-calorie malnutrition: Secondary | ICD-10-CM | POA: Diagnosis not present

## 2015-03-19 DIAGNOSIS — D510 Vitamin B12 deficiency anemia due to intrinsic factor deficiency: Secondary | ICD-10-CM | POA: Diagnosis not present

## 2015-03-19 DIAGNOSIS — N400 Enlarged prostate without lower urinary tract symptoms: Secondary | ICD-10-CM | POA: Diagnosis not present

## 2015-03-19 DIAGNOSIS — R41841 Cognitive communication deficit: Secondary | ICD-10-CM | POA: Diagnosis not present

## 2015-03-19 DIAGNOSIS — F419 Anxiety disorder, unspecified: Secondary | ICD-10-CM | POA: Diagnosis not present

## 2015-03-19 NOTE — Telephone Encounter (Signed)
rxs done in April, advanced aware

## 2015-03-20 ENCOUNTER — Telehealth: Payer: Self-pay | Admitting: Physician Assistant

## 2015-03-20 NOTE — Telephone Encounter (Signed)
Edwin [name] called in 03/20/15

## 2015-03-25 DIAGNOSIS — Z86718 Personal history of other venous thrombosis and embolism: Secondary | ICD-10-CM | POA: Diagnosis not present

## 2015-03-25 DIAGNOSIS — D510 Vitamin B12 deficiency anemia due to intrinsic factor deficiency: Secondary | ICD-10-CM | POA: Diagnosis not present

## 2015-03-25 DIAGNOSIS — N400 Enlarged prostate without lower urinary tract symptoms: Secondary | ICD-10-CM | POA: Diagnosis not present

## 2015-03-25 DIAGNOSIS — E46 Unspecified protein-calorie malnutrition: Secondary | ICD-10-CM | POA: Diagnosis not present

## 2015-03-25 DIAGNOSIS — F419 Anxiety disorder, unspecified: Secondary | ICD-10-CM | POA: Diagnosis not present

## 2015-03-25 DIAGNOSIS — R41841 Cognitive communication deficit: Secondary | ICD-10-CM | POA: Diagnosis not present

## 2015-04-02 ENCOUNTER — Ambulatory Visit: Payer: Medicare Other | Admitting: Urology

## 2015-05-14 ENCOUNTER — Ambulatory Visit (INDEPENDENT_AMBULATORY_CARE_PROVIDER_SITE_OTHER): Payer: Medicare Other | Admitting: Urology

## 2015-05-14 DIAGNOSIS — N210 Calculus in bladder: Secondary | ICD-10-CM

## 2015-05-14 DIAGNOSIS — N320 Bladder-neck obstruction: Secondary | ICD-10-CM | POA: Diagnosis not present

## 2015-05-21 ENCOUNTER — Encounter: Payer: Self-pay | Admitting: Physician Assistant

## 2015-05-21 ENCOUNTER — Ambulatory Visit (INDEPENDENT_AMBULATORY_CARE_PROVIDER_SITE_OTHER): Payer: Medicare Other | Admitting: Physician Assistant

## 2015-05-21 ENCOUNTER — Other Ambulatory Visit: Payer: Self-pay | Admitting: Physician Assistant

## 2015-05-21 VITALS — BP 176/99 | HR 115 | Temp 97.2°F | Ht 67.0 in | Wt 194.8 lb

## 2015-05-21 DIAGNOSIS — R319 Hematuria, unspecified: Secondary | ICD-10-CM

## 2015-05-21 DIAGNOSIS — N390 Urinary tract infection, site not specified: Secondary | ICD-10-CM | POA: Diagnosis not present

## 2015-05-21 DIAGNOSIS — N3001 Acute cystitis with hematuria: Secondary | ICD-10-CM

## 2015-05-21 DIAGNOSIS — I10 Essential (primary) hypertension: Secondary | ICD-10-CM

## 2015-05-21 LAB — POCT URINALYSIS DIPSTICK
Bilirubin, UA: NEGATIVE
Glucose, UA: NEGATIVE
Ketones, UA: NEGATIVE
Nitrite, UA: NEGATIVE
Spec Grav, UA: 1.02
Urobilinogen, UA: NEGATIVE
pH, UA: 6.5

## 2015-05-21 LAB — POCT UA - MICROSCOPIC ONLY
Bacteria, U Microscopic: NEGATIVE
Casts, Ur, LPF, POC: NEGATIVE
Crystals, Ur, HPF, POC: NEGATIVE
Yeast, UA: NEGATIVE

## 2015-05-21 MED ORDER — LISINOPRIL-HYDROCHLOROTHIAZIDE 10-12.5 MG PO TABS: 1.0000 | ORAL_TABLET | Freq: Every day | ORAL | Status: DC

## 2015-05-21 MED ORDER — CIPROFLOXACIN HCL 500 MG PO TABS: 500.0000 mg | ORAL_TABLET | Freq: Two times a day (BID) | ORAL | Status: DC

## 2015-05-21 NOTE — Progress Notes (Signed)
   Subjective:    Patient ID: Edwin [name], male    DOB: 1950/12/04, 64 y.o.   MRN: [id number]  HPI 64 y/o male presents for recheck of htn and uti. His bp is elevated today, however, he states that he has been having home health come out and his readings have been in the 120's over 80's. He took his last bp medication yesterday and has not had any today.   He has questions about taking him off some of his medications because he is not having any symptoms, however, I have discussed with him that he is not having symptoms because they are under control with the medication      Review of Systems  Constitutional: Negative.   HENT: Negative.   Eyes: Negative.   Respiratory: Negative.   Cardiovascular: Negative.   Gastrointestinal: Negative.   Endocrine: Negative for polyuria.  Genitourinary: Negative for dysuria, urgency, frequency, hematuria, flank pain and difficulty urinating.  Musculoskeletal: Negative for myalgias and back pain.       Objective:   Physical Exam  Constitutional: He appears well-developed and well-nourished. No distress.  Cardiovascular: Exam reveals no gallop and no friction rub.   No murmur heard. Hypertensive and mildly tachycardic   Pulmonary/Chest: Effort normal and breath sounds normal. No respiratory distress. He has no wheezes. He has no rales. He exhibits no tenderness.  Musculoskeletal: He exhibits no edema or tenderness.  Skin: He is not diaphoretic.  Psychiatric: He has a normal mood and affect. His behavior is normal. Judgment and thought content normal.  Nursing note and vitals reviewed.         Assessment & Plan:  1. Essential hypertension  - lisinopril-hydrochlorothiazide (PRINZIDE,ZESTORETIC) 10-12.5 MG per tablet; Take 1 tablet by mouth daily.  Dispense: 30 tablet; Refill: 5  2. Urinary tract infection with hematuria, site unspecified  - POCT urinalysis dipstick - POCT UA - Microscopic Only   Continue all meds Labs pending Health  Maintenance reviewed Diet and exercise encouraged RTO 6 months   Nika Yazzie A. Chauncey Reading PA-C

## 2015-05-21 NOTE — Addendum Note (Signed)
Addended by: Prescott Gum on: 05/21/2015 03:18 PM   Modules accepted: Orders

## 2015-05-23 LAB — URINE CULTURE

## 2015-05-24 ENCOUNTER — Other Ambulatory Visit: Payer: Self-pay | Admitting: Physician Assistant

## 2015-05-24 DIAGNOSIS — N3090 Cystitis, unspecified without hematuria: Secondary | ICD-10-CM

## 2015-05-24 MED ORDER — NITROFURANTOIN MONOHYD MACRO 100 MG PO CAPS: 100.0000 mg | ORAL_CAPSULE | Freq: Two times a day (BID) | ORAL | Status: DC

## 2015-05-24 NOTE — Progress Notes (Signed)
Patient aware. Will pick up new rx.

## 2015-05-30 ENCOUNTER — Other Ambulatory Visit: Payer: Self-pay | Admitting: Physician Assistant

## 2015-05-30 NOTE — Telephone Encounter (Signed)
Pt doing better - not needed

## 2015-05-30 NOTE — Telephone Encounter (Signed)
Why is this being requested? He was prescribed Cipro  BID x 10 days on 05/21/15. Sent to pharmacy Has he completed that?   Jadalynn Burr A. Chauncey Reading PA-C

## 2015-06-04 ENCOUNTER — Other Ambulatory Visit: Payer: Self-pay | Admitting: Physician Assistant

## 2015-06-05 NOTE — Telephone Encounter (Signed)
Last seen 05/21/15  Tiffany 

## 2015-06-05 NOTE — Telephone Encounter (Signed)
I have explained to him and his son that he does not need this medication. I send over a new script after his last visit because the bacteria in the UTI was resistant to the one he is requesting. He does not need refills.   Tiffany A. Chauncey Reading PA-C

## 2015-06-17 ENCOUNTER — Encounter (HOSPITAL_BASED_OUTPATIENT_CLINIC_OR_DEPARTMENT_OTHER): Payer: Medicare Other | Admitting: Hematology & Oncology

## 2015-06-17 ENCOUNTER — Encounter (HOSPITAL_COMMUNITY): Payer: Self-pay | Admitting: Hematology & Oncology

## 2015-06-17 ENCOUNTER — Ambulatory Visit (HOSPITAL_COMMUNITY): Payer: Medicare Other | Admitting: Hematology & Oncology

## 2015-06-17 ENCOUNTER — Encounter (HOSPITAL_COMMUNITY): Payer: Medicare Other | Attending: Hematology & Oncology

## 2015-06-17 VITALS — BP 156/97 | HR 105 | Temp 98.7°F | Resp 15 | Wt 191.1 lb

## 2015-06-17 DIAGNOSIS — E538 Deficiency of other specified B group vitamins: Secondary | ICD-10-CM | POA: Diagnosis not present

## 2015-06-17 DIAGNOSIS — D61818 Other pancytopenia: Secondary | ICD-10-CM

## 2015-06-17 DIAGNOSIS — I82411 Acute embolism and thrombosis of right femoral vein: Secondary | ICD-10-CM

## 2015-06-17 DIAGNOSIS — D531 Other megaloblastic anemias, not elsewhere classified: Secondary | ICD-10-CM | POA: Insufficient documentation

## 2015-06-17 LAB — COMPREHENSIVE METABOLIC PANEL
ALBUMIN: 4.5 g/dL (ref 3.5–5.0)
ALT: 18 U/L (ref 17–63)
AST: 29 U/L (ref 15–41)
Alkaline Phosphatase: 72 U/L (ref 38–126)
Anion gap: 10 (ref 5–15)
BUN: 19 mg/dL (ref 6–20)
CO2: 26 mmol/L (ref 22–32)
CREATININE: 1.17 mg/dL (ref 0.61–1.24)
Calcium: 9.2 mg/dL (ref 8.9–10.3)
Chloride: 103 mmol/L (ref 101–111)
GFR calc Af Amer: >60 mL/min (ref >60)
Glucose, Bld: 153 mg/dL — ABNORMAL HIGH (ref 65–99)
Potassium: 3.3 mmol/L — ABNORMAL LOW (ref 3.5–5.1)
Sodium: 139 mmol/L (ref 135–145)
Total Bilirubin: 0.9 mg/dL (ref 0.3–1.2)
Total Protein: 8.2 g/dL — ABNORMAL HIGH (ref 6.5–8.1)

## 2015-06-17 LAB — CBC WITH DIFFERENTIAL/PLATELET
BASOS PCT: 0 % (ref 0–1)
Basophils Absolute: 0 10*3/uL (ref 0.0–0.1)
Eosinophils Absolute: 0.3 10*3/uL (ref 0.0–0.7)
Eosinophils Relative: 3 % (ref 0–5)
HCT: 42.1 % (ref 39.0–52.0)
Hemoglobin: 13.7 g/dL (ref 13.0–17.0)
LYMPHS PCT: 20 % (ref 12–46)
Lymphs Abs: 1.9 10*3/uL (ref 0.7–4.0)
MCH: 28.7 pg (ref 26.0–34.0)
MCHC: 32.5 g/dL (ref 30.0–36.0)
MCV: 88.3 fL (ref 78.0–100.0)
Monocytes Absolute: 0.4 10*3/uL (ref 0.1–1.0)
Monocytes Relative: 4 % (ref 3–12)
NEUTROS ABS: 7.2 10*3/uL (ref 1.7–7.7)
Neutrophils Relative %: 73 % (ref 43–77)
Platelets: 267 10*3/uL (ref 150–400)
RBC: 4.77 MIL/uL (ref 4.22–5.81)
RDW: 15 % (ref 11.5–15.5)
WBC: 9.8 10*3/uL (ref 4.0–10.5)

## 2015-06-17 LAB — FOLATE: Folate: 56.4 ng/mL (ref >5.9)

## 2015-06-17 LAB — VITAMIN B12: VITAMIN B 12: 232 pg/mL (ref 180–914)

## 2015-06-17 NOTE — Progress Notes (Signed)
Lynwood Dawley, PA-C [phone] [place] [address]    DIAGNOSIS:Thrombosis of R femoral vein, October 2015 Pernicious anemia, B12 less than 10  Profound Vitamin D deficiency IVC filter placement secondary to hematuria on Eliquis, bladder stones Thrombophilia evaluation negative, with noted abnormalites felt to be secondary to acquisition of studies during acute setting  CURRENT THERAPY: Observation  INTERVAL HISTORY: Edwin [name] 64 y.o. male returns for  follow-up of severe B12 deficiency, and thrombosis of the right femoral vein. Per available medical records he developed significant hematuria while on eliquis, anticoagulation was stopped and an IVC filter was placed. Secondary to his profound B12 deficiency he had pancytopenia and also neurologic deficits.  He has been feeling well and is here alone today. He has been eating well and his energy levels are unchanged. He has been closely monitoring his blood pressure with his son checking it every other day. He has been trying to decrease his overall blood pressure. He has been following with Lynwood Dawley.  He denies having any recent Vitamin B12 shots. His last shot was around May. MEDICAL HISTORY: Past Medical History  Diagnosis Date  . Encephalopathy   . UTI (lower urinary tract infection)   . Renal disorder     Bladder stone,Bladder diverticulm  . Hx of sepsis 12/15  . Peripheral vascular disease 10/11/14    acute embolism and thrombosis Rt femoral vein  . Dysphagia 10/11/14  . Blood dyscrasia     pancytopenia  . Anemia     nutritional anemia  . Anemia     posthemorrhagic anemia  . Neuromuscular disorder     muscle weakness  . Failure to thrive in adult 08/22/14    has Acute encephalopathy; Adult failure to thrive; UTI (lower urinary tract infection); Pancytopenia; Severe protein-calorie malnutrition; Vitamin B12 deficiency; Right leg DVT; Megaloblastic anemia due to B12 deficiency; Vitamin D deficiency;  Malnutrition of moderate degree; Hematuria; Encephalopathy; Fever; Sepsis; S/P IVC filter; Bladder calculus; Calculus in bladder; Screening for lipid disorders; Essential hypertension; Neuropathic pain; BPH (benign prostatic hyperplasia); Generalized anxiety disorder; and Vitamin D deficiency on his problem list.     has No Known Allergies.  Edwin [name] does not currently have medications on file.  SURGICAL HISTORY: Past Surgical History  Procedure Laterality Date  . Cystoscopy N/A 08/21/2014    Procedure: CYSTOSCOPY FLEXIBLE;  Surgeon: Chelsea Aus, MD;  Location: [facility];  Service: Urology;  Laterality: N/A;  at bedside  . Cystoscopy with litholapaxy N/A 11/22/2014    Procedure: CYSTOSCOPY WITH LITHOLAPAXY;  Surgeon: Chelsea Aus, MD;  Location: [facility];  Service: Urology;  Laterality: N/A;  . Transurethral incision of prostate N/A 11/22/2014    Procedure: TRANSURETHRAL INCISION OF THE PROSTATE (TUIP);  Surgeon: Chelsea Aus, MD;  Location: [facility];  Service: Urology;  Laterality: N/A;    SOCIAL HISTORY: Social History   Social History  . Marital Status: Single    Spouse Name: N/A  . Number of Children: N/A  . Years of Education: N/A   Occupational History  . Not on file.   Social History Main Topics  . Smoking status: Light Tobacco Smoker    Types: Cigars  . Smokeless tobacco: Never Used  . Alcohol Use: No  . Drug Use: No  . Sexual Activity: No   Other Topics Concern  . Not on file   Social History Narrative   ** Merged History Encounter **  FAMILY HISTORY: Denies    Review of Systems  Constitutional: Negative for fever, chills, weight loss and malaise/fatigue.  HENT: Negative for congestion, hearing loss, nosebleeds, sore throat and tinnitus.   Eyes: Negative for blurred vision, double vision, pain and discharge.  Respiratory: Negative for cough, hemoptysis, sputum production, shortness of breath and wheezing.   Cardiovascular: Negative  for chest pain, palpitations, claudication, leg swelling and PND.  Gastrointestinal: Negative for heartburn, nausea, vomiting, abdominal pain, diarrhea, constipation, blood in stool and melena.  Genitourinary: Negative for dysuria, urgency, frequency and hematuria.  Musculoskeletal: Negative for myalgias, joint pain and falls.  Skin: Negative for itching and rash.  Neurological: Negative for dizziness, tingling, tremors, sensory change, speech change, focal weakness, seizures, loss of consciousness, weakness and headaches.  Endo/Heme/Allergies: Does not bruise/bleed easily.  Psychiatric/Behavioral: Negative for depression, suicidal ideas, memory loss and substance abuse. The patient is not nervous/anxious and does not have insomnia.   14 point review of systems was performed and is negative except as detailed under history of present illness and above   PHYSICAL EXAMINATION  ECOG PERFORMANCE STATUS: 1 - Symptomatic but completely ambulatory  Filed Vitals:   06/17/15 1311  BP: 156/97  Pulse: 105  Temp: 98.7 F (37.1 C)  Resp: 15    Physical Exam  Constitutional: He is oriented to person, place, and time and well-developed, well-nourished, and in no distress.  HENT:  Head: Normocephalic and atraumatic.  Nose: Nose normal.  Mouth/Throat: Oropharynx is clear and moist. No oropharyngeal exudate.  Poor dental care.  Eyes: Conjunctivae and EOM are normal. Pupils are equal, round, and reactive to light. Right eye exhibits no discharge. Left eye exhibits no discharge. No scleral icterus.  Neck: Normal range of motion. Neck supple. No tracheal deviation present. No thyromegaly present.  Cardiovascular: Normal rate, regular rhythm and normal heart sounds.  Exam reveals no gallop and no friction rub.   No murmur heard. Pulmonary/Chest: Effort normal and breath sounds normal. He has no wheezes. He has no rales.  Abdominal: Soft. Bowel sounds are normal. He exhibits no distension and no mass.  There is no tenderness. There is no rebound and no guarding.  Musculoskeletal: Normal range of motion. He exhibits no edema.  Lymphadenopathy:    He has no cervical adenopathy.  Neurological: He is alert and oriented to person, place, and time. He has normal reflexes. No cranial nerve deficit. Coordination abnormal.  Skin: Skin is warm and dry. No rash noted.  Psychiatric: Mood, memory, affect and judgment normal.  Nursing note and vitals reviewed.   LABORATORY DATA:  CBC    Component Value Date/Time   WBC 9.8 06/17/2015 1300   WBC 7.5 03/08/2015 0924   WBC 9.1 02/04/2015 1453   RBC 4.77 06/17/2015 1300   RBC 5.19 03/08/2015 0924   RBC 5.14 02/04/2015 1453   RBC 2.02* 08/14/2014 0041   HGB 13.7 06/17/2015 1300   HGB 13.2* 02/04/2015 1453   HCT 42.1 06/17/2015 1300   HCT 43.2 03/08/2015 0924   HCT 43.4* 02/04/2015 1453   PLT 267 06/17/2015 1300   MCV 88.3 06/17/2015 1300   MCV 84.4 02/04/2015 1453   MCH 28.7 06/17/2015 1300   MCH 27.4 03/08/2015 0924   MCH 25.7* 02/04/2015 1453   MCHC 32.5 06/17/2015 1300   MCHC 32.9 03/08/2015 0924   MCHC 30.5* 02/04/2015 1453   RDW 15.0 06/17/2015 1300   RDW 15.9* 03/08/2015 0924   LYMPHSABS 1.9 06/17/2015 1300   LYMPHSABS 1.9 03/08/2015 9604  MONOABS 0.4 06/17/2015 1300   EOSABS 0.3 06/17/2015 1300   BASOSABS 0.0 06/17/2015 1300   BASOSABS 0.0 03/08/2015 0924   CMP     Component Value Date/Time   NA 139 06/17/2015 1300   NA 141 02/04/2015 0900   K 3.3* 06/17/2015 1300   CL 103 06/17/2015 1300   CO2 26 06/17/2015 1300   GLUCOSE 153* 06/17/2015 1300   GLUCOSE 132* 02/04/2015 0900   BUN 19 06/17/2015 1300   BUN 19 02/04/2015 0900   CREATININE 1.17 06/17/2015 1300   CALCIUM 9.2 06/17/2015 1300   PROT 8.2* 06/17/2015 1300   PROT 8.1 02/04/2015 0900   ALBUMIN 4.5 06/17/2015 1300   AST 29 06/17/2015 1300   ALT 18 06/17/2015 1300   ALKPHOS 72 06/17/2015 1300   BILITOT 0.9 06/17/2015 1300   BILITOT 0.7 02/04/2015 0900    GFRNONAA >60 06/17/2015 1300   GFRAA >60 06/17/2015 1300     ASSESSMENT and THERAPY PLAN:   B12 deficiency pancytopenia  He is to continue with B12 replacement. Blood counts have improved. He has not received a B12 injection to his knowledge since May. B12 level today is pending. We may need to arrange for him to have these here on a monthly basis. We will apprise him of his B12 level when it becomes available and I will arrange for ongoing B-12 replacement.  Lower extremity DVT  IVC filter remains in place. He is not on anticoagulation.  I will see Edwin [name] again in 6 months for a routine follow up.  All questions were answered. The patient knows to call the clinic with any problems, questions or concerns. We can certainly see the patient much sooner if necessary.  This note was electronically signed.  This document serves as a record of services personally performed by Loma Messing, MD. It was created on her behalf by Delana Meyer, a trained medical scribe. The creation of this record is based on the scribe's personal observations and the provider's statements to them. This document has been checked and approved by the attending provider.  I have reviewed the above documentation for accuracy and completeness, and I agree with the above.  Arvil Chaco 06/17/2015

## 2015-06-17 NOTE — Patient Instructions (Signed)
..  [facility] at [facility] Discharge Instructions  RECOMMENDATIONS MADE BY THE CONSULTANT AND ANY TEST RESULTS WILL BE SENT TO YOUR REFERRING PHYSICIAN.  Exam per Dr. Galen Manila  We will contact you once b12 level is back if you need to restart b12 shots.  Return is 6 months  Thank you for choosing [facility] at [facility] to provide your oncology and hematology care.  To afford each patient quality time with our provider, please arrive at least 15 minutes before your scheduled appointment time.    You need to re-schedule your appointment should you arrive 10 or more minutes late.  We strive to give you quality time with our providers, and arriving late affects you and other patients whose appointments are after yours.  Also, if you no show three or more times for appointments you may be dismissed from the clinic at the providers discretion.     Again, thank you for choosing [facility]  Our hope is that these requests will decrease the amount of time that you wait before being seen by our physicians.       _____________________________________________________________  Should you have questions after your visit to [facility], please contact our office at [phone] between the hours of 8:30 a.m. and 4:30 p.m.  Voicemails left after 4:30 p.m. will not be returned until the following business day.  For prescription refill requests, have your pharmacy contact our office.

## 2015-06-18 ENCOUNTER — Other Ambulatory Visit (HOSPITAL_COMMUNITY): Payer: Self-pay | Admitting: Hematology & Oncology

## 2015-06-18 DIAGNOSIS — D531 Other megaloblastic anemias, not elsewhere classified: Secondary | ICD-10-CM

## 2015-06-18 NOTE — Progress Notes (Signed)
Labs drawn

## 2015-06-25 ENCOUNTER — Encounter (HOSPITAL_COMMUNITY): Payer: Medicare Other | Attending: Oncology

## 2015-06-25 VITALS — BP 156/72 | HR 109 | Temp 98.5°F | Resp 20

## 2015-06-25 DIAGNOSIS — D531 Other megaloblastic anemias, not elsewhere classified: Secondary | ICD-10-CM

## 2015-06-25 MED ORDER — CYANOCOBALAMIN 1000 MCG/ML IJ SOLN
INTRAMUSCULAR | Status: AC
Start: 2015-06-25 — End: 2015-06-25
  Filled 2015-06-25: qty 1

## 2015-06-25 MED ORDER — CYANOCOBALAMIN 1000 MCG/ML IJ SOLN
1000.0000 ug | Freq: Once | INTRAMUSCULAR | Status: AC
  Administered 2015-06-25: 1000 ug via INTRAMUSCULAR

## 2015-06-25 NOTE — Progress Notes (Signed)
Edwin [name] presents today for injection per MD orders. B12 administered IM in right Upper Arm. Administration without incident. Patient tolerated well.

## 2015-07-03 ENCOUNTER — Encounter (HOSPITAL_COMMUNITY): Payer: Medicare Other | Attending: Hematology & Oncology

## 2015-07-03 ENCOUNTER — Encounter (HOSPITAL_COMMUNITY): Payer: Self-pay

## 2015-07-03 VITALS — BP 155/80 | HR 112 | Resp 20

## 2015-07-03 DIAGNOSIS — E538 Deficiency of other specified B group vitamins: Secondary | ICD-10-CM | POA: Insufficient documentation

## 2015-07-03 DIAGNOSIS — D61818 Other pancytopenia: Secondary | ICD-10-CM | POA: Insufficient documentation

## 2015-07-03 DIAGNOSIS — D531 Other megaloblastic anemias, not elsewhere classified: Secondary | ICD-10-CM | POA: Diagnosis present

## 2015-07-03 MED ORDER — CYANOCOBALAMIN 1000 MCG/ML IJ SOLN
1000.0000 ug | Freq: Once | INTRAMUSCULAR | Status: AC
Start: 2015-07-03 — End: 2015-07-03
  Administered 2015-07-03: 1000 ug via INTRAMUSCULAR

## 2015-07-03 NOTE — Progress Notes (Signed)
Edwin [name]'s reason for visit today is for an injection  as scheduled per MD orders.  Labs were drawn prior to administration of ordered medication.    Edwin [name] also received vitamin b12 injection per MD orders; see [facility] for administration details.  Edwin [name] tolerated all procedures well and without incident; questions were answered and patient was discharged.

## 2015-07-03 NOTE — Patient Instructions (Signed)
[facility] at [facility] Discharge Instructions  RECOMMENDATIONS MADE BY THE CONSULTANT AND ANY TEST RESULTS WILL BE SENT TO YOUR REFERRING PHYSICIAN.  Vitamin b12 injection  Follow up as scheduled Please call the clinic if you have any questions or concerns  Thank you for choosing [facility] at [facility] to provide your oncology and hematology care.  To afford each patient quality time with our provider, please arrive at least 15 minutes before your scheduled appointment time.    You need to re-schedule your appointment should you arrive 10 or more minutes late.  We strive to give you quality time with our providers, and arriving late affects you and other patients whose appointments are after yours.  Also, if you no show three or more times for appointments you may be dismissed from the clinic at the providers discretion.     Again, thank you for choosing [facility].  Our hope is that these requests will decrease the amount of time that you wait before being seen by our physicians.       _____________________________________________________________  Should you have questions after your visit to [facility], please contact our office at [phone] between the hours of 8:30 a.m. and 4:30 p.m.  Voicemails left after 4:30 p.m. will not be returned until the following business day.  For prescription refill requests, have your pharmacy contact our office.    

## 2015-07-09 ENCOUNTER — Encounter (HOSPITAL_BASED_OUTPATIENT_CLINIC_OR_DEPARTMENT_OTHER): Payer: Medicare Other

## 2015-07-09 ENCOUNTER — Encounter (HOSPITAL_COMMUNITY): Payer: Self-pay

## 2015-07-09 VITALS — BP 156/94 | HR 112 | Temp 97.3°F | Resp 18

## 2015-07-09 DIAGNOSIS — D510 Vitamin B12 deficiency anemia due to intrinsic factor deficiency: Secondary | ICD-10-CM

## 2015-07-09 DIAGNOSIS — D531 Other megaloblastic anemias, not elsewhere classified: Secondary | ICD-10-CM

## 2015-07-09 MED ORDER — CYANOCOBALAMIN 1000 MCG/ML IJ SOLN
1000.0000 ug | Freq: Once | INTRAMUSCULAR | Status: AC
Start: 2015-07-09 — End: 2015-07-09
  Administered 2015-07-09: 1000 ug via INTRAMUSCULAR

## 2015-07-09 NOTE — Patient Instructions (Signed)
[facility] at [facility] Discharge Instructions  RECOMMENDATIONS MADE BY THE CONSULTANT AND ANY TEST RESULTS WILL BE SENT TO YOUR REFERRING PHYSICIAN.  b12 injection today Follow up as scheduled Please call the clinic if you have any questions or concerns  Thank you for choosing [facility] at [facility] to provide your oncology and hematology care.  To afford each patient quality time with our provider, please arrive at least 15 minutes before your scheduled appointment time.    You need to re-schedule your appointment should you arrive 10 or more minutes late.  We strive to give you quality time with our providers, and arriving late affects you and other patients whose appointments are after yours.  Also, if you no show three or more times for appointments you may be dismissed from the clinic at the providers discretion.     Again, thank you for choosing [facility].  Our hope is that these requests will decrease the amount of time that you wait before being seen by our physicians.       _____________________________________________________________  Should you have questions after your visit to [facility], please contact our office at [phone] between the hours of 8:30 a.m. and 4:30 p.m.  Voicemails left after 4:30 p.m. will not be returned until the following business day.  For prescription refill requests, have your pharmacy contact our office.     

## 2015-07-09 NOTE — Progress Notes (Signed)
Edwin [name]'s reason for visit today is for an injection as scheduled per MD orders.    Edwin [name] also received vitamin b12  per MD orders; see [facility] for administration details.  Edwin [name] tolerated all procedures well and without incident; questions were answered and patient was discharged.

## 2015-07-16 ENCOUNTER — Inpatient Hospital Stay (HOSPITAL_COMMUNITY)
Admission: EM | Admit: 2015-07-16 | Discharge: 2015-07-20 | DRG: 683 | Disposition: A | Discharge status: Skilled Nursing Facility | Payer: Medicare Other | Attending: Internal Medicine | Admitting: Internal Medicine

## 2015-07-16 ENCOUNTER — Encounter (HOSPITAL_COMMUNITY): Payer: Self-pay

## 2015-07-16 ENCOUNTER — Encounter (HOSPITAL_COMMUNITY): Payer: Self-pay | Admitting: Emergency Medicine

## 2015-07-16 ENCOUNTER — Inpatient Hospital Stay (HOSPITAL_COMMUNITY): Payer: Medicare Other

## 2015-07-16 ENCOUNTER — Ambulatory Visit (HOSPITAL_COMMUNITY): Payer: Medicare Other

## 2015-07-16 ENCOUNTER — Emergency Department (HOSPITAL_COMMUNITY): Payer: Medicare Other

## 2015-07-16 DIAGNOSIS — R41841 Cognitive communication deficit: Secondary | ICD-10-CM | POA: Diagnosis not present

## 2015-07-16 DIAGNOSIS — R1030 Lower abdominal pain, unspecified: Secondary | ICD-10-CM | POA: Diagnosis not present

## 2015-07-16 DIAGNOSIS — D649 Anemia, unspecified: Secondary | ICD-10-CM | POA: Diagnosis present

## 2015-07-16 DIAGNOSIS — T464X5A Adverse effect of angiotensin-converting-enzyme inhibitors, initial encounter: Secondary | ICD-10-CM | POA: Diagnosis present

## 2015-07-16 DIAGNOSIS — Z820 Family history of epilepsy and other diseases of the nervous system: Secondary | ICD-10-CM

## 2015-07-16 DIAGNOSIS — K6389 Other specified diseases of intestine: Secondary | ICD-10-CM | POA: Diagnosis not present

## 2015-07-16 DIAGNOSIS — Z86718 Personal history of other venous thrombosis and embolism: Secondary | ICD-10-CM | POA: Diagnosis not present

## 2015-07-16 DIAGNOSIS — E871 Hypo-osmolality and hyponatremia: Secondary | ICD-10-CM | POA: Diagnosis not present

## 2015-07-16 DIAGNOSIS — E538 Deficiency of other specified B group vitamins: Secondary | ICD-10-CM | POA: Diagnosis present

## 2015-07-16 DIAGNOSIS — F17290 Nicotine dependence, other tobacco product, uncomplicated: Secondary | ICD-10-CM | POA: Diagnosis present

## 2015-07-16 DIAGNOSIS — I959 Hypotension, unspecified: Secondary | ICD-10-CM | POA: Diagnosis present

## 2015-07-16 DIAGNOSIS — E860 Dehydration: Secondary | ICD-10-CM | POA: Diagnosis present

## 2015-07-16 DIAGNOSIS — N179 Acute kidney failure, unspecified: Principal | ICD-10-CM | POA: Diagnosis present

## 2015-07-16 DIAGNOSIS — N178 Other acute kidney failure: Secondary | ICD-10-CM | POA: Diagnosis not present

## 2015-07-16 DIAGNOSIS — R531 Weakness: Secondary | ICD-10-CM | POA: Diagnosis not present

## 2015-07-16 DIAGNOSIS — R600 Localized edema: Secondary | ICD-10-CM | POA: Diagnosis not present

## 2015-07-16 DIAGNOSIS — E876 Hypokalemia: Secondary | ICD-10-CM | POA: Diagnosis not present

## 2015-07-16 DIAGNOSIS — R279 Unspecified lack of coordination: Secondary | ICD-10-CM | POA: Diagnosis not present

## 2015-07-16 DIAGNOSIS — N1330 Unspecified hydronephrosis: Secondary | ICD-10-CM | POA: Diagnosis present

## 2015-07-16 DIAGNOSIS — I82409 Acute embolism and thrombosis of unspecified deep veins of unspecified lower extremity: Secondary | ICD-10-CM

## 2015-07-16 DIAGNOSIS — Z809 Family history of malignant neoplasm, unspecified: Secondary | ICD-10-CM | POA: Diagnosis not present

## 2015-07-16 DIAGNOSIS — E869 Volume depletion, unspecified: Secondary | ICD-10-CM | POA: Diagnosis present

## 2015-07-16 DIAGNOSIS — M6281 Muscle weakness (generalized): Secondary | ICD-10-CM | POA: Diagnosis not present

## 2015-07-16 DIAGNOSIS — R109 Unspecified abdominal pain: Secondary | ICD-10-CM

## 2015-07-16 DIAGNOSIS — T502X5A Adverse effect of carbonic-anhydrase inhibitors, benzothiadiazides and other diuretics, initial encounter: Secondary | ICD-10-CM | POA: Diagnosis present

## 2015-07-16 DIAGNOSIS — R2243 Localized swelling, mass and lump, lower limb, bilateral: Secondary | ICD-10-CM | POA: Diagnosis not present

## 2015-07-16 DIAGNOSIS — D510 Vitamin B12 deficiency anemia due to intrinsic factor deficiency: Secondary | ICD-10-CM | POA: Diagnosis not present

## 2015-07-16 DIAGNOSIS — N390 Urinary tract infection, site not specified: Secondary | ICD-10-CM | POA: Diagnosis present

## 2015-07-16 DIAGNOSIS — Z87442 Personal history of urinary calculi: Secondary | ICD-10-CM | POA: Diagnosis not present

## 2015-07-16 DIAGNOSIS — I10 Essential (primary) hypertension: Secondary | ICD-10-CM | POA: Diagnosis present

## 2015-07-16 DIAGNOSIS — I82403 Acute embolism and thrombosis of unspecified deep veins of lower extremity, bilateral: Secondary | ICD-10-CM | POA: Diagnosis present

## 2015-07-16 DIAGNOSIS — D531 Other megaloblastic anemias, not elsewhere classified: Secondary | ICD-10-CM | POA: Diagnosis present

## 2015-07-16 DIAGNOSIS — D638 Anemia in other chronic diseases classified elsewhere: Secondary | ICD-10-CM | POA: Diagnosis present

## 2015-07-16 DIAGNOSIS — E861 Hypovolemia: Secondary | ICD-10-CM | POA: Diagnosis present

## 2015-07-16 DIAGNOSIS — R278 Other lack of coordination: Secondary | ICD-10-CM | POA: Diagnosis not present

## 2015-07-16 DIAGNOSIS — Z87440 Personal history of urinary (tract) infections: Secondary | ICD-10-CM | POA: Diagnosis not present

## 2015-07-16 DIAGNOSIS — I824Z2 Acute embolism and thrombosis of unspecified deep veins of left distal lower extremity: Secondary | ICD-10-CM | POA: Diagnosis not present

## 2015-07-16 DIAGNOSIS — I9589 Other hypotension: Secondary | ICD-10-CM | POA: Diagnosis not present

## 2015-07-16 DIAGNOSIS — Z043 Encounter for examination and observation following other accident: Secondary | ICD-10-CM | POA: Diagnosis not present

## 2015-07-16 DIAGNOSIS — N183 Chronic kidney disease, stage 3 unspecified: Secondary | ICD-10-CM | POA: Insufficient documentation

## 2015-07-16 DIAGNOSIS — R2242 Localized swelling, mass and lump, left lower limb: Secondary | ICD-10-CM | POA: Diagnosis not present

## 2015-07-16 DIAGNOSIS — R319 Hematuria, unspecified: Secondary | ICD-10-CM | POA: Diagnosis not present

## 2015-07-16 DIAGNOSIS — N19 Unspecified kidney failure: Secondary | ICD-10-CM

## 2015-07-16 DIAGNOSIS — R0602 Shortness of breath: Secondary | ICD-10-CM | POA: Diagnosis not present

## 2015-07-16 DIAGNOSIS — Z743 Need for continuous supervision: Secondary | ICD-10-CM | POA: Diagnosis not present

## 2015-07-16 HISTORY — DX: Other megaloblastic anemias, not elsewhere classified: D53.1

## 2015-07-16 HISTORY — DX: Sepsis, unspecified organism: A41.9

## 2015-07-16 HISTORY — DX: Acute embolism and thrombosis of unspecified deep veins of right lower extremity: I82.401

## 2015-07-16 HISTORY — DX: Encephalopathy, unspecified: G93.40

## 2015-07-16 HISTORY — DX: Calculus in bladder: N21.0

## 2015-07-16 HISTORY — DX: Presence of other vascular implants and grafts: Z95.828

## 2015-07-16 HISTORY — DX: Other pancytopenia: D61.818

## 2015-07-16 HISTORY — DX: Essential (primary) hypertension: I10

## 2015-07-16 HISTORY — DX: Benign prostatic hyperplasia without lower urinary tract symptoms: N40.0

## 2015-07-16 LAB — CBC WITH DIFFERENTIAL/PLATELET
Basophils Absolute: 0 10*3/uL (ref 0.0–0.1)
Basophils Relative: 0 %
Eosinophils Absolute: 0.1 10*3/uL (ref 0.0–0.7)
Eosinophils Relative: 1 %
HCT: 31.7 % — ABNORMAL LOW (ref 39.0–52.0)
Hemoglobin: 10.4 g/dL — ABNORMAL LOW (ref 13.0–17.0)
Lymphocytes Relative: 12 %
Lymphs Abs: 1.4 10*3/uL (ref 0.7–4.0)
MCH: 29 pg (ref 26.0–34.0)
MCHC: 32.8 g/dL (ref 30.0–36.0)
MCV: 88.3 fL (ref 78.0–100.0)
Monocytes Absolute: 0.8 10*3/uL (ref 0.1–1.0)
Monocytes Relative: 7 %
Neutro Abs: 9.2 10*3/uL — ABNORMAL HIGH (ref 1.7–7.7)
Neutrophils Relative %: 80 %
Platelets: 303 10*3/uL (ref 150–400)
RBC: 3.59 MIL/uL — ABNORMAL LOW (ref 4.22–5.81)
RDW: 14.3 % (ref 11.5–15.5)
WBC: 11.5 10*3/uL — ABNORMAL HIGH (ref 4.0–10.5)

## 2015-07-16 LAB — RETICULOCYTES
RBC.: 3.58 MIL/uL — ABNORMAL LOW (ref 4.22–5.81)
RETIC COUNT ABSOLUTE: 32.2 10*3/uL (ref 19.0–186.0)
Retic Ct Pct: 0.9 % (ref 0.4–3.1)

## 2015-07-16 LAB — I-STAT CG4 LACTIC ACID, ED: Lactic Acid, Venous: 1.2 mmol/L (ref 0.5–2.0)

## 2015-07-16 LAB — VITAMIN B12: VITAMIN B 12: 641 pg/mL (ref 180–914)

## 2015-07-16 LAB — COMPREHENSIVE METABOLIC PANEL WITH GFR
ALT: 25 U/L (ref 17–63)
AST: 34 U/L (ref 15–41)
Albumin: 3.3 g/dL — ABNORMAL LOW (ref 3.5–5.0)
Alkaline Phosphatase: 66 U/L (ref 38–126)
Anion gap: 12 (ref 5–15)
BUN: 46 mg/dL — ABNORMAL HIGH (ref 6–20)
CO2: 22 mmol/L (ref 22–32)
Calcium: 8.4 mg/dL — ABNORMAL LOW (ref 8.9–10.3)
Chloride: 99 mmol/L — ABNORMAL LOW (ref 101–111)
Creatinine, Ser: 8.62 mg/dL — ABNORMAL HIGH (ref 0.61–1.24)
GFR calc Af Amer: 7 mL/min — ABNORMAL LOW
GFR calc non Af Amer: 6 mL/min — ABNORMAL LOW
Glucose, Bld: 123 mg/dL — ABNORMAL HIGH (ref 65–99)
Potassium: 3.2 mmol/L — ABNORMAL LOW (ref 3.5–5.1)
Sodium: 133 mmol/L — ABNORMAL LOW (ref 135–145)
Total Bilirubin: 0.8 mg/dL (ref 0.3–1.2)
Total Protein: 7.6 g/dL (ref 6.5–8.1)

## 2015-07-16 LAB — HEPATIC FUNCTION PANEL
ALBUMIN: 3.4 g/dL — AB (ref 3.5–5.0)
ALT: 25 U/L (ref 17–63)
AST: 34 U/L (ref 15–41)
Alkaline Phosphatase: 67 U/L (ref 38–126)
BILIRUBIN TOTAL: 0.8 mg/dL (ref 0.3–1.2)
Bilirubin, Direct: 0.2 mg/dL (ref 0.1–0.5)
Indirect Bilirubin: 0.6 mg/dL (ref 0.3–0.9)
Total Protein: 7.7 g/dL (ref 6.5–8.1)

## 2015-07-16 LAB — URINALYSIS, ROUTINE W REFLEX MICROSCOPIC
Glucose, UA: NEGATIVE mg/dL
Ketones, ur: NEGATIVE mg/dL
NITRITE: NEGATIVE
PH: 5.5 (ref 5.0–8.0)
Protein, ur: 100 mg/dL — AB
SPECIFIC GRAVITY, URINE: 1.025 (ref 1.005–1.030)
Urobilinogen, UA: 0.2 mg/dL (ref 0.0–1.0)

## 2015-07-16 LAB — URINE MICROSCOPIC-ADD ON

## 2015-07-16 LAB — IRON AND TIBC
Iron: 10 ug/dL — ABNORMAL LOW (ref 45–182)
SATURATION RATIOS: 5 % — AB (ref 17.9–39.5)
TIBC: 216 ug/dL — ABNORMAL LOW (ref 250–450)
UIBC: 206 ug/dL

## 2015-07-16 LAB — SODIUM, URINE, RANDOM: Sodium, Ur: 55 mmol/L

## 2015-07-16 LAB — FOLATE: Folate: >80 ng/mL (ref >5.9)

## 2015-07-16 LAB — FERRITIN: Ferritin: 239 ng/mL (ref 24–336)

## 2015-07-16 LAB — CREATININE, URINE, RANDOM: CREATININE, URINE: 252.5 mg/dL

## 2015-07-16 LAB — MRSA PCR SCREENING: MRSA by PCR: NEGATIVE

## 2015-07-16 MED ORDER — ACETAMINOPHEN 650 MG RE SUPP: 650.0000 mg | Freq: Four times a day (QID) | RECTAL | Status: DC | PRN

## 2015-07-16 MED ORDER — GABAPENTIN 100 MG PO CAPS
100.0000 mg | ORAL_CAPSULE | Freq: Every day | ORAL | Status: DC
  Administered 2015-07-16 – 2015-07-19 (×4): 100 mg via ORAL
  Filled 2015-07-16 (×4): qty 1

## 2015-07-16 MED ORDER — ACETAMINOPHEN 325 MG PO TABS: 650.0000 mg | ORAL_TABLET | Freq: Four times a day (QID) | ORAL | Status: DC | PRN

## 2015-07-16 MED ORDER — CLONAZEPAM 0.5 MG PO TABS
0.5000 mg | ORAL_TABLET | Freq: Every day | ORAL | Status: DC
  Administered 2015-07-16 – 2015-07-20 (×5): 0.5 mg via ORAL
  Filled 2015-07-16 (×5): qty 1

## 2015-07-16 MED ORDER — SODIUM CHLORIDE 0.9 % IV BOLUS (SEPSIS)
1000.0000 mL | Freq: Once | INTRAVENOUS | Status: AC
  Administered 2015-07-16: 1000 mL via INTRAVENOUS

## 2015-07-16 MED ORDER — PRENATAL 27-0.8 MG PO TABS
1.0000 | ORAL_TABLET | Freq: Every day | ORAL | Status: DC
  Administered 2015-07-17 – 2015-07-20 (×4): 1 via ORAL
  Filled 2015-07-16 (×6): qty 1

## 2015-07-16 MED ORDER — DOCUSATE SODIUM 100 MG PO CAPS
100.0000 mg | ORAL_CAPSULE | Freq: Two times a day (BID) | ORAL | Status: DC
Start: 2015-07-16 — End: 2015-07-20
  Administered 2015-07-16 – 2015-07-20 (×8): 100 mg via ORAL
  Filled 2015-07-16 (×8): qty 1

## 2015-07-16 MED ORDER — ALBUTEROL SULFATE (2.5 MG/3ML) 0.083% IN NEBU: 2.5000 mg | INHALATION_SOLUTION | RESPIRATORY_TRACT | Status: DC | PRN

## 2015-07-16 MED ORDER — ALUM & MAG HYDROXIDE-SIMETH 200-200-20 MG/5ML PO SUSP: 30.0000 mL | Freq: Four times a day (QID) | ORAL | Status: DC | PRN

## 2015-07-16 MED ORDER — ONDANSETRON HCL 4 MG PO TABS: 4.0000 mg | ORAL_TABLET | Freq: Four times a day (QID) | ORAL | Status: DC | PRN

## 2015-07-16 MED ORDER — SODIUM CHLORIDE 0.9 % IV SOLN: INTRAVENOUS | Status: DC

## 2015-07-16 MED ORDER — FOLIC ACID 1 MG PO TABS
1.0000 mg | ORAL_TABLET | Freq: Every day | ORAL | Status: DC
  Administered 2015-07-16 – 2015-07-20 (×5): 1 mg via ORAL
  Filled 2015-07-16 (×5): qty 1

## 2015-07-16 MED ORDER — CEFTRIAXONE SODIUM 1 G IJ SOLR
1.0000 g | INTRAMUSCULAR | Status: DC
  Administered 2015-07-16 – 2015-07-19 (×4): 1 g via INTRAVENOUS
  Filled 2015-07-16 (×6): qty 10

## 2015-07-16 MED ORDER — PANTOPRAZOLE SODIUM 40 MG PO TBEC
40.0000 mg | DELAYED_RELEASE_TABLET | Freq: Every day | ORAL | Status: DC
  Administered 2015-07-16 – 2015-07-20 (×5): 40 mg via ORAL
  Filled 2015-07-16 (×5): qty 1

## 2015-07-16 MED ORDER — ONDANSETRON HCL 4 MG/2ML IJ SOLN
4.0000 mg | Freq: Four times a day (QID) | INTRAMUSCULAR | Status: DC | PRN
  Administered 2015-07-18: 4 mg via INTRAVENOUS
  Filled 2015-07-16: qty 2

## 2015-07-16 MED ORDER — POTASSIUM CHLORIDE IN NACL 20-0.9 MEQ/L-% IV SOLN
INTRAVENOUS | Status: DC
  Administered 2015-07-16: 1000 mL via INTRAVENOUS
  Administered 2015-07-17 – 2015-07-19 (×5): via INTRAVENOUS
  Administered 2015-07-19: 75 mL/h via INTRAVENOUS

## 2015-07-16 MED ORDER — HYDROCODONE-ACETAMINOPHEN 5-325 MG PO TABS: 1.0000 | ORAL_TABLET | Freq: Four times a day (QID) | ORAL | Status: DC | PRN

## 2015-07-16 MED ORDER — ENSURE ENLIVE PO LIQD
237.0000 mL | Freq: Two times a day (BID) | ORAL | Status: DC
  Administered 2015-07-17 – 2015-07-20 (×5): 237 mL via ORAL

## 2015-07-16 MED ORDER — TAMSULOSIN HCL 0.4 MG PO CAPS
0.4000 mg | ORAL_CAPSULE | Freq: Every day | ORAL | Status: DC
  Administered 2015-07-16 – 2015-07-20 (×5): 0.4 mg via ORAL
  Filled 2015-07-16 (×5): qty 1

## 2015-07-16 MED ORDER — GUAIFENESIN-DM 100-10 MG/5ML PO SYRP: 5.0000 mL | ORAL_SOLUTION | ORAL | Status: DC | PRN

## 2015-07-16 NOTE — H&P (Signed)
[facility] History and Physical  Wiley Magan ZOX:[id number] DOB: Jun 13, 1951 DOA: 07/16/2015  Referring physician: E physician, Dr. Estell Harpin PCP: Lynwood Dawley, PA-C   Chief Complaint: Generalized weakness  HPI: Henok Heacock is a 64 y.o. male with a history of right lower extremity DVT, status post IVC filter due to hematuria on Eliquis, vitamin B12 deficiency, bladder calculus, hypertension, who presents to the emergency department with a chief complaint of generalized weakness. He has had generalized weakness for the past 2 or 3 days. He fell one occasion at home, but he denies head injury. He has had one episode of subjective fever, but no chills. He has had no headache, dizziness, nausea, vomiting or diarrhea. He denies chest pain, shortness of breath, or chest congestion. He denies abdominal pain, flank pain, pain with urination, diarrhea, black tarry stools, bright red blood per rectum, or blood in his urine. He reports not urinating for approximately 24 hours. He denies bladder pain. He reports a poor intake of both fluids and solid foods. He denies dysphagia-like symptoms. He states "I just feel like eating". He has had no changes in his medications. He denies taking NSAID type medications.  In the ED, he was hypotensive with a blood pressure of 72/47 initially. His chest x-ray reveals no acute cardiopulmonary disease.  His lab data are significant for serum sodium of 133, potassium 3.2, BUN 46, creatinine 8.62, glucose 123, normal lactic acid, WBC 11.5, hemoglobin 10.4. His urinalysis revealed many bacteria, too numerous to count WBCs, many squamous cells, and small leukocytes. He is being admitted for further evaluation and management.     Review of Systems:  He has had swelling in both legs over the past week. He has had a poor appetite. As above in history present illness. Otherwise review of systems is negative.  Past Medical History  Diagnosis Date  . Encephalopathy   . UTI  (lower urinary tract infection)   . Renal disorder     Bladder stone,Bladder diverticulm  . Hx of sepsis 12/15  . Peripheral vascular disease 10/11/14    acute embolism and thrombosis Rt femoral vein  . Dysphagia 10/11/14  . Blood dyscrasia     pancytopenia  . Anemia     nutritional anemia  . Anemia     posthemorrhagic anemia  . Neuromuscular disorder     muscle weakness  . Failure to thrive in adult 08/22/14  . S/P IVC filter 10/05/2014    Sec to hematuria on Eliquis  . Right leg DVT 08/14/2014    R. femoral vein  . Pancytopenia 08/14/2014  . Megaloblastic anemia due to B12 deficiency 08/15/2014  . Bladder calculus 10/06/2014  . Essential hypertension 01/31/2015  . BPH (benign prostatic hyperplasia) 01/31/2015  . Sepsis 10/05/2014  . Acute encephalopathy 08/14/2014   Past Surgical History  Procedure Laterality Date  . Cystoscopy N/A 08/21/2014    Procedure: CYSTOSCOPY FLEXIBLE;  Surgeon: Chelsea Aus, MD;  Location: [facility];  Service: Urology;  Laterality: N/A;  at bedside  . Cystoscopy with litholapaxy N/A 11/22/2014    Procedure: CYSTOSCOPY WITH LITHOLAPAXY;  Surgeon: Chelsea Aus, MD;  Location: [facility];  Service: Urology;  Laterality: N/A;  . Transurethral incision of prostate N/A 11/22/2014    Procedure: TRANSURETHRAL INCISION OF THE PROSTATE (TUIP);  Surgeon: Chelsea Aus, MD;  Location: [facility];  Service: Urology;  Laterality: N/A;   Social History: Patient is single. He lives in [place]. He has one son. He is retired. He smokes  cigars on occasion. He denies alcohol and illicit drug use.  No Known Allergies  Family History  Problem Relation Age of Onset  . Cancer Sister    His mother died of Alzheimer's disease. His father died of some type of heart condition.  Prior to Admission medications   Medication Sig Start Date End Date Taking? Authorizing Provider  folic acid (FOLVITE) 1 MG tablet Take 1 tablet (1 mg total) by mouth daily. 03/08/15  Yes  Tiffany A Gann, PA-C  gabapentin (NEURONTIN) 100 MG capsule Take 1 capsule (100 mg total) by mouth 3 (three) times daily. 02/04/15  Yes Tiffany A Gann, PA-C  HYDROcodone-acetaminophen (NORCO/VICODIN) 5-325 MG per tablet Take 1 tablet by mouth every 6 (six) hours as needed for moderate pain. 11/26/14  Yes Tiffany L Reed, DO  lisinopril-hydrochlorothiazide (PRINZIDE,ZESTORETIC) 10-12.5 MG per tablet Take 1 tablet by mouth daily. 05/21/15  Yes Tiffany A Gann, PA-C  tamsulosin (FLOMAX) 0.4 MG CAPS capsule Take 1 capsule (0.4 mg total) by mouth daily. 02/04/15  Yes Tiffany A Gann, PA-C  Cholecalciferol (VITAMIN D3) 3000 UNITS TABS Take 1 tablet by mouth daily. Patient not taking: Reported on 07/16/2015 02/04/15   Tiffany A Gann, PA-C  ciprofloxacin (CIPRO) 500 MG tablet Take 1 tablet (500 mg total) by mouth 2 (two) times daily. Patient not taking: Reported on 06/17/2015 05/21/15   Tiffany A Gann, PA-C  clonazePAM (KLONOPIN) 0.5 MG tablet Take 1 tablet (0.5 mg total) by mouth daily. 02/04/15   Tiffany A Gann, PA-C  nitrofurantoin, macrocrystal-monohydrate, (MACROBID) 100 MG capsule Take 1 capsule (100 mg total) by mouth 2 (two) times daily. Patient not taking: Reported on 07/16/2015 05/24/15   Tiffany A Gann, PA-C  pantoprazole (PROTONIX) 40 MG tablet Take 1 tablet (40 mg total) by mouth daily. 02/04/15   Tiffany A Gann, PA-C  Prenatal Vit-Fe Fumarate-FA (MULTIVITAMIN-PRENATAL) 27-0.8 MG TABS tablet Take 1 tablet by mouth daily at 12 noon. 02/04/15   Chelsea Primus, PA-C   Physical Exam: Filed Vitals:   07/16/15 1413 07/16/15 1431 07/16/15 1544 07/16/15 1545  BP: 91/60 108/57  89/49  Pulse:    74  Temp:      TempSrc:      Resp: Height:    (1.702 m)   Weight:   88.8 kg (195 lb 12.3 oz)   SpO2: 100%   100%    Wt Readings from Last 3 Encounters:  07/16/15 88.8 kg (195 lb 12.3 oz)  06/17/15 86.682 kg (191 lb 1.6 oz)  05/21/15 88.361 kg (194 lb 12.8 oz)    General:  Appears calm and  comfortable; alert pleasant 64 year old man in no acute distress. Eyes: PERRL, normal lids, irises & conjunctiva; conjunctivae are clear and sclerae are white. ENT: Hearing is grossly normal. Oropharynx reveals pale mucous membranes; dry mucous midbrain; multiple missing teeth and the ones remaining demonstrate caries; no posterior exudates or erythema. Neck: no LAD, masses or thyromegaly Cardiovascular:  S1, S2, no murmurs rubs or gallops. 1+ bilateral lower extremity edema, mildly pitting. Telemetry: SR, no arrhythmias  Respiratory: CTA bilaterally, no w/r/r. Normal respiratory effort. Abdomen: Mildly obese, positive bowel sounds, soft, nontender, nondistended. Skin: no rash or induration seen on limited exam Musculoskeletal: grossly normal tone BUE/BLE; no acute hot red joints. Psychiatric: grossly normal mood and affect, speech fluent and appropriate Neurologic: grossly non-focal. cranial nerves II through XII are grossly intact.           Labs on Admission:  Basic Metabolic Panel:  Recent Labs Lab 07/16/15 1255  NA 133*  K 3.2*  CL 99*  CO2 22  GLUCOSE 123*  BUN 46*  CREATININE 8.62*  CALCIUM 8.4*   Liver Function Tests:  Recent Labs Lab 07/16/15 1255  AST 34  ALT 25  ALKPHOS 66  BILITOT 0.8  PROT 7.6  ALBUMIN 3.3*   No results for input(s): LIPASE, AMYLASE in the last 168 hours. No results for input(s): AMMONIA in the last 168 hours. CBC:  Recent Labs Lab 07/16/15 1255  WBC 11.5*  NEUTROABS 9.2*  HGB 10.4*  HCT 31.7*  MCV 88.3  PLT 303   Cardiac Enzymes: No results for input(s): CKTOTAL, CKMB, CKMBINDEX, TROPONINI in the last 168 hours.  BNP (last 3 results) No results for input(s): BNP in the last 8760 hours.  ProBNP (last 3 results)  Recent Labs  08/13/14 2224  PROBNP 82.8    CBG: No results for input(s): GLUCAP in the last 168 hours.  Radiological Exams on Admission: Dg Chest Portable 1 View  07/16/2015   CLINICAL DATA:  Fall several  days ago.  EXAM: PORTABLE CHEST - 1 VIEW  COMPARISON:  10/05/2014  FINDINGS: The heart size appears enlarged. There is no pleural or pericardial effusion identified. Both lungs are clear. The visualized skeletal structures are unremarkable.  IMPRESSION: No active disease.   Electronically Signed   By: Signa Kell M.D.   On: 07/16/2015 14:02    EKG: Independently reviewed.   Assessment/Plan Principal Problem:   Generalized weakness Active Problems:   Acute kidney injury   Volume depletion   Hypotension   Megaloblastic anemia due to B12 deficiency   Hypokalemia   Bilateral lower extremity edema   AKI (acute kidney injury)   1. Acute kidney injury. The patient's BUN is 46 and his creatinine is 8.62. On 06/17/15 his creatinine was within normal limits at 1.17. The etiology of his acute kidney injury is unclear, but it is likely secondary to prerenal azotemia/dehydration in the setting of ACE inhibitor and diuretic therapy. He denies diarrhea or vomiting recently. However, he has had poor oral intake over the past few days. He has a history of bladder stones, but he reports no evidence of bladder stones from his recent visit with his urologist. Nephrologist, Dr. Wolfgang Phoenix has evaluated the patient. Will order renal ultrasound. We'll hold lisinopril/HCTZ. Patient received 2 L of normal saline in the ED. Will start vigorous IV fluids with normal saline. We'll continue to monitor his renal function. A Foley catheter was inserted for strict ins and outs. 2. Pyuria and bacteriuria. Patient has a history of UTI and has been treated with Cipro and suppressive therapy with nitrofurantoin, but the patient is unclear if he is still taking it. Given the patient's symptomatology, we will treat this as a UTI. Will culture the urine. We'll start Rocephin empirically. 3. Hypotension. The patient's blood pressure was in the 70s systolically in the ED. With IV fluid hydration, his blood pressure improved to the 90s  systolically. His hypotension is likely from hypovolemia. As above, will hold HCTZ. Will continue vigorous IV fluid hydration. 4. Hypokalemia. Patient's serum potassium is 3.2 on admission. Will supplement orally, but cautiously. Will add potassium chloride to the IV fluids. The patient denies diarrhea or vomiting. The hypokalemia may be secondary to HCTZ. 5. Mild hyponatremia. The patient's serum sodium is 133 on admission. This is likely result of hypovolemia. Will give IV fluids with normal saline as above. 6. Bilateral  lower extremity edema. The patient reports intermittent swelling in his legs, a little worse than usual. The edema may be secondary to acute kidney injury. Nevertheless, will order lower extremity venous Doppler ultrasound to rule out DVT. Of note, the patient has a history of right lower extremity DVT-status post IVC filter. Eliquis was apparently discontinued when he developed hematuria. DVT prophylaxis will be ordered with STDs. 7. 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 The patient is followed by hematology clinic. He receives vitamin B12 injections on a regular basis. Anemia studies were ordered by nephrology. Will monitor.   Code Status: Full code DVT Prophylaxis: SCDs Family Communication: Discussed with patient's brother with permission. Disposition Plan: Anticipate discharge in 3-5 days.  Time spent: One hour.  [facility] Pager [phone]

## 2015-07-16 NOTE — ED Notes (Signed)
Patient reports he hasn't been "peeing as much as usual".

## 2015-07-16 NOTE — Consult Note (Addendum)
Edwin [name] MRN: [id number] DOB/AGE: Jan 13, 1951 64 y.o. Primary Care Physician:Gann, Elmarie Shiley, PA-C Admit date: 07/16/2015 Chief Complaint:  Chief Complaint  Patient presents with  . Fall   HPI: Pt is 64 year old male with past medical hx of  DVT, Anemia who presented to ER with c/o of fall.  HPI dates back to this am when pt had mechanical fall. Pt vsays he ahs been feeling weak from past few days and was still taking his bp medication. NO c/o chest pain NO c/o dyspnea NO c/o fever/cough/chills NO c/o nausea/vomiting/abdominal pain NO c/o syncope NO c/o freq /urgency/dysuria NO c/o OTC NSAIDS use  Past Medical History  Diagnosis Date  . Encephalopathy   . UTI (lower urinary tract infection)   . Renal disorder     Bladder stone,Bladder diverticulm  . Hx of sepsis 12/15  . Peripheral vascular disease 10/11/14    acute embolism and thrombosis Rt femoral vein  . Dysphagia 10/11/14  . Blood dyscrasia     pancytopenia  . Anemia     nutritional anemia  . Anemia     posthemorrhagic anemia  . Neuromuscular disorder     muscle weakness  . Failure to thrive in adult 08/22/14       Family History  Problem Relation Age of Onset  . Cancer Sister   NO hx of ESRD  Social History:  reports that he has been smoking Cigars.  He has never used smokeless tobacco. He reports that he does not drink alcohol or use illicit drugs.   Allergies: No Known Allergies  Medications Prior to Admission  Medication Sig Dispense Refill  . clonazePAM (KLONOPIN) 0.5 MG tablet Take 1 tablet (0.5 mg total) by mouth daily. 30 tablet 3  . folic acid (FOLVITE) 1 MG tablet Take 1 tablet (1 mg total) by mouth daily. 30 tablet 3  . gabapentin (NEURONTIN) 100 MG capsule Take 1 capsule (100 mg total) by mouth 3 (three) times daily. 90 capsule 6  . HYDROcodone-acetaminophen (NORCO/VICODIN) 5-325 MG per tablet Take 1 tablet by mouth every 6 (six) hours as needed for moderate pain. 120 tablet 0  .  lisinopril-hydrochlorothiazide (PRINZIDE,ZESTORETIC) 10-12.5 MG per tablet Take 1 tablet by mouth daily. 30 tablet 5  . pantoprazole (PROTONIX) 40 MG tablet Take 1 tablet (40 mg total) by mouth daily. 30 tablet 6  . Prenatal Vit-Fe Fumarate-FA (MULTIVITAMIN-PRENATAL) 27-0.8 MG TABS tablet Take 1 tablet by mouth daily at 12 noon. 30 tablet 6  . tamsulosin (FLOMAX) 0.4 MG CAPS capsule Take 1 capsule (0.4 mg total) by mouth daily. 30 capsule 6  . Cholecalciferol (VITAMIN D3) 3000 UNITS TABS Take 1 tablet by mouth daily. (Patient not taking: Reported on 07/16/2015) 30 tablet 6  . ciprofloxacin (CIPRO) 500 MG tablet Take 1 tablet (500 mg total) by mouth 2 (two) times daily. (Patient not taking: Reported on 06/17/2015) 20 tablet 0  . nitrofurantoin, macrocrystal-monohydrate, (MACROBID) 100 MG capsule Take 1 capsule (100 mg total) by mouth 2 (two) times daily. (Patient not taking: Reported on 07/16/2015) 20 capsule 0       WUJ:WJXBJ from the symptoms mentioned above,there are no other symptoms referable to all systems reviewed.  Physical Exam: Vital signs in last 24 hours: Temp:  [98.1 F (36.7 C)] 98.1 F (36.7 C) (09/20 1238) Pulse Rate:  [83-102] 83 (09/20 1400) Resp:  [15-28] 28 (09/20 1431) BP: (72-108)/(47-60) 108/57 mmHg (09/20 1431) SpO2:  [94 %-100 %] 100 % (09/20 1413) Weight:  [180 lb (81.647  kg)] 180 lb (81.647 kg) (09/20 1238) Weight change:      Physical Exam: General- pt is awake,alert, follows comands  Resp- No acute REsp distress, CTA B/L NO Rhonchi CVS- S1S2 regular in rate and rhythm GIT- BS+, soft, NT, ND EXT- NO LE Edema, Cyanosis CNS- CN 2-12 grossly intact. Moving all 4 extremities Psych- normal mood and affect    Lab Results: CBC  Recent Labs  07/16/15 1255  WBC 11.5*  HGB 10.4*  HCT 31.7*  PLT 303    BMET  Recent Labs  07/16/15 1255  NA 133*  K 3.2*  CL 99*  CO2 22  GLUCOSE 123*  BUN 46*  CREATININE 8.62*  CALCIUM 8.4*    MICRO No  results found for this or any previous visit (from the past 240 hour(s)).    Lab Results  Component Value Date   CALCIUM 8.4* 07/16/2015   PHOS 2.1* 08/17/2014      Impression: 1)Renal  AKI secondary to prernal/ATN/post renal                Data in favor of Prerenal/ATN                    Hypotension                    ACE                    HCTZ                       Data in favor of post renal                   Hx of bladder stone               2)CVS-Hypotensive  Pt has  Hx of HTN Medication as outpt- Pt was on On RAS blockers On Diuretics    3)Anemia HGb stable  4)DVT- hx of DVT      S/p IVC filter placement   5)Urology-Pt with hx of bladders stone  PMD following  6)Electrolytes  Hypokalemic  Hyponatremic     Hypovolemic Hyponatremia     HCTZ  7)Acid base Co2 at goal     Plan:  Will suggest to hold ace Will sugegst to hold HCTZ Will suggest to ask for REnal U/s Will suggest to start IVF NS  Will sugegst to get 2d echo Will ask for FENA  I educated pt about his kidney issues and possible need of HD.  BHUTANI,MANPREET S 07/16/2015, 3:39 PM

## 2015-07-16 NOTE — ED Provider Notes (Signed)
CSN: [id number]     Arrival date & time 07/16/15  1233 History   First MD Initiated Contact with Patient 07/16/15 1243     Chief Complaint  Patient presents with  . Fall     (Consider location/radiation/quality/duration/timing/severity/associated sxs/prior Treatment) Patient is a 64 y.o. male presenting with weakness. The history is provided by the patient (Patient states that he has felt weak for a couple days. And he fell. Patient has pain in right knee. Patient did not hit head.).  Weakness This is a new problem. The current episode started 1 to 2 hours ago. The problem occurs constantly. The problem has not changed since onset.Pertinent negatives include no chest pain, no abdominal pain and no headaches. Nothing aggravates the symptoms. Nothing relieves the symptoms.    Past Medical History  Diagnosis Date  . Encephalopathy   . UTI (lower urinary tract infection)   . Renal disorder     Bladder stone,Bladder diverticulm  . Hx of sepsis 12/15  . Peripheral vascular disease 10/11/14    acute embolism and thrombosis Rt femoral vein  . Dysphagia 10/11/14  . Blood dyscrasia     pancytopenia  . Anemia     nutritional anemia  . Anemia     posthemorrhagic anemia  . Neuromuscular disorder     muscle weakness  . Failure to thrive in adult 08/22/14   Past Surgical History  Procedure Laterality Date  . Cystoscopy N/A 08/21/2014    Procedure: CYSTOSCOPY FLEXIBLE;  Surgeon: Chelsea Aus, MD;  Location: [facility];  Service: Urology;  Laterality: N/A;  at bedside  . Cystoscopy with litholapaxy N/A 11/22/2014    Procedure: CYSTOSCOPY WITH LITHOLAPAXY;  Surgeon: Chelsea Aus, MD;  Location: [facility];  Service: Urology;  Laterality: N/A;  . Transurethral incision of prostate N/A 11/22/2014    Procedure: TRANSURETHRAL INCISION OF THE PROSTATE (TUIP);  Surgeon: Chelsea Aus, MD;  Location: [facility];  Service: Urology;  Laterality: N/A;   Family History  Problem Relation Age of  Onset  . Cancer Sister    Social History  Substance Use Topics  . Smoking status: Light Tobacco Smoker    Types: Cigars  . Smokeless tobacco: Never Used  . Alcohol Use: No    Review of Systems  Constitutional: Negative for appetite change and fatigue.  HENT: Negative for congestion, ear discharge and sinus pressure.   Eyes: Negative for discharge.  Respiratory: Negative for cough.   Cardiovascular: Negative for chest pain.  Gastrointestinal: Negative for abdominal pain and diarrhea.  Genitourinary: Negative for frequency and hematuria.  Musculoskeletal: Negative for back pain.  Skin: Negative for rash.  Neurological: Positive for weakness. Negative for seizures and headaches.  Psychiatric/Behavioral: Negative for hallucinations.      Allergies  Review of patient's allergies indicates no known allergies.  Home Medications   Prior to Admission medications   Medication Sig Start Date End Date Taking? Authorizing Provider  clonazePAM (KLONOPIN) 0.5 MG tablet Take 1 tablet (0.5 mg total) by mouth daily. 02/04/15  Yes Tiffany A Gann, PA-C  folic acid (FOLVITE) 1 MG tablet Take 1 tablet (1 mg total) by mouth daily. 03/08/15  Yes Tiffany A Gann, PA-C  gabapentin (NEURONTIN) 100 MG capsule Take 1 capsule (100 mg total) by mouth 3 (three) times daily. 02/04/15  Yes Tiffany A Gann, PA-C  HYDROcodone-acetaminophen (NORCO/VICODIN) 5-325 MG per tablet Take 1 tablet by mouth every 6 (six) hours as needed for moderate pain. 11/26/14  Yes Tiffany Alroy Dust,  DO  lisinopril-hydrochlorothiazide (PRINZIDE,ZESTORETIC) 10-12.5 MG per tablet Take 1 tablet by mouth daily. 05/21/15  Yes Tiffany A Gann, PA-C  pantoprazole (PROTONIX) 40 MG tablet Take 1 tablet (40 mg total) by mouth daily. 02/04/15  Yes Tiffany A Gann, PA-C  Prenatal Vit-Fe Fumarate-FA (MULTIVITAMIN-PRENATAL) 27-0.8 MG TABS tablet Take 1 tablet by mouth daily at 12 noon. 02/04/15  Yes Tiffany A Gann, PA-C  tamsulosin (FLOMAX) 0.4 MG CAPS capsule  Take 1 capsule (0.4 mg total) by mouth daily. 02/04/15  Yes Tiffany A Gann, PA-C  Cholecalciferol (VITAMIN D3) 3000 UNITS TABS Take 1 tablet by mouth daily. Patient not taking: Reported on 07/16/2015 02/04/15   Tiffany A Gann, PA-C  ciprofloxacin (CIPRO) 500 MG tablet Take 1 tablet (500 mg total) by mouth 2 (two) times daily. Patient not taking: Reported on 06/17/2015 05/21/15   Tiffany A Gann, PA-C  nitrofurantoin, macrocrystal-monohydrate, (MACROBID) 100 MG capsule Take 1 capsule (100 mg total) by mouth 2 (two) times daily. Patient not taking: Reported on 07/16/2015 05/24/15   Tiffany A Gann, PA-C   BP 91/60 mmHg  Pulse 83  Temp(Src) 98.1 F (36.7 C) (Oral)  Resp 15  Ht  (1.702 m)  Wt 180 lb (81.647 kg)  BMI 28.19 kg/m2  SpO2 100% Physical Exam  Constitutional: He is oriented to person, place, and time. He appears well-developed.  HENT:  Head: Normocephalic.  Eyes: Conjunctivae and EOM are normal. No scleral icterus.  Neck: Neck supple. No thyromegaly present.  Cardiovascular: Normal rate and regular rhythm.  Exam reveals no gallop and no friction rub.   No murmur heard. Pulmonary/Chest: No stridor. He has no wheezes. He has no rales. He exhibits no tenderness.  Abdominal: He exhibits no distension. There is no tenderness. There is no rebound.  Musculoskeletal: Normal range of motion. He exhibits no edema.  Minimal tender right knee  Lymphadenopathy:    He has no cervical adenopathy.  Neurological: He is oriented to person, place, and time. He exhibits normal muscle tone. Coordination normal.  Skin: No rash noted. No erythema.  Psychiatric: He has a normal mood and affect. His behavior is normal.    ED Course  Procedures (including critical care time) Labs Review Labs Reviewed  CBC WITH DIFFERENTIAL/PLATELET - Abnormal; Notable for the following:    WBC 11.5 (*)    RBC 3.59 (*)    Hemoglobin 10.4 (*)    HCT 31.7 (*)    Neutro Abs 9.2 (*)    All other components within  normal limits  COMPREHENSIVE METABOLIC PANEL - Abnormal; Notable for the following:    Sodium 133 (*)    Potassium 3.2 (*)    Chloride 99 (*)    Glucose, Bld 123 (*)    BUN 46 (*)    Creatinine, Ser 8.62 (*)    Calcium 8.4 (*)    Albumin 3.3 (*)    GFR calc non Af Amer 6 (*)    GFR calc Af Amer 7 (*)    All other components within normal limits  CULTURE, BLOOD (ROUTINE X 2)  CULTURE, BLOOD (ROUTINE X 2)  URINALYSIS, ROUTINE W REFLEX MICROSCOPIC (NOT AT [facility])  I-STAT CG4 LACTIC ACID, ED    Imaging Review Dg Chest Portable 1 View  07/16/2015   CLINICAL DATA:  Fall several days ago.  EXAM: PORTABLE CHEST - 1 VIEW  COMPARISON:  10/05/2014  FINDINGS: The heart size appears enlarged. There is no pleural or pericardial effusion identified. Both lungs are clear. The  visualized skeletal structures are unremarkable.  IMPRESSION: No active disease.   Electronically Signed   By: Signa Kell M.D.   On: 07/16/2015 14:02   I have personally reviewed and evaluated these images and lab results as part of my medical decision-making.   EKG Interpretation None     CRITICAL CARE Performed by: ZAMMIT,JOSEPH L Total critical care time:45 Critical care time was exclusive of separately billable procedures and treating other patients. Critical care was necessary to treat or prevent imminent or life-threatening deterioration. Critical care was time spent personally by me on the following activities: development of treatment plan with patient and/or surrogate as well as nursing, discussions with consultants, evaluation of patient's response to treatment, examination of patient, obtaining history from patient or surrogate, ordering and performing treatments and interventions, ordering and review of laboratory studies, ordering and review of radiographic studies, pulse oximetry and re-evaluation of patient's condition.   MDM   Final diagnoses:  Renal failure    Admit,  Acute renal failure and  hypotension.   [facility] admit,  Nephrology to see pt  The chart was scribed for me under my direct supervision.  I personally performed the history, physical, and medical decision making and all procedures in the evaluation of this patient.Bethann Berkshire, MD 07/16/15 [phone]

## 2015-07-16 NOTE — ED Notes (Addendum)
Pt reports fell several days ago and reports left knee pain ever since. No deformity noted. Pt denies loc or hitting head.

## 2015-07-17 ENCOUNTER — Inpatient Hospital Stay (HOSPITAL_COMMUNITY): Payer: Medicare Other

## 2015-07-17 DIAGNOSIS — D649 Anemia, unspecified: Secondary | ICD-10-CM

## 2015-07-17 DIAGNOSIS — I9589 Other hypotension: Secondary | ICD-10-CM

## 2015-07-17 LAB — RENAL FUNCTION PANEL
ANION GAP: 9 (ref 5–15)
Albumin: 2.7 g/dL — ABNORMAL LOW (ref 3.5–5.0)
BUN: 41 mg/dL — AB (ref 6–20)
CO2: 23 mmol/L (ref 22–32)
Calcium: 7.8 mg/dL — ABNORMAL LOW (ref 8.9–10.3)
Chloride: 105 mmol/L (ref 101–111)
Creatinine, Ser: 5.67 mg/dL — ABNORMAL HIGH (ref 0.61–1.24)
GFR calc Af Amer: 11 mL/min — ABNORMAL LOW (ref >60)
GFR calc non Af Amer: 9 mL/min — ABNORMAL LOW (ref >60)
GLUCOSE: 104 mg/dL — AB (ref 65–99)
POTASSIUM: 3.3 mmol/L — AB (ref 3.5–5.1)
Phosphorus: 4 mg/dL (ref 2.5–4.6)
SODIUM: 137 mmol/L (ref 135–145)

## 2015-07-17 LAB — CBC
HEMATOCRIT: 29.3 % — AB (ref 39.0–52.0)
HEMOGLOBIN: 9.5 g/dL — AB (ref 13.0–17.0)
MCH: 29.2 pg (ref 26.0–34.0)
MCHC: 32.4 g/dL (ref 30.0–36.0)
MCV: 90.2 fL (ref 78.0–100.0)
Platelets: 247 10*3/uL (ref 150–400)
RBC: 3.25 MIL/uL — ABNORMAL LOW (ref 4.22–5.81)
RDW: 14.4 % (ref 11.5–15.5)
WBC: 7.6 10*3/uL (ref 4.0–10.5)

## 2015-07-17 LAB — PROTIME-INR
INR: 1.22 (ref 0.00–1.49)
Prothrombin Time: 15.5 seconds — ABNORMAL HIGH (ref 11.6–15.2)

## 2015-07-17 LAB — TSH: TSH: 0.347 u[IU]/mL — ABNORMAL LOW (ref 0.350–4.500)

## 2015-07-17 MED ORDER — HEPARIN (PORCINE) IN NACL 100-0.45 UNIT/ML-% IJ SOLN
1900.0000 [IU]/h | INTRAMUSCULAR | Status: DC
  Administered 2015-07-17: 1300 [IU]/h via INTRAVENOUS
  Administered 2015-07-18 (×2): 1600 [IU]/h via INTRAVENOUS
  Filled 2015-07-17 (×3): qty 250

## 2015-07-17 MED ORDER — HEPARIN BOLUS VIA INFUSION
4000.0000 [IU] | Freq: Once | INTRAVENOUS | Status: AC
  Administered 2015-07-17: 4000 [IU] via INTRAVENOUS
  Filled 2015-07-17: qty 4000

## 2015-07-17 MED ORDER — DEXTROSE 5 % IV SOLN
INTRAVENOUS | Status: AC
  Filled 2015-07-17: qty 10

## 2015-07-17 MED ORDER — CYANOCOBALAMIN 1000 MCG/ML IJ SOLN
1000.0000 ug | INTRAMUSCULAR | Status: DC
  Administered 2015-07-17: 1000 ug via INTRAMUSCULAR

## 2015-07-17 MED ORDER — POTASSIUM CHLORIDE CRYS ER 20 MEQ PO TBCR
20.0000 meq | EXTENDED_RELEASE_TABLET | Freq: Two times a day (BID) | ORAL | Status: DC
  Administered 2015-07-17 (×2): 20 meq via ORAL
  Filled 2015-07-17 (×2): qty 1

## 2015-07-17 NOTE — Clinical Documentation Improvement (Signed)
Internal Medicine  Based on the clinical findings below, please render an opinion and document findings in next progress note and include in discharge summary if applicable.   Other  Clinically Undetermined  Supporting Information:  Prolonged hypotension resulting in ARF with ATN  Patient tachycardic with HR > 90  BP's running 72/47, 84/48 with Maps ranging in mid 50's to 60's  Hypovolemic documented  Fluid resuscitation initiated  Please exercise your independent, professional judgment when responding. A specific answer is not anticipated or expected.  Thank You, Shellee Milo BSN, RN Health Information Management [place] [phone]

## 2015-07-17 NOTE — Progress Notes (Signed)
Got a call from Radiologist that patient has bilateral lower extremity DVT on ultrasound. Will start heparin per pharmacy consultation.

## 2015-07-17 NOTE — Progress Notes (Signed)
Nutrition Brief Note  Patient identified on the Malnutrition Screening Tool (MST) Report  Wt Readings from Last 15 Encounters:  07/17/15 199 lb 8.3 oz (90.5 kg)  06/17/15 191 lb 1.6 oz (86.682 kg)  05/21/15 194 lb 12.8 oz (88.361 kg)  03/08/15 194 lb (87.998 kg)  01/31/15 207 lb (93.895 kg)  12/13/14 195 lb 1.6 oz (88.497 kg)  11/22/14 187 lb (84.823 kg)  10/05/14 170 lb 10.2 oz (77.4 kg)  09/12/14 162 lb 1.6 oz (73.528 kg)  08/22/14 173 lb 15.1 oz (78.9 kg)    Body mass index is 31.24 kg/(m^2). Patient meets criteria for obesity class I based on current BMI. Weight gain of 4.6% in past month. Lower extremity edema likely contributing to wt increase.  Current diet order is Heart Healthy, patient is consuming approximately 75% of meals at this time. He reports poor appetite prior to admission which seems to have resolved now and wt hx is not reflective of inadequate intake.  Labs and medications reviewed.   No nutrition interventions warranted at this time. If nutrition issues arise, please consult RD.    Royann Shivers MS,RD,CSG,LDN Office: [phone] Pager: [phone]

## 2015-07-17 NOTE — Progress Notes (Signed)
[facility] PROGRESS NOTE  Edwin [name] JYN:[id number] DOB: [birth year] DOA: 07/16/2015 PCP: Lynwood Dawley, PA-C    Code Status: Full code Family Communication: Discussed with patient's brother on 9/20 Disposition Plan: Discharge when clinically appropriate   Consultants:  Nephrology  Procedures:  None  Antibiotics:  Rocephin 9/20>>  HPI/Subjective: Patient says Edwin [name] feels about the same. Edwin [name] ate all of his full liquid breakfast. Edwin [name] reports a poor appetite over the past several weeks. Edwin [name] continues to deny diarrhea and vomiting.  Objective: Filed Vitals:   07/17/15 0844  BP:   Pulse:   Temp: 99 F (37.2 C)  Resp:    blood pressure 94/51. Heart rate 73. Respiratory rate 21. Oxygen saturation 99%.  Intake/Output Summary (Last 24 hours) at 07/17/15 0944 Last data filed at 07/17/15 0848  Gross per 24 hour  Intake 2830.33 ml  Output   1975 ml  Net 855.33 ml   Filed Weights   07/16/15 1238 07/16/15 1544 07/17/15 0500  Weight: 81.647 kg (180 lb) 88.8 kg (195 lb 12.3 oz) 90.5 kg (199 lb 8.3 oz)    Exam:   General:  Pleasant 64 year old man, alert, in no acute distress.  Cardiovascular: S1, S2, no murmurs rubs or gallops. 1+ bilateral lower extremity edema.  Respiratory: Decreased breath sounds in the bases, otherwise clear.  Abdomen: Positive bowel sounds, soft, nontender, nondistended.  Musculoskeletal/extremities: No acute hot red joints. Bilateral lower extremity edema, 1+, mildly pitting.  Neurologic: Edwin [name] is alert and oriented 3. Cranial nerves II through XII are grossly intact.   Data Reviewed: Basic Metabolic Panel:  Recent Labs Lab 07/16/15 1255 07/17/15 0456  NA 133* 137  K 3.2* 3.3*  CL 99* 105  CO2 22 23  GLUCOSE 123* 104*  BUN 46* 41*  CREATININE 8.62* 5.67*  CALCIUM 8.4* 7.8*  PHOS  --  4.0   Liver Function Tests:  Recent Labs Lab 07/16/15 1255 07/17/15 0456  AST 34  34  --   ALT 25  25  --   ALKPHOS 67  66  --   BILITOT  0.8  0.8  --   PROT 7.7  7.6  --   ALBUMIN 3.4*  3.3* 2.7*   No results for input(s): LIPASE, AMYLASE in the last 168 hours. No results for input(s): AMMONIA in the last 168 hours. CBC:  Recent Labs Lab 07/16/15 1255 07/17/15 0456  WBC 11.5* 7.6  NEUTROABS 9.2*  --   HGB 10.4* 9.5*  HCT 31.7* 29.3*  MCV 88.3 90.2  PLT 303 247   Cardiac Enzymes: No results for input(s): CKTOTAL, CKMB, CKMBINDEX, TROPONINI in the last 168 hours. BNP (last 3 results) No results for input(s): BNP in the last 8760 hours.  ProBNP (last 3 results)  Recent Labs  08/13/14 2224  PROBNP 82.8    CBG: No results for input(s): GLUCAP in the last 168 hours.  Recent Results (from the past 240 hour(s))  MRSA PCR Screening     Status: None   Collection Time: 07/16/15  3:45 PM  Result Value Ref Range Status   MRSA by PCR NEGATIVE NEGATIVE Final    Comment:        The GeneXpert MRSA Assay (FDA approved for NASAL specimens only), is one component of a comprehensive MRSA colonization surveillance program. It is not intended to diagnose MRSA infection nor to guide or monitor treatment for MRSA infections.      Studies: US Renal  07/16/2015   CLINICAL DATA:  64 year old male with  acute kidney injury  EXAM: RENAL / URINARY TRACT ULTRASOUND COMPLETE  COMPARISON:  None.  FINDINGS: Right Kidney:  Length: 11.6 cm. Normal echogenicity of the parenchyma. No evidence of hydronephrosis. Anechoic cyst in the upper pole measures 16 x 20 x 17 mm. There is a solitary thin internal septation. Findings are most consistent with a minimally complex cyst.  Left Kidney:  Length: 10.8 cm. Parenchymal echogenicity is within normal limits. There is a mild hydronephrosis. No solid mass.  Bladder:  Decompressed with Foley catheter in place.  IMPRESSION: 1. Mild left hydronephrosis. 2. No evidence of right-sided hydronephrosis. 3. Minimally complex right renal cyst.   Electronically Signed   By: Malachy Moan M.D.   On:  07/16/2015 18:18   Dg Chest Portable 1 View  07/16/2015   CLINICAL DATA:  Fall several days ago.  EXAM: PORTABLE CHEST - 1 VIEW  COMPARISON:  10/05/2014  FINDINGS: The heart size appears enlarged. There is no pleural or pericardial effusion identified. Both lungs are clear. The visualized skeletal structures are unremarkable.  IMPRESSION: No active disease.   Electronically Signed   By: Signa Kell M.D.   On: 07/16/2015 14:02    Scheduled Meds: . cefTRIAXone (ROCEPHIN)  IV  1 g Intravenous Q24H  . clonazePAM  0.5 mg Oral Daily  . cyanocobalamin  1,000 mcg Intramuscular Weekly  . docusate sodium  100 mg Oral BID  . feeding supplement (ENSURE ENLIVE)  237 mL Oral BID BM  . folic acid  1 mg Oral Daily  . gabapentin  100 mg Oral QHS  . multivitamin-prenatal  1 tablet Oral Q1200  . pantoprazole  40 mg Oral Daily  . tamsulosin  0.4 mg Oral Daily   Continuous Infusions: . 0.9 % NaCl with KCl 20 mEq / L 170 mL/hr at 07/17/15 0056   Assessment and plan:  Principal Problem:   Generalized weakness Active Problems:   Acute kidney injury   UTI (urinary tract infection)   Volume depletion   Hypotension   Megaloblastic anemia due to B12 deficiency   Hypokalemia   Bilateral lower extremity edema   Normocytic anemia   1. Acute kidney injury.  In the ED, the patient's BUN was 46 and his creatinine was 8.62. On 06/17/15 his creatinine was within normal limits at 1.17.  The patient was started on vigorous IV fluids. HCTZ and lisinopril were withheld. A renal ultrasound was ordered and revealed only mild left hydronephrosis and a minimally complex right renal cyst; normal echogenicity. -Do not believe the hydronephrosis has contributed to his AK. - Nephrology was consulted and ordered additional labs. -With IV fluid hydration and holding HCTZ/lisinopril, his renal function has improved, consistent with prerenal azotemia, in the setting of diuretic and ACE inhibitor. Patient denied any history  of diarrhea or vomiting, but his oral intake has been marginal over the past several days. -Foley catheter was placed following admission. His urine output has been 1.9 L over the past 24 hours.   -We'll continue vigorous IV fluid hydration. -Await follow-up recommendations by nephrology.  Pyuria and bacteriuria, presumed UTI. - Patient has a history of UTI and has been treated with Cipro and suppressive therapy with nitrofurantoin, but the patient was unclear if Edwin [name] was still taking it. Given the patient's symptomatology, Edwin [name] was started on treatment for UTI with Rocephin. Urine culture is pending.  Hypotension.  The patient's blood pressure was in the 70s systolically in the ED. With IV fluid hydration, his blood pressure has improved. -  I believe the etiology of his hypotension was from hypovolemia in the setting of a diuretic therapy and poor oral intake. I doubt sepsis. We'll continue vigorous IV fluid hydration.  Hypokalemia.  Patient's serum potassium was 3.2 on admission, likely secondary to HCTZ. We'll continue to supplement orally cautiously in the setting of acute kidney injury. We'll continue potassium in the IV fluids.  Mild hyponatremia. The patient's serum sodium was 133 on admission, likely secondary to hypovolemia. -His serum sodium has improved on normal saline.   Bilateral lower extremity edema.  The patient reported intermittent swelling in his legs, a little worse than usual. The edema may be secondary to acute kidney injury. Nevertheless, will order lower extremity venous Doppler ultrasound to rule out DVT. Of note, the patient has a history of right lower extremity DVT-status post IVC filter. Eliquis was apparently discontinued when Edwin [name] developed hematuria. DVT prophylaxis will be ordered with SCDs. As his renal function improves, will transition to Lovenox.   Pernicious anemia/ vitamin B12 deficiency.  The patient has a long-standing history of anemia, secondary to vitamin  B12 deficiency. The patient is followed by hematology clinic. Edwin [name] receives vitamin B12 injections on a regular basis. -Per chart review, his baseline creatinine ranges from 12.1-14 over the past 7 months. On admission it was 10.4. His hemoglobin has drifted down, secondary to the dilutional effects of IV fluids. -His anemia panel during this hospitalization revealed total iron of 10, ferritin of 239, B12 of 641, and reticulocyte count of 32. -Because of the decrease in his hemoglobin, will guaiac his stools. We'll continue his PPI. -Consider outpatient GI consult given that Edwin [name] has no history of gross melena or bright red blood per rectum.  Mildly low TSH. Doubt clinical hyperthyroidism. Will order a free T4.     We'll transfer the patient to telemetry today if his blood pressure stays consistently above 100 systolically. Will order PT evaluation.          Time spent: 40 minutes.    [facility]  [facility] Pager [phone]. If 7PM-7AM, please contact night-coverage at [email or web address], password [facility] 07/17/2015, 9:44 AM  LOS: 1 day

## 2015-07-17 NOTE — Progress Notes (Signed)
ANTICOAGULATION CONSULT NOTE  Pharmacy Consult for Heparin Indication: DVT  No Known Allergies  Patient Measurements: Height:  (170.2 cm) Weight: 199 lb 8.3 oz (90.5 kg) IBW/kg (Calculated) : 66.1 HEPARIN DW (KG): 85  Vital Signs: Temp: 97.9 F (36.6 C) (09/21 1200) Temp Source: Oral (09/21 1200)  Labs:  Recent Labs  07/16/15 1255 07/17/15 0456  HGB 10.4* 9.5*  HCT 31.7* 29.3*  PLT 303 247  CREATININE 8.62* 5.67*    Estimated Creatinine Clearance: 13.8 mL/min (by C-G formula based on Cr of 5.67).  Past Medical History  Diagnosis Date  . Encephalopathy   . UTI (lower urinary tract infection)   . Renal disorder     Bladder stone,Bladder diverticulm  . Hx of sepsis 12/15  . Peripheral vascular disease 10/11/14    acute embolism and thrombosis Rt femoral vein  . Dysphagia 10/11/14  . Blood dyscrasia     pancytopenia  . Anemia     nutritional anemia  . Anemia     posthemorrhagic anemia  . Neuromuscular disorder     muscle weakness  . Failure to thrive in adult 08/22/14  . S/P IVC filter 10/05/2014    Sec to hematuria on Eliquis  . Right leg DVT 08/14/2014    R. femoral vein  . Pancytopenia 08/14/2014  . Megaloblastic anemia due to B12 deficiency 08/15/2014  . Bladder calculus 10/06/2014  . Essential hypertension 01/31/2015  . BPH (benign prostatic hyperplasia) 01/31/2015  . Sepsis 10/05/2014  . Acute encephalopathy 08/14/2014    Medications:  Scheduled:  . cefTRIAXone (ROCEPHIN)  IV  1 g Intravenous Q24H  . clonazePAM  0.5 mg Oral Daily  . cyanocobalamin  1,000 mcg Intramuscular Weekly  . docusate sodium  100 mg Oral BID  . feeding supplement (ENSURE ENLIVE)  237 mL Oral BID BM  . folic acid  1 mg Oral Daily  . gabapentin  100 mg Oral QHS  . multivitamin-prenatal  1 tablet Oral Q1200  . pantoprazole  40 mg Oral Daily  . potassium chloride  20 mEq Oral BID  . tamsulosin  0.4 mg Oral Daily    Assessment: Okay for Protocol, baseline anticoag  labs pending.  [place] = acute deep venous thrombosis. Hx DVT previously treated w/ DOAC which was stopped due to hematuria per MD note.  Also s/p IVC filter.  Acute renal failure improving.  Hx anemia noted.    Goal of Therapy:  Heparin level 0.3-0.7 units/ml Monitor platelets by anticoagulation protocol: Yes   Plan:  Give 4000 units bolus x 1 Start heparin infusion at 1300 units/hr Check anti-Xa level in 6-8 hours and daily while on heparin Continue to monitor H&H and platelets  Mady Gemma 07/17/2015,7:37 PM

## 2015-07-17 NOTE — Care Management Note (Signed)
Case Management Note  Patient Details  Name: Edwin [name] MRN: [id number] Date of Birth: 24-Aug-1951  Expected Discharge Date:  07/19/15               Expected Discharge Plan:  Home/Self Care  In-House Referral:  NA  Discharge planning Services  CM Consult  Post Acute Care Choice:  NA Choice offered to:  NA  DME Arranged:    DME Agency:     HH Arranged:    HH Agency:     Status of Service:  In process, will continue to follow  Medicare Important Message Given:    Date Medicare IM Given:    Medicare IM give by:    Date Additional Medicare IM Given:    Additional Medicare Important Message give by:     If discussed at Long Length of Stay Meetings, dates discussed:    Additional Comments: Pt is from home, lives alone and is independent at baseline. Pt uses a cane and walker as needed. Pt has no HH services or DME needs prior to admission. Pt is not on home O2. Pt has no medication needs prior to admission. Pt plans to return home with self care at DC. No CM needs anticipated, will cont to follow for DC planning.  Malcolm Metro, RN 07/17/2015, 2:39 PM

## 2015-07-17 NOTE — Plan of Care (Signed)
Per Nephro request, RN paged Dr. To inform pt has hydronephrosis and anemia. No further orders at this time.

## 2015-07-17 NOTE — Progress Notes (Signed)
Edwin [name]  MRN: [id number]  DOB/AGE: 04-19-1951 64 y.o.  Primary Care Physician:Gann, Elmarie Shiley, PA-C  Admit date: 07/16/2015  Chief Complaint:  Chief Complaint  Patient presents with  . Fall    S-Pt presented on  07/16/2015 with  Chief Complaint  Patient presents with  . Fall  .    Pt today feels better  Meds . cefTRIAXone (ROCEPHIN)  IV  1 g Intravenous Q24H  . clonazePAM  0.5 mg Oral Daily  . cyanocobalamin  1,000 mcg Intramuscular Weekly  . docusate sodium  100 mg Oral BID  . feeding supplement (ENSURE ENLIVE)  237 mL Oral BID BM  . folic acid  1 mg Oral Daily  . gabapentin  100 mg Oral QHS  . multivitamin-prenatal  1 tablet Oral Q1200  . pantoprazole  40 mg Oral Daily  . tamsulosin  0.4 mg Oral Daily     Physical Exam: Vital signs in last 24 hours: Temp:  [98.1 F (36.7 C)-99 F (37.2 C)] 99 F (37.2 C) (09/21 0844) Pulse Rate:  [72-102] 73 (09/21 0600) Resp:  [15-28] 21 (09/21 0600) BP: (72-108)/(43-60) 94/51 mmHg (09/21 0400) SpO2:  [94 %-100 %] 99 % (09/21 0600) Weight:  [180 lb (81.647 kg)-199 lb 8.3 oz (90.5 kg)] 199 lb 8.3 oz (90.5 kg) (09/21 0500) Weight change:  Last BM Date: 07/15/15  Intake/Output from previous day: 09/20 0701 - 09/21 0700 In: 2590.3 [P.O.:540; I.V.:2000.3; IV Piggyback:50] Out: 1975 [Urine:1975] Total I/O In: 240 [P.O.:240] Out: -    Physical Exam: General- pt is awake,alert, oriented to time place and person Resp- No acute REsp distress, CTA B/L NO Rhonchi CVS- S1S2 regular in rate and rhythm GIT- BS+, soft, NT, ND EXT- NO LE Edema, Cyanosis   Lab Results: CBC  Recent Labs  07/16/15 1255 07/17/15 0456  WBC 11.5* 7.6  HGB 10.4* 9.5*  HCT 31.7* 29.3*  PLT 303 247    BMET  Recent Labs  07/16/15 1255 07/17/15 0456  NA 133* 137  K 3.2* 3.3*  CL 99* 105  CO2 22 23  GLUCOSE 123* 104*  BUN 46* 41*  CREATININE 8.62* 5.67*  CALCIUM 8.4* 7.8*   Creat trend 2016 8.62=>5.67   MICRO Recent Results  (from the past 240 hour(s))  MRSA PCR Screening     Status: None   Collection Time: 07/16/15  3:45 PM  Result Value Ref Range Status   MRSA by PCR NEGATIVE NEGATIVE Final    Comment:        The GeneXpert MRSA Assay (FDA approved for NASAL specimens only), is one component of a comprehensive MRSA colonization surveillance program. It is not intended to diagnose MRSA infection nor to guide or monitor treatment for MRSA infections.       Lab Results  Component Value Date   CALCIUM 7.8* 07/17/2015   PHOS 4.0 07/17/2015        Impression: 1)Renal AKI secondary to prernal/ATN/post renal  Data in favor of Prerenal/ATN  Hypotension  ACE  HCTZ    Data in favor of post renal                   UTI  Renal u/s shows Hydronephrosis                AKI now better   2)CVS- Hypotensive, BP still on lower side                  3)Anemia Iron deficincy anemia  Primary MD following for thye need of GI          Will start PO iron,as pt has active infection cannot give IV iron  4)DVT- hx of DVT  S/p IVC filter placement   5)Urology-Pt with hx of bladders stone Primary MD following  6)Electrolytes  Hypokalemic   Stable  Hyponatremic  Hypovolemic Hyponatremia  HCTZ      Now better  7)Acid base Co2 at goal  8) ID-admitted with UTI   On IV ABX    Plan:  Will continue current care Pt on PO iron Primary team following for need of Urology and GI      Randa Lynn 07/17/2015, 9:39 AM

## 2015-07-18 ENCOUNTER — Encounter (HOSPITAL_COMMUNITY): Payer: Self-pay | Admitting: Internal Medicine

## 2015-07-18 DIAGNOSIS — D531 Other megaloblastic anemias, not elsewhere classified: Secondary | ICD-10-CM

## 2015-07-18 DIAGNOSIS — I82403 Acute embolism and thrombosis of unspecified deep veins of lower extremity, bilateral: Secondary | ICD-10-CM | POA: Diagnosis present

## 2015-07-18 LAB — HEPARIN LEVEL (UNFRACTIONATED)
HEPARIN UNFRACTIONATED: 0.41 [IU]/mL (ref 0.30–0.70)
Heparin Unfractionated: <0.1 IU/mL — ABNORMAL LOW (ref 0.30–0.70)
Heparin Unfractionated: 0.32 IU/mL (ref 0.30–0.70)

## 2015-07-18 LAB — RENAL FUNCTION PANEL
ALBUMIN: 2.8 g/dL — AB (ref 3.5–5.0)
Anion gap: 6 (ref 5–15)
BUN: 29 mg/dL — AB (ref 6–20)
CO2: 22 mmol/L (ref 22–32)
CREATININE: 2.5 mg/dL — AB (ref 0.61–1.24)
Calcium: 8.2 mg/dL — ABNORMAL LOW (ref 8.9–10.3)
Chloride: 110 mmol/L (ref 101–111)
GFR calc Af Amer: 29 mL/min — ABNORMAL LOW (ref >60)
GFR, EST NON AFRICAN AMERICAN: 25 mL/min — AB (ref >60)
Glucose, Bld: 144 mg/dL — ABNORMAL HIGH (ref 65–99)
PHOSPHORUS: 2.4 mg/dL — AB (ref 2.5–4.6)
Potassium: 3.8 mmol/L (ref 3.5–5.1)
SODIUM: 138 mmol/L (ref 135–145)

## 2015-07-18 LAB — CBC
HCT: 31.6 % — ABNORMAL LOW (ref 39.0–52.0)
Hemoglobin: 10.1 g/dL — ABNORMAL LOW (ref 13.0–17.0)
MCH: 28.6 pg (ref 26.0–34.0)
MCHC: 32 g/dL (ref 30.0–36.0)
MCV: 89.5 fL (ref 78.0–100.0)
PLATELETS: 328 10*3/uL (ref 150–400)
RBC: 3.53 MIL/uL — ABNORMAL LOW (ref 4.22–5.81)
RDW: 14.5 % (ref 11.5–15.5)
WBC: 7.2 10*3/uL (ref 4.0–10.5)

## 2015-07-18 LAB — T4, FREE: FREE T4: 0.99 ng/dL (ref 0.61–1.12)

## 2015-07-18 MED ORDER — HEPARIN BOLUS VIA INFUSION
3000.0000 [IU] | Freq: Once | INTRAVENOUS | Status: AC
  Administered 2015-07-18: 3000 [IU] via INTRAVENOUS
  Filled 2015-07-18: qty 3000

## 2015-07-18 NOTE — Progress Notes (Addendum)
ANTICOAGULATION CONSULT NOTE  Pharmacy Consult for Heparin Indication: DVT  No Known Allergies  Patient Measurements: Height:  (170.2 cm) Weight: 204 lb 2.3 oz (92.6 kg) IBW/kg (Calculated) : 66.1 HEPARIN DW (KG): 85  Vital Signs: Temp: 98.3 F (36.8 C) (09/22 0400) Temp Source: Axillary (09/22 0400) BP: 135/65 mmHg (09/22 0700) Pulse Rate: 78 (09/22 0700)  Labs:  Recent Labs  07/16/15 1255 07/17/15 0456 07/17/15 1943 07/18/15 0410  HGB 10.4* 9.5*  --  10.1*  HCT 31.7* 29.3*  --  31.6*  PLT 303 247  --  328  LABPROT  --   --  15.5*  --   INR  --   --  1.22  --   HEPARINUNFRC  --   --   --  <0.10*  CREATININE 8.62* 5.67*  --  2.50*    Estimated Creatinine Clearance: 31.5 mL/min (by C-G formula based on Cr of 2.5).  Past Medical History  Diagnosis Date  . Encephalopathy   . UTI (lower urinary tract infection)   . Renal disorder     Bladder stone,Bladder diverticulm  . Hx of sepsis 12/15  . Peripheral vascular disease 10/11/14    acute embolism and thrombosis Rt femoral vein  . Dysphagia 10/11/14  . Blood dyscrasia     pancytopenia  . Anemia     nutritional anemia  . Anemia     posthemorrhagic anemia  . Neuromuscular disorder     muscle weakness  . Failure to thrive in adult 08/22/14  . S/P IVC filter 10/05/2014    Sec to hematuria on Eliquis  . Right leg DVT 08/14/2014    R. femoral vein  . Pancytopenia 08/14/2014  . Megaloblastic anemia due to B12 deficiency 08/15/2014  . Bladder calculus 10/06/2014  . Essential hypertension 01/31/2015  . BPH (benign prostatic hyperplasia) 01/31/2015  . Sepsis 10/05/2014  . Acute encephalopathy 08/14/2014    Medications:  Scheduled:  . cefTRIAXone (ROCEPHIN)  IV  1 g Intravenous Q24H  . clonazePAM  0.5 mg Oral Daily  . cyanocobalamin  1,000 mcg Intramuscular Weekly  . docusate sodium  100 mg Oral BID  . feeding supplement (ENSURE ENLIVE)  237 mL Oral BID BM  . folic acid  1 mg Oral Daily  . gabapentin  100  mg Oral QHS  . heparin  3,000 Units Intravenous Once  . multivitamin-prenatal  1 tablet Oral Q1200  . pantoprazole  40 mg Oral Daily  . potassium chloride  20 mEq Oral BID  . tamsulosin  0.4 mg Oral Daily    Assessment: Okay for Protocol, baseline anticoag labs WNL.  [place] = acute deep venous thrombosis. Hx DVT previously treated w/ DOAC which was stopped due to hematuria per MD note.  Also s/p IVC filter.  Acute renal failure continues to improve.  Hx anemia noted, H/H stable.  Heparin Level below goal, no infusion issues noted per RN.  Goal of Therapy:  Heparin level 0.3-0.7 units/ml Monitor platelets by anticoagulation protocol: Yes   Plan:  Give 3000 units bolus x 1 Increase heparin infusion to 1600 units/hr Re-Check anti-Xa level in 6-8 hours and daily while on heparin Continue to monitor H&H and platelets  Mady Gemma 07/18/2015,8:13 AM  Addum:  Heparin level is therapeutic.  Cont drip at 1600 units.hr and recheck in ~ 6 hours to confirm. Talbert Cage, PharmD

## 2015-07-18 NOTE — Progress Notes (Signed)
Subjective: Patient complains of some nausea and epigastric discomfort. He denies any vomiting, no diarrhea. Patient also denies any difficulty in breathing.   Objective: Vital signs in last 24 hours: Temp:  [97.9 F (36.6 C)-99 F (37.2 C)] 98.3 F (36.8 C) (09/22 0400) Pulse Rate:  [78-88] 78 (09/22 0700) Resp:  [9-31] 22 (09/22 0700) BP: (107-138)/(55-92) 135/65 mmHg (09/22 0700) SpO2:  [96 %-98 %] 96 % (09/22 0700) Weight:  [204 lb 2.3 oz (92.6 kg)] 204 lb 2.3 oz (92.6 kg) (09/22 0500)  Intake/Output from previous day: 09/21 0701 - 09/22 0700 In: 3959.6 [P.O.:240; I.V.:3669.6; IV Piggyback:50] Out: 3700 [Urine:3700] Intake/Output this shift:     Recent Labs  07/16/15 1255 07/17/15 0456 07/18/15 0410  HGB 10.4* 9.5* 10.1*    Recent Labs  07/17/15 0456 07/18/15 0410  WBC 7.6 7.2  RBC 3.25* 3.53*  HCT 29.3* 31.6*  PLT 247 328    Recent Labs  07/17/15 0456 07/18/15 0410  NA 137 138  K 3.3* 3.8  CL 105 110  CO2 23 22  BUN 41* 29*  CREATININE 5.67* 2.50*  GLUCOSE 104* 144*  CALCIUM 7.8* 8.2*    Recent Labs  07/17/15 1943  INR 1.22    Generally patient is alert and in no apparent distress Chest is clear to auscultation Heart exam reveals regular rate and rhythm no murmur or S3 Abdomen: Soft positive bowel sounds  extremities no edema  Assessment/Plan: Problem #1 acute kidney injury: Possibly a combination of prerenal syndrome/ATN/ACE inhibitor. Presently his renal function is improving. Patient was 3700 mL of urine output.  Problem #2 hypokalemia: Patient is on potassium supplement presently his potassium is normal Problem #3 hyponatremia: His sodium has corrected Problem #4 anemia: Taught to be secondary to iron deficiency anemia. Problem #5 bilateral DVT: Patient on heparin Problem #6 history of mild left hydronephrosis. Problem #7 UTI: Patient is on antibiotics. She is a febrile and his white blood cell count is normal. Plan: 1] We'll  decrease IV fluids to 100 mL per hour 2] we'll check basic metabolic panel in the morning 3] we'll DC potassium by mouth supplement   BEFEKADU,BELAYENH S 07/18/2015, 8:34 AM

## 2015-07-18 NOTE — Progress Notes (Signed)
[facility] PROGRESS NOTE  Edwin [name] ZOX:[id number] DOB: March 30, 1951 DOA: 07/16/2015 PCP: Lynwood Dawley, PA-C    Code Status: Full code Family Communication: Discussed with patient's brother on 9/20 Disposition Plan: Discharge when clinically appropriate   Consultants:  Nephrology  Procedures:  None  Antibiotics:  Rocephin 9/20>>  HPI/Subjective: Patient feels a little better. He does acknowledge some pain in his legs prior to admission. He denies chest pain or pleurisy. His appetite has improved.  Objective: Filed Vitals:   07/18/15 0900  BP:   Pulse:   Temp: 98.5 F (36.9 C)  Resp:    blood pressure 134/54. Heart rate 85. Respiratory rate 20. Oxygen saturation 99%.  Intake/Output Summary (Last 24 hours) at 07/18/15 0901 Last data filed at 07/18/15 0500  Gross per 24 hour  Intake 3719.57 ml  Output   3700 ml  Net  19.57 ml   Filed Weights   07/16/15 1544 07/17/15 0500 07/18/15 0500  Weight: 88.8 kg (195 lb 12.3 oz) 90.5 kg (199 lb 8.3 oz) 92.6 kg (204 lb 2.3 oz)    Exam:   General:  Pleasant 64 year old man, alert, in no acute distress. He looks slightly better.  Cardiovascular: S1, S2, no murmurs rubs or gallops. 1+ bilateral lower extremity edema.  Respiratory: Decreased breath sounds in the bases, otherwise clear.  Abdomen: Positive bowel sounds, soft, nontender, nondistended.  Musculoskeletal/extremities: No acute hot red joints. Bilateral lower extremity edema, 1+, mildly pitting.  Neurologic: He is alert and oriented 3. Cranial nerves II through XII are grossly intact.   Data Reviewed: Basic Metabolic Panel:  Recent Labs Lab 07/16/15 1255 07/17/15 0456 07/18/15 0410  NA 133* 137 138  K 3.2* 3.3* 3.8  CL 99* 105 110  CO2 GLUCOSE 123* 104* 144*  BUN 46* 41* 29*  CREATININE 8.62* 5.67* 2.50*  CALCIUM 8.4* 7.8* 8.2*  PHOS  --  4.0 2.4*   Liver Function Tests:  Recent Labs Lab 07/16/15 1255 07/17/15 0456  07/18/15 0410  AST 34  34  --   --   ALT 25  25  --   --   ALKPHOS 67  66  --   --   BILITOT 0.8  0.8  --   --   PROT 7.7  7.6  --   --   ALBUMIN 3.4*  3.3* 2.7* 2.8*   No results for input(s): LIPASE, AMYLASE in the last 168 hours. No results for input(s): AMMONIA in the last 168 hours. CBC:  Recent Labs Lab 07/16/15 1255 07/17/15 0456 07/18/15 0410  WBC 11.5* 7.6 7.2  NEUTROABS 9.2*  --   --   HGB 10.4* 9.5* 10.1*  HCT 31.7* 29.3* 31.6*  MCV 88.3 90.2 89.5  PLT 303 247 328   Cardiac Enzymes: No results for input(s): CKTOTAL, CKMB, CKMBINDEX, TROPONINI in the last 168 hours. BNP (last 3 results) No results for input(s): BNP in the last 8760 hours.  ProBNP (last 3 results)  Recent Labs  08/13/14 2224  PROBNP 82.8    CBG: No results for input(s): GLUCAP in the last 168 hours.  Recent Results (from the past 240 hour(s))  Blood culture (routine x 2)     Status: None (Preliminary result)   Collection Time: 07/16/15  2:10 PM  Result Value Ref Range Status   Specimen Description BLOOD  Final   Special Requests BLOOD  Final   Culture NO GROWTH < 24 HOURS  Final   Report Status PENDING  Incomplete  Blood culture (routine x 2)     Status: None (Preliminary result)   Collection Time: 07/16/15  2:17 PM  Result Value Ref Range Status   Specimen Description BLOOD  Final   Special Requests NormaBLD  Final   Culture NO GROWTH < 24 HOURS  Final   Report Status PENDING  Incomplete  MRSA PCR Screening     Status: None   Collection Time: 07/16/15  3:45 PM  Result Value Ref Range Status   MRSA by PCR NEGATIVE NEGATIVE Final    Comment:        The GeneXpert MRSA Assay (FDA approved for NASAL specimens only), is one component of a comprehensive MRSA colonization surveillance program. It is not intended to diagnose MRSA infection nor to guide or monitor treatment for MRSA infections.      Studies: US Renal  07/16/2015   CLINICAL DATA:  64 year old male with  acute kidney injury  EXAM: RENAL / URINARY TRACT ULTRASOUND COMPLETE  COMPARISON:  None.  FINDINGS: Right Kidney:  Length: 11.6 cm. Normal echogenicity of the parenchyma. No evidence of hydronephrosis. Anechoic cyst in the upper pole measures 16 x 20 x 17 mm. There is a solitary thin internal septation. Findings are most consistent with a minimally complex cyst.  Left Kidney:  Length: 10.8 cm. Parenchymal echogenicity is within normal limits. There is a mild hydronephrosis. No solid mass.  Bladder:  Decompressed with Foley catheter in place.  IMPRESSION: 1. Mild left hydronephrosis. 2. No evidence of right-sided hydronephrosis. 3. Minimally complex right renal cyst.   Electronically Signed   By: Malachy Moan M.D.   On: 07/16/2015 18:18   US Venous Img Lower Bilateral  07/17/2015   ADDENDUM REPORT: 07/17/2015 19:20 ADDENDUM: Critical Value/emergent results were called by telephone at the time of interpretation on 07/17/2015 at 7:20 pm to Dr. Sharl Ma, who verbally acknowledged these results. Electronically Signed   By: Lupita Raider, M.D.   On: 07/17/2015 19:20  07/17/2015   CLINICAL DATA:  Bilateral lower extremity edema.  EXAM: BILATERAL LOWER EXTREMITY VENOUS DOPPLER ULTRASOUND  TECHNIQUE: Gray-scale sonography with graded compression, as well as color Doppler and duplex ultrasound were performed to evaluate the lower extremity deep venous systems from the level of the common femoral vein and including the common femoral, femoral, profunda femoral, popliteal and calf veins including the posterior tibial, peroneal and gastrocnemius veins when visible. The superficial great saphenous vein was also interrogated. Spectral Doppler was utilized to evaluate flow at rest and with distal augmentation maneuvers in the common femoral, femoral and popliteal veins.  COMPARISON:  None.  FINDINGS: RIGHT LOWER EXTREMITY  Common Femoral Vein: No evidence of thrombus. Normal compressibility, respiratory phasicity and response  to augmentation.  Saphenofemoral Junction: No evidence of thrombus. Normal compressibility and flow on color Doppler imaging.  Profunda Femoral Vein: No evidence of thrombus. Normal compressibility and flow on color Doppler imaging.  Femoral Vein: Noncompressible with occlusive thrombus present.  Popliteal Vein: Noncompressible with occlusive thrombus present.  Calf Veins: Not well visualized.  Superficial Great Saphenous Vein: No evidence of thrombus. Normal compressibility and flow on color Doppler imaging.  Venous Reflux:  None.  Other Findings:  None.  LEFT LOWER EXTREMITY  Common Femoral Vein: Noncompressible due to nonocclusive thrombus.  Saphenofemoral Junction: No evidence of thrombus. Normal compressibility and flow on color Doppler imaging.  Profunda Femoral Vein: No evidence of thrombus. Normal compressibility and flow on color Doppler imaging.  Femoral Vein: Noncompressible secondary to  occlusive thrombus.  Popliteal Vein: Noncompressible secondary to occlusive thrombus.  Calf Veins: Not well visualized.  Superficial Great Saphenous Vein: No evidence of thrombus. Normal compressibility and flow on color Doppler imaging.  Venous Reflux:  None.  Other Findings:  None.  IMPRESSION: Findings consistent with acute deep venous thrombosis involving the superficial femoral and popliteal veins bilaterally. These results will be called to the ordering clinician or representative by the Radiologist Assistant, and communication documented in the PACS or zVision Dashboard.  Electronically Signed: By: Lupita Raider, M.D. On: 07/17/2015 19:16   Dg Chest Portable 1 View  07/16/2015   CLINICAL DATA:  Fall several days ago.  EXAM: PORTABLE CHEST - 1 VIEW  COMPARISON:  10/05/2014  FINDINGS: The heart size appears enlarged. There is no pleural or pericardial effusion identified. Both lungs are clear. The visualized skeletal structures are unremarkable.  IMPRESSION: No active disease.   Electronically Signed   By:  Signa Kell M.D.   On: 07/16/2015 14:02    Scheduled Meds: . cefTRIAXone (ROCEPHIN)  IV  1 g Intravenous Q24H  . clonazePAM  0.5 mg Oral Daily  . cyanocobalamin  1,000 mcg Intramuscular Weekly  . docusate sodium  100 mg Oral BID  . feeding supplement (ENSURE ENLIVE)  237 mL Oral BID BM  . folic acid  1 mg Oral Daily  . gabapentin  100 mg Oral QHS  . multivitamin-prenatal  1 tablet Oral Q1200  . pantoprazole  40 mg Oral Daily  . tamsulosin  0.4 mg Oral Daily   Continuous Infusions: . 0.9 % NaCl with KCl 20 mEq / L 150 mL/hr at 07/17/15 2247  . heparin 1,600 Units/hr (07/18/15 0831)   Assessment and plan:  Principal Problem:   Generalized weakness Active Problems:   Acute kidney injury   UTI (urinary tract infection)   Volume depletion   Hypotension   DVT, bilateral lower limbs   Megaloblastic anemia due to B12 deficiency   Hypokalemia   Bilateral lower extremity edema   Normocytic anemia   1. Acute kidney injury.  In the ED, the patient's BUN was 46 and his creatinine was 8.62. On 06/17/15 his creatinine was within normal limits at 1.17.  The patient was started on vigorous IV fluids. HCTZ and lisinopril were withheld. A renal ultrasound was ordered and revealed  mild left hydronephrosis and a minimally complex right renal cyst; normal echogenicity. - Nephrology was consulted and ordered additional labs. -With IV fluid hydration and holding HCTZ/lisinopril, his renal function has improved, consistent with prerenal azotemia, in the setting of diuretic and ACE inhibitor. Patient denied any history of diarrhea or vomiting, but his oral intake has been marginal over the past several days. -Foley catheter was placed following admission. He has good urine output.  -We'll continue IV fluids, but will defer the rate to nephrology who is adjusting now. -Etiology of acute renal failure/AKI is primarily prerenal azotemia in the setting of diuretic therapy, ACE inhibitor therapy, and  poor oral intake. A contribution from ATN and possible bladder outlet obstruction given mild left hydronephrosis are also considerations. -Patient's renal function-BUN/creatinine have improved progressively.  Pyuria and bacteriuria, presumed UTI. - Patient has a history of UTI and has been treated with Cipro and suppressive therapy with nitrofurantoin, but the patient was unclear if he was still taking it. Given the patient's symptomatology, he was started on treatment for UTI with Rocephin. Urine culture is pending. Blood cultures are pending.  Bilateral lower extremity edema.  The patient reported intermittent swelling in his legs, a little worse than usual.  The patient has a history of right lower extremity DVT-status post IVC filter. Eliquis was apparently discontinued when he developed hematuria.  Patient was started on DVT prophylaxis will be ordered with SCDs. -Bilateral lower extremity venous ultrasound revealed bilateral DVT. Patient was started on a heparin drip overnight. -We'll continue IV heparin, but will consult hematology for recommendations regarding treatment/management given his history.  Hypotension.  The patient's blood pressure was in the 70s systolically in the ED. With IV fluid hydration, his blood pressure has improved. -I believe the etiology of his hypotension was from hypovolemia in the setting of a diuretic therapy and poor oral intake. I doubt sepsis. We'll continue IV fluid hydration, adjustment and rate per nephrology.  Hypokalemia.  Patient's serum potassium was 3.2 on admission, likely secondary to HCTZ. We'll continue to supplement orally cautiously in the setting of acute kidney injury. We'll continue potassium in the IV fluids.  Mild hyponatremia. The patient's serum sodium was 133 on admission, likely secondary to hypovolemia. -His serum sodium has improved on normal saline.    Pernicious anemia/ vitamin B12 deficiency.  The patient has a long-standing  history of anemia, secondary to vitamin B12 deficiency. The patient is followed by hematology clinic. He receives vitamin B12 injections on a regular basis. -Per chart review, his baseline creatinine ranges from 12.1-14 over the past 7 months. On admission it was 10.4. His hemoglobin has drifted down, secondary to the dilutional effects of IV fluids. -His anemia panel during this hospitalization revealed total iron of 10, ferritin of 239, B12 of 641, and reticulocyte count of 32. -Because of the decrease in his hemoglobin, will guaiac his stools. We'll continue his PPI. -Consider outpatient GI consult given that he has no history of gross melena or bright red blood per rectum.  Mildly low TSH. Doubt clinical hyperthyroidism. Free T4 ordered and is pending.     We'll transfer the patient to telemetry today. Will order PT evaluation.          Time spent: 35 minutes.    [facility]  [facility] Pager [phone]. If 7PM-7AM, please contact night-coverage at [email or web address], password [facility] 07/18/2015, 9:01 AM  LOS: 2 days

## 2015-07-18 NOTE — Progress Notes (Signed)
Report called to Arliss Journey, RN. Patient transferred to [id number] in stable condition via wheelchair with NT.

## 2015-07-18 NOTE — Consult Note (Signed)
[facility] Consultation Oncology  Name: Edwin [name]      MRN: [id number]    Location: A302/A302-01  Date: 07/18/2015 Time:4:03 PM   REFERRING PHYSICIAN:  Rexene Alberts, MD  REASON FOR CONSULT:  Bilateral DVT with prior hematuria and IVC filter   DIAGNOSIS:  New DVT involving superficial femoral and popliteal veins bilaterally.  HISTORY OF PRESENT ILLNESS:   [name] is a 65 year old man who is well known to the [facility] where he was followed for a history of VTE with a negative hypercoag panel, intolerance to direct Xa inhibitor (Eliquis) due to hematuria, resulting in IVC filter placement who is found to have a new DVT.  I personally reviewed and went over laboratory results with the patient.  The results are noted within this dictation.  I personally reviewed and went over radiographic studies with the patient.  The results are noted within this dictation.    Chart reviewed.  In summary: In October 2015, the patient was found to have a DVT.  Hypercoag panel was negative for any primary cause.  The day prior to hematology inpatient consult, his Hgb was noted to be 7.7 g/dL and he subsequently received PRBCs.  During this hospital stay, he was found to have severe B12 deficiency.  On 10/21, hematology consult was completed for new DVT, and it was recommended that a Direct Xa inhibitor be used as anticoagulation of choice, given his mental capability and history of renal disease.  He was started on Eliquis, and within 24 hours, it was reported that the patient developed gross hematuria and Eliquis was discontinued.  This resulted in an IVC filter being placed (without hematology input) and discontinuation of anticoagulation.  Urology was consulted and bladder stones were noted on cystoscopy.  As a result of this history, it is difficult to determine the amount of blood loss while on anticoagulation since he was already receiving PRBCs for severe B12 deficiency.  Today, the  patient reports that he is breathing ok.  He notes that he is at his baseline.  He denies any shortness of breath and chest pain.  He denies any pain in his legs.   He still lives alone in an apartment.  He notes that his son checks on him regularly.   PAST MEDICAL HISTORY:   Past Medical History  Diagnosis Date  . Encephalopathy   . UTI (lower urinary tract infection)   . Renal disorder     Bladder stone,Bladder diverticulm  . Hx of sepsis 12/15  . Peripheral vascular disease 10/11/14    acute embolism and thrombosis Rt femoral vein  . Dysphagia 10/11/14  . Blood dyscrasia     pancytopenia  . Anemia     nutritional anemia  . Anemia     posthemorrhagic anemia  . Neuromuscular disorder     muscle weakness  . Failure to thrive in adult 08/22/14  . S/P IVC filter 10/05/2014    Sec to hematuria on Eliquis  . Right leg DVT 08/14/2014    R. femoral vein  . Pancytopenia 08/14/2014  . Megaloblastic anemia due to B12 deficiency 08/15/2014  . Bladder calculus 10/06/2014  . Essential hypertension 01/31/2015  . BPH (benign prostatic hyperplasia) 01/31/2015  . Sepsis 10/05/2014  . Acute encephalopathy 08/14/2014    ALLERGIES: No Known Allergies    MEDICATIONS: I have reviewed the patient's current medications.     PAST SURGICAL HISTORY Past Surgical History  Procedure Laterality Date  . Cystoscopy N/A  08/21/2014    Procedure: CYSTOSCOPY FLEXIBLE;  Surgeon: Jorja Loa, MD;  Location: [facility];  Service: Urology;  Laterality: N/A;  at bedside  . Cystoscopy with litholapaxy N/A 11/22/2014    Procedure: CYSTOSCOPY WITH LITHOLAPAXY;  Surgeon: Jorja Loa, MD;  Location: [facility];  Service: Urology;  Laterality: N/A;  . Transurethral incision of prostate N/A 11/22/2014    Procedure: TRANSURETHRAL INCISION OF THE PROSTATE (TUIP);  Surgeon: Jorja Loa, MD;  Location: [facility];  Service: Urology;  Laterality: N/A;    FAMILY HISTORY: Family History  Problem Relation Age  of Onset  . Cancer Sister     SOCIAL HISTORY:  reports that he has been smoking Cigars.  He has never used smokeless tobacco. He reports that he does not drink alcohol or use illicit drugs.  PERFORMANCE STATUS: The patient's performance status is 1 - Symptomatic but completely ambulatory  PHYSICAL EXAM: Most Recent Vital Signs: Blood pressure 139/63, pulse 78, temperature 98.3 F (36.8 C), temperature source Oral, resp. rate 20, height 5' 7"  (1.702 m), weight 204 lb 2.3 oz (92.6 kg), SpO2 99 %. General appearance: alert, cooperative, appears older than stated age and no distress Head: Normocephalic, without obvious abnormality, atraumatic Eyes: negative findings: lids and lashes normal, conjunctivae and sclerae normal and corneas clear Neck: no adenopathy Lungs: clear to auscultation bilaterally Heart: regular rate and rhythm Extremities: left leg is warm to the touch and larger in size compared to the right. Skin: Skin color, texture, turgor normal. No rashes or lesions Neurologic: Grossly normal  LABORATORY DATA:  Results for orders placed or performed during the hospital encounter of 07/16/15 (from the past 48 hour(s))  Sodium, urine, random     Status: None   Collection Time: 07/16/15  4:14 PM  Result Value Ref Range   Sodium, Ur 55 mmol/L  Creatinine, urine, random     Status: None   Collection Time: 07/16/15  4:14 PM  Result Value Ref Range   Creatinine, Urine 252.50 mg/dL  Folate     Status: None   Collection Time: 07/16/15  5:19 PM  Result Value Ref Range   Folate >80.0 >5.9 ng/mL    Comment: RESULTS CONFIRMED BY MANUAL DILUTION Performed at [facility]   TSH     Status: Abnormal   Collection Time: 07/17/15  4:56 AM  Result Value Ref Range   TSH 0.347 (L) 0.350 - 4.500 uIU/mL  Renal function panel     Status: Abnormal   Collection Time: 07/17/15  4:56 AM  Result Value Ref Range   Sodium 137 135 - 145 mmol/L   Potassium 3.3 (L) 3.5 - 5.1 mmol/L    Chloride 105 101 - 111 mmol/L   CO2 23 22 - 32 mmol/L   Glucose, Bld 104 (H) 65 - 99 mg/dL   BUN 41 (H) 6 - 20 mg/dL   Creatinine, Ser 5.67 (H) 0.61 - 1.24 mg/dL    Comment: DELTA CHECK NOTED   Calcium 7.8 (L) 8.9 - 10.3 mg/dL   Phosphorus 4.0 2.5 - 4.6 mg/dL   Albumin 2.7 (L) 3.5 - 5.0 g/dL   GFR calc non Af Amer 9 (L) >60 mL/min   GFR calc Af Amer 11 (L) >60 mL/min    Comment: (NOTE) The eGFR has been calculated using the CKD EPI equation. This calculation has not been validated in all clinical situations. eGFR's persistently <60 mL/min signify possible Chronic Kidney Disease.    Anion gap 9 5 -  15  CBC     Status: Abnormal   Collection Time: 07/17/15  4:56 AM  Result Value Ref Range   WBC 7.6 4.0 - 10.5 K/uL   RBC 3.25 (L) 4.22 - 5.81 MIL/uL   Hemoglobin 9.5 (L) 13.0 - 17.0 g/dL   HCT 29.3 (L) 39.0 - 52.0 %   MCV 90.2 78.0 - 100.0 fL   MCH 29.2 26.0 - 34.0 pg   MCHC 32.4 30.0 - 36.0 g/dL   RDW 14.4 11.5 - 15.5 %   Platelets 247 150 - 400 K/uL  Protime-INR     Status: Abnormal   Collection Time: 07/17/15  7:43 PM  Result Value Ref Range   Prothrombin Time 15.5 (H) 11.6 - 15.2 seconds   INR 1.22 0.00 - 1.49  Renal function panel     Status: Abnormal   Collection Time: 07/18/15  4:10 AM  Result Value Ref Range   Sodium 138 135 - 145 mmol/L   Potassium 3.8 3.5 - 5.1 mmol/L   Chloride 110 101 - 111 mmol/L   CO2 22 22 - 32 mmol/L   Glucose, Bld 144 (H) 65 - 99 mg/dL   BUN 29 (H) 6 - 20 mg/dL   Creatinine, Ser 2.50 (H) 0.61 - 1.24 mg/dL    Comment: DELTA CHECK NOTED   Calcium 8.2 (L) 8.9 - 10.3 mg/dL   Phosphorus 2.4 (L) 2.5 - 4.6 mg/dL   Albumin 2.8 (L) 3.5 - 5.0 g/dL   GFR calc non Af Amer 25 (L) >60 mL/min   GFR calc Af Amer 29 (L) >60 mL/min    Comment: (NOTE) The eGFR has been calculated using the CKD EPI equation. This calculation has not been validated in all clinical situations. eGFR's persistently <60 mL/min signify possible Chronic Kidney Disease.     Anion gap 6 5 - 15  Heparin level (unfractionated)     Status: Abnormal   Collection Time: 07/18/15  4:10 AM  Result Value Ref Range   Heparin Unfractionated <0.10 (L) 0.30 - 0.70 IU/mL    Comment:        IF HEPARIN RESULTS ARE BELOW EXPECTED VALUES, AND PATIENT DOSAGE HAS BEEN CONFIRMED, SUGGEST FOLLOW UP TESTING OF ANTITHROMBIN III LEVELS.   CBC     Status: Abnormal   Collection Time: 07/18/15  4:10 AM  Result Value Ref Range   WBC 7.2 4.0 - 10.5 K/uL   RBC 3.53 (L) 4.22 - 5.81 MIL/uL   Hemoglobin 10.1 (L) 13.0 - 17.0 g/dL   HCT 31.6 (L) 39.0 - 52.0 %   MCV 89.5 78.0 - 100.0 fL   MCH 28.6 26.0 - 34.0 pg   MCHC 32.0 30.0 - 36.0 g/dL   RDW 14.5 11.5 - 15.5 %   Platelets 328 150 - 400 K/uL  Heparin level (unfractionated)     Status: None   Collection Time: 07/18/15  2:34 PM  Result Value Ref Range   Heparin Unfractionated 0.41 0.30 - 0.70 IU/mL    Comment:        IF HEPARIN RESULTS ARE BELOW EXPECTED VALUES, AND PATIENT DOSAGE HAS BEEN CONFIRMED, SUGGEST FOLLOW UP TESTING OF ANTITHROMBIN III LEVELS.       RADIOGRAPHY: US Renal  07/16/2015   CLINICAL DATA:  64 year old male with acute kidney injury  EXAM: RENAL / URINARY TRACT ULTRASOUND COMPLETE  COMPARISON:  None.  FINDINGS: Right Kidney:  Length: 11.6 cm. Normal echogenicity of the parenchyma. No evidence of hydronephrosis. Anechoic cyst in the upper  pole measures 16 x 20 x 17 mm. There is a solitary thin internal septation. Findings are most consistent with a minimally complex cyst.  Left Kidney:  Length: 10.8 cm. Parenchymal echogenicity is within normal limits. There is a mild hydronephrosis. No solid mass.  Bladder:  Decompressed with Foley catheter in place.  IMPRESSION: 1. Mild left hydronephrosis. 2. No evidence of right-sided hydronephrosis. 3. Minimally complex right renal cyst.   Electronically Signed   By: Jacqulynn Cadet M.D.   On: 07/16/2015 18:18   US Venous Img Lower Bilateral  07/17/2015   ADDENDUM REPORT:  07/17/2015 19:20 ADDENDUM: Critical Value/emergent results were called by telephone at the time of interpretation on 07/17/2015 at 7:20 pm to Dr. Darrick Meigs, who verbally acknowledged these results. Electronically Signed   By: Marijo Conception, M.D.   On: 07/17/2015 19:20  07/17/2015   CLINICAL DATA:  Bilateral lower extremity edema.  EXAM: BILATERAL LOWER EXTREMITY VENOUS DOPPLER ULTRASOUND  TECHNIQUE: Gray-scale sonography with graded compression, as well as color Doppler and duplex ultrasound were performed to evaluate the lower extremity deep venous systems from the level of the common femoral vein and including the common femoral, femoral, profunda femoral, popliteal and calf veins including the posterior tibial, peroneal and gastrocnemius veins when visible. The superficial great saphenous vein was also interrogated. Spectral Doppler was utilized to evaluate flow at rest and with distal augmentation maneuvers in the common femoral, femoral and popliteal veins.  COMPARISON:  None.  FINDINGS: RIGHT LOWER EXTREMITY  Common Femoral Vein: No evidence of thrombus. Normal compressibility, respiratory phasicity and response to augmentation.  Saphenofemoral Junction: No evidence of thrombus. Normal compressibility and flow on color Doppler imaging.  Profunda Femoral Vein: No evidence of thrombus. Normal compressibility and flow on color Doppler imaging.  Femoral Vein: Noncompressible with occlusive thrombus present.  Popliteal Vein: Noncompressible with occlusive thrombus present.  Calf Veins: Not well visualized.  Superficial Great Saphenous Vein: No evidence of thrombus. Normal compressibility and flow on color Doppler imaging.  Venous Reflux:  None.  Other Findings:  None.  LEFT LOWER EXTREMITY  Common Femoral Vein: Noncompressible due to nonocclusive thrombus.  Saphenofemoral Junction: No evidence of thrombus. Normal compressibility and flow on color Doppler imaging.  Profunda Femoral Vein: No evidence of thrombus. Normal  compressibility and flow on color Doppler imaging.  Femoral Vein: Noncompressible secondary to occlusive thrombus.  Popliteal Vein: Noncompressible secondary to occlusive thrombus.  Calf Veins: Not well visualized.  Superficial Great Saphenous Vein: No evidence of thrombus. Normal compressibility and flow on color Doppler imaging.  Venous Reflux:  None.  Other Findings:  None.  IMPRESSION: Findings consistent with acute deep venous thrombosis involving the superficial femoral and popliteal veins bilaterally. These results will be called to the ordering clinician or representative by the Radiologist Assistant, and communication documented in the PACS or zVision Dashboard.  Electronically Signed: By: Marijo Conception, M.D. On: 07/17/2015 19:16       PATHOLOGY:  None  ASSESSMENT/PLAN:   Difficult situation.  As mentioned above, patient's first DVT was in October 2015.  Hypercoag panel was negative at that time.  He was found to be severely B12 deficient requiring blood transfusions that preceded the start of anticoagulation.  He was transitioned from Heparin to Eliquis on 08/15/2014 and within 24 hours, it was reported that the patient experienced gross hematuria.  As a result, Eliquis was discontinued.  Hgb continued to trend downward.  As a result, all anticoagulation was discontinued and an IVC  filter was placed without hematology input.  It is difficult to determine if the patient's anemia was secondary to GU bleeding from anticoagulation or from severe B12 deficiency.  Hematology was consulted due to recurrent DVT in B/L LE.  IVC filters themselves are thrombophilic.  He has not been on anticoagulation (except during this hospitalization).  B12 deficiency is corrected with SQ B12 injections monthly, although compliance is questionable.  Recent B12 level was WNL.    Of note, 4 weeks prior to ED visit, Hgb was WNL.  It is noted within the medical record recently, that the patient has iron deficiency, but I  would argue that with a normal ferritin and a low-normal TIBC, the patient has an anemia of chronic/renal disease picture.  In ED, patient noted to be in ARF.  On chart review, significance of past GU bleeding while on anticoagulation is indeterminable.  He was evaluate by urology and bladder stones were noted in October 2015.  This could explain his GU bleeding (particularly in the setting of therapeutic anticoagulation).  It is hard to determine the impact previous anticoagulation had on his GU bleeding in the setting of bladder stones and B12 deficiency.  We will try to ascertain outpatient urology notes to determine outpatient findings and evaluations in the past.  Long-term anticoagulation is recommended given recurrent DVT in the setting of a thrombophilic IVC filter.  Based upon urologic findings in 2015 and 2016, we will make further anticoagulation recommendations.  He is probably not a good candidate for Vitamin K Antagonist therapy.  For now, continue Heparin.  I do not see any role for iron replacement therapy at this time based upon lab results.  I would recommend evaluation for SNF placement following discharge.  Will order a chest xray and V/Q scan (due to renal function) to evaluate for PE.  Further recommendations to follow.  All questions were answered. The patient knows to call the clinic with any problems, questions or concerns. We can certainly see the patient much sooner if necessary.  Patient and plan discussed with Dr. Ancil Linsey and she is in agreement with the aforementioned.   KEFALAS,THOMAS  07/18/2015 4:54 PM   AS above, At patients last visit CBC was WNL.  He continues on B12 although he has missed a few injections. I would like to review urology notes in regards to his prior hematuria and management of bladder stones. Unfortunately filters have obvious complications and do nothing to protect against the risk of recurrent thrombosis. I anticipate that he will need  ongoing anticoagulation but will make additional recommendations once prior urologic records are reviewed. Continue with heparin, thus far he seems to be doing well. Will follow-up in am. Donald Pore MD

## 2015-07-19 ENCOUNTER — Inpatient Hospital Stay (HOSPITAL_COMMUNITY): Payer: Medicare Other

## 2015-07-19 ENCOUNTER — Other Ambulatory Visit (HOSPITAL_COMMUNITY): Payer: Self-pay | Admitting: Oncology

## 2015-07-19 ENCOUNTER — Encounter (HOSPITAL_COMMUNITY): Payer: Self-pay

## 2015-07-19 DIAGNOSIS — R531 Weakness: Secondary | ICD-10-CM

## 2015-07-19 DIAGNOSIS — R319 Hematuria, unspecified: Secondary | ICD-10-CM

## 2015-07-19 DIAGNOSIS — D510 Vitamin B12 deficiency anemia due to intrinsic factor deficiency: Secondary | ICD-10-CM

## 2015-07-19 LAB — HEPARIN LEVEL (UNFRACTIONATED): Heparin Unfractionated: 0.24 IU/mL — ABNORMAL LOW (ref 0.30–0.70)

## 2015-07-19 LAB — RENAL FUNCTION PANEL
Albumin: 2.8 g/dL — ABNORMAL LOW (ref 3.5–5.0)
Anion gap: 7 (ref 5–15)
BUN: 19 mg/dL (ref 6–20)
CHLORIDE: 111 mmol/L (ref 101–111)
CO2: 22 mmol/L (ref 22–32)
CREATININE: 1.47 mg/dL — AB (ref 0.61–1.24)
Calcium: 8.6 mg/dL — ABNORMAL LOW (ref 8.9–10.3)
GFR calc Af Amer: 56 mL/min — ABNORMAL LOW (ref >60)
GFR calc non Af Amer: 48 mL/min — ABNORMAL LOW (ref >60)
GLUCOSE: 110 mg/dL — AB (ref 65–99)
POTASSIUM: 4.3 mmol/L (ref 3.5–5.1)
Phosphorus: 3 mg/dL (ref 2.5–4.6)
Sodium: 140 mmol/L (ref 135–145)

## 2015-07-19 LAB — URINE CULTURE

## 2015-07-19 LAB — CBC
HEMATOCRIT: 31.2 % — AB (ref 39.0–52.0)
Hemoglobin: 9.9 g/dL — ABNORMAL LOW (ref 13.0–17.0)
MCH: 28.5 pg (ref 26.0–34.0)
MCHC: 31.7 g/dL (ref 30.0–36.0)
MCV: 89.9 fL (ref 78.0–100.0)
Platelets: 390 10*3/uL (ref 150–400)
RBC: 3.47 MIL/uL — ABNORMAL LOW (ref 4.22–5.81)
RDW: 14.7 % (ref 11.5–15.5)
WBC: 7 10*3/uL (ref 4.0–10.5)

## 2015-07-19 MED ORDER — TECHNETIUM TC 99M DIETHYLENETRIAME-PENTAACETIC ACID
40.0000 | Freq: Once | INTRAVENOUS | Status: DC | PRN
  Administered 2015-07-19: 40 via INTRAVENOUS
  Filled 2015-07-19: qty 40

## 2015-07-19 MED ORDER — BISACODYL 5 MG PO TBEC
10.0000 mg | DELAYED_RELEASE_TABLET | Freq: Once | ORAL | Status: AC
  Administered 2015-07-19: 10 mg via ORAL
  Filled 2015-07-19: qty 2

## 2015-07-19 MED ORDER — APIXABAN 5 MG PO TABS: 5.0000 mg | ORAL_TABLET | Freq: Two times a day (BID) | ORAL | Status: DC

## 2015-07-19 MED ORDER — MAGNESIUM HYDROXIDE 400 MG/5ML PO SUSP: 30.0000 mL | Freq: Every day | ORAL | Status: DC | PRN

## 2015-07-19 MED ORDER — TECHNETIUM TO 99M ALBUMIN AGGREGATED
6.0000 | Freq: Once | INTRAVENOUS | Status: AC | PRN
  Administered 2015-07-19: 6 via INTRAVENOUS

## 2015-07-19 MED ORDER — APIXABAN 5 MG PO TABS
10.0000 mg | ORAL_TABLET | Freq: Two times a day (BID) | ORAL | Status: DC
  Administered 2015-07-19 – 2015-07-20 (×3): 10 mg via ORAL
  Filled 2015-07-19 (×3): qty 2

## 2015-07-19 NOTE — Care Management Important Message (Signed)
Important Message  Patient Details  Name: Edwin [name] MRN: [id number] Date of Birth: 06-27-1951   Medicare Important Message Given:  Yes-second notification given    Malcolm Metro, RN 07/19/2015, 9:17 AM

## 2015-07-19 NOTE — Progress Notes (Signed)
ANTICOAGULATION CONSULT NOTE  Pharmacy Consult for Apixaban Indication: DVT  No Known Allergies  Patient Measurements: Height:  (170.2 cm) Weight: 204 lb 9.4 oz (92.8 kg) IBW/kg (Calculated) : 66.1 HEPARIN DW (KG): 85  Vital Signs: Temp: 98.7 F (37.1 C) (09/23 0603) Temp Source: Oral (09/23 0603) BP: 144/69 mmHg (09/23 0603) Pulse Rate: 81 (09/23 0603)  Labs:  Recent Labs  07/17/15 0456 07/17/15 1943  07/18/15 0410 07/18/15 1434 07/18/15 2207 07/19/15 0522  HGB 9.5*  --   --  10.1*  --   --  9.9*  HCT 29.3*  --   --  31.6*  --   --  31.2*  PLT 247  --   --  328  --   --  390  LABPROT  --  15.5*  --   --   --   --   --   INR  --  1.22  --   --   --   --   --   HEPARINUNFRC  --   --   < > <0.10* 0.41 0.32 0.24*  CREATININE 5.67*  --   --  2.50*  --   --  1.47*  < > = values in this interval not displayed.  Estimated Creatinine Clearance: 53.7 mL/min (by C-G formula based on Cr of 1.47).  Past Medical History  Diagnosis Date  . Encephalopathy   . UTI (lower urinary tract infection)   . Renal disorder     Bladder stone,Bladder diverticulm  . Hx of sepsis 12/15  . Peripheral vascular disease 10/11/14    acute embolism and thrombosis Rt femoral vein  . Dysphagia 10/11/14  . Blood dyscrasia     pancytopenia  . Anemia     nutritional anemia  . Anemia     posthemorrhagic anemia  . Neuromuscular disorder     muscle weakness  . Failure to thrive in adult 08/22/14  . S/P IVC filter 10/05/2014    Sec to hematuria on Eliquis  . Right leg DVT 08/14/2014    R. femoral vein  . Pancytopenia 08/14/2014  . Megaloblastic anemia due to B12 deficiency 08/15/2014  . Bladder calculus 10/06/2014  . Essential hypertension 01/31/2015  . BPH (benign prostatic hyperplasia) 01/31/2015  . Sepsis 10/05/2014  . Acute encephalopathy 08/14/2014    Medications:  Scheduled:  . apixaban  10 mg Oral BID   Followed by  . [START ON 07/26/2015] apixaban  5 mg Oral BID  .  cefTRIAXone (ROCEPHIN)  IV  1 g Intravenous Q24H  . clonazePAM  0.5 mg Oral Daily  . cyanocobalamin  1,000 mcg Intramuscular Weekly  . docusate sodium  100 mg Oral BID  . feeding supplement (ENSURE ENLIVE)  237 mL Oral BID BM  . folic acid  1 mg Oral Daily  . gabapentin  100 mg Oral QHS  . multivitamin-prenatal  1 tablet Oral Q1200  . pantoprazole  40 mg Oral Daily  . tamsulosin  0.4 mg Oral Daily   Assessment: Okay for Protocol, baseline anticoag labs WNL.  [place] = acute deep venous thrombosis. Hx DVT previously treated w/ DOAC which was stopped due to hematuria per MD note.  Also s/p IVC filter.  Acute renal failure continues to improve.  Hx anemia noted, H/H stable. CBC appears stable. Pt has been on IV heparin, now asked to transition to Apixaban for VTE treatment.    Goal of Therapy:  Full anticoagulation, VTE treatment w/ Apixaban Monitor platelets by anticoagulation  protocol: Yes   Plan:  Apixaban  po BID x 7 days then Apixaban  po BID thereafter Continue to monitor H&H and platelets Provide education  Valrie Hart A 07/19/2015,11:11 AM

## 2015-07-19 NOTE — Care Management Note (Signed)
Case Management Note  Patient Details  Name: Belford Pascucci MRN: [id number] Date of Birth: 08-22-1951  Expected Discharge Date:  07/19/15               Expected Discharge Plan:  Skilled Nursing Facility  In-House Referral:  Clinical Social Work  Discharge planning Services  CM Consult  Post Acute Care Choice:  NA Choice offered to:  NA  DME Arranged:    DME Agency:     HH Arranged:    HH Agency:     Status of Service:  In process, will continue to follow  Medicare Important Message Given:  Yes-second notification given Date Medicare IM Given:    Medicare IM give by:    Date Additional Medicare IM Given:    Additional Medicare Important Message give by:     If discussed at Long Length of Stay Meetings, dates discussed:    Additional Comments: Hematology has made [facility] recommendation, PT eval pending. Will cont to follow for DC planning.  Malcolm Metro, RN 07/19/2015, 2:07 PM

## 2015-07-19 NOTE — Care Management Note (Signed)
Case Management Note  Patient Details  Name: Edwin [name] MRN: [id number] Date of Birth: [birth year]  Expected Discharge Date:  07/19/15               Expected Discharge Plan:  Skilled Nursing Facility  In-House Referral:  Clinical Social Work  Discharge planning Services  CM Consult  Post Acute Care Choice:  NA Choice offered to:  NA  DME Arranged:    DME Agency:     HH Arranged:    HH Agency:     Status of Service:  Completed, signed off  Medicare Important Message Given:  Yes-second notification given Date Medicare IM Given:    Medicare IM give by:    Date Additional Medicare IM Given:    Additional Medicare Important Message give by:     If discussed at Long Length of Stay Meetings, dates discussed:    Additional Comments: PT has recommended SNF. CSW will work with pt to arrange for placement. No CM needs at this time.  Malcolm Metro, RN 07/19/2015, 2:35 PM

## 2015-07-19 NOTE — Clinical Social Work Placement (Signed)
   CLINICAL SOCIAL WORK PLACEMENT  NOTE  Date:  07/19/2015  Patient Details  Name: Edwin [name] MRN: [id number] Date of Birth: 26-Oct-1951  Clinical Social Work is seeking post-discharge placement for this patient at the Skilled  Nursing Facility level of care (*CSW will initial, date and re-position this form in  chart as items are completed):  Yes   Patient/family provided with [facility] Clinical Social Work Department's list of facilities offering this level of care within the geographic area requested by the patient (or if unable, by the patient's family).  Yes   Patient/family informed of their freedom to choose among providers that offer the needed level of care, that participate in Medicare, Medicaid or managed care program needed by the patient, have an available bed and are willing to accept the patient.  Yes   Patient/family informed of [facility]'s ownership interest in [facility] and [facility], as well as of the fact that they are under no obligation to receive care at these facilities.  PASRR submitted to EDS on       PASRR number received on       Existing PASRR number confirmed on 07/19/15     FL2 transmitted to all facilities in geographic area requested by pt/family on 07/19/15     FL2 transmitted to all facilities within larger geographic area on       Patient informed that his/her managed care company has contracts with or will negotiate with certain facilities, including the following:        Yes   Patient/family informed of bed offers received.  Patient chooses bed at [facility]     Physician recommends and patient chooses bed at      Patient to be transferred to   on  .  Patient to be transferred to facility by       Patient family notified on   of transfer.  Name of family member notified:        PHYSICIAN       Additional Comment:    _______________________________________________ Karn Cassis, LCSW 07/19/2015, 4:01  PM [phone]

## 2015-07-19 NOTE — Clinical Social Work Note (Signed)
Clinical Social Work Assessment  Patient Details  Name: Edwin [name] MRN: [id number] Date of Birth: 11/05/1950  Date of referral:  07/19/15               Reason for consult:  Facility Placement                Permission sought to share information with:  Family Supports Permission granted to share information::  Yes, Verbal Permission Granted  Name::     Edwin [name]::     Relationship::  son  Contact Information:     Housing/Transportation Living arrangements for the past 2 months:  Single Family Home Source of Information:  Patient, Adult Children Patient Interpreter Needed:  None Criminal Activity/Legal Involvement Pertinent to Current Situation/Hospitalization:  No - Comment as needed Significant Relationships:  Adult Children Lives with:  Self Do you feel safe going back to the place where you live?  Yes Need for family participation in patient care:  Yes (Comment)  Care giving concerns:  Pt lives alone.    Social Worker assessment / plan:  CSW met with pt at bedside. Pt alert and oriented and reports he lives alone. He is known to CSW from previous admission when pt d/c to SNF. Pt reports he has been home from [facility] for about 6 months now. His son, Edwin [name] is very involved and supportive. Pt indicates Edwin [name] handles most things for him. Pt feels he manages well at home. He uses a cane when he goes outside the home. PT evaluated pt and recommend SNF at d/c. Pt admits to feeling weak, but was hesitant to commit to SNF again. CSW and pt spoke with his son on the phone. After this conversation, pt willing to agree and requests Edwin [name] again. Referral faxed. Aware of Medicare coverage/criteria.   Employment status:  Retired Forensic scientist:  Medicare PT Recommendations:  Edwin [name] / Referral to community resources:  Edwin [name]  Patient/Family's Response to care:  Pt and son agreeable to short term SNF.    Patient/Family's Understanding of and Emotional Response to Diagnosis, Current Treatment, and Prognosis:  Pt and son report understanding of admission diagnosis and treatment plan.   Emotional Assessment Appearance:  Appears stated age Attitude/Demeanor/Rapport:  Other (cooperative) Affect (typically observed):  Accepting Orientation:  Oriented to Self, Oriented to Place, Oriented to Situation Alcohol / Substance use:  Not Applicable Psych involvement (Current and /or in the community):  No (Comment)  Discharge Needs  Concerns to be addressed:  Discharge Planning Concerns Readmission within the last 30 days:  No Current discharge risk:  Lives alone Barriers to Discharge:  Continued Medical Work up   Edwin [name], Edwin [name] 07/19/2015, 3:17 PM [phone]

## 2015-07-19 NOTE — Progress Notes (Signed)
[facility] PROGRESS NOTE  Edwin [name] ZOX:[id number] DOB: Jul 29, 1951 DOA: 07/16/2015 PCP: Lynwood Dawley, PA-C  Assessment/Plan: 1. Acute kidney injury.  Improving with vigorous IV fluids. HCTZ and lisinopril withheld. A renal ultrasound revealed mild left hydronephrosis and a minimally complex right renal cyst; normal echogenicity. Evaluated by nephrology who opine related to combination of pre-renal syndrome/ATN/ACE inhibitor. Defer management of fluids to nephrology. Urine output good. Continue to monitor  Pyuria and bacteriuria, presumed UTI. - Patient has a history of UTI and has been treated with Cipro and suppressive therapy with nitrofurantoin.  UTI with Rocephin day#4. Urine culture with >100,000 colonies enterococcus sensitive to levaquin. . Blood cultures no growth to date  Acute DVT  patient has a history of right lower extremity DVT-status post IVC filter. Eliquis was apparently discontinued when he developed hematuria last year. Evaluated by Hematology who recommend resuming eliquis (after review of urology notes, see Heme note dated 07/19/15) and discontinuing IV heparin. Await results of VQ scan  Hypotension.  The patient's blood pressure was in the 70s systolically in the ED. Resolved with IV fluid hydration. Presumed etiology of his hypotension was from hypovolemia in the setting of a diuretic therapy and poor oral intake. I doubt sepsis. We'll continue IV fluid hydration, adjustment and rate per nephrology.  Hypokalemia.  Patient's serum potassium was 3.2 on admission, likely secondary to HCTZ.  Resolved with supplement in IV.  Mild hyponatremia. The patient's serum sodium was 133 on admission, likely secondary to hypovolemia. Resolved.    Pernicious anemia/ vitamin B12 deficiency.  The patient has a long-standing history of anemia, secondary to vitamin B12 deficiency. The patient is followed by hematology clinic. He receives vitamin B12 injections on a regular  basis. -Per chart review, his baseline Hg ranges from 12.1-14 over the past 7 months. On admission it was 10.4. His hemoglobin has drifted down, secondary to the dilutional effects of IV fluids. -His anemia panel during this hospitalization revealed total iron of 10, ferritin of 239, B12 of 641, and reticulocyte count of 32. --Consider outpatient GI consult given that he has no history of gross melena or bright red blood per rectum. -has follow up appointment with Heme. Next B12 injection due 10/18  Mildly low TSH. Doubt clinical hyperthyroidism. Free T4 with in limits of normal.  Code Status: full Family Communication: none present Disposition Plan: home hopefully 2 days   Consultants:  Nephrology  hematology  Procedures:  none  Antibiotics:  Rocephin 07/16/15  HPI/Subjective: Lying in bed reports abdominal pain/no appetite. Reports no BM for 5 days.  Objective: Filed Vitals:   07/19/15 0603  BP: 144/69  Pulse: 81  Temp: 98.7 F (37.1 C)  Resp: 20    Intake/Output Summary (Last 24 hours) at 07/19/15 1056 Last data filed at 07/19/15 0500  Gross per 24 hour  Intake 2781.62 ml  Output    950 ml  Net 1831.62 ml   Filed Weights   07/17/15 0500 07/18/15 0500 07/19/15 0603  Weight: 90.5 kg (199 lb 8.3 oz) 92.6 kg (204 lb 2.3 oz) 92.8 kg (204 lb 9.4 oz)    Exam:   General:  Very unkept, looking older than age somewhat uncomfortable  Cardiovascular: RRR no MGR LLE 1+ edema, right LE trace edema  Respiratory: normal effort BS distant but clear  Abdomen: slightly firm mild tenderness to palpation lower quadrants particularly on right sluggish BS  Musculoskeletal: no clubbing or cyanosis   Data Reviewed: Basic Metabolic Panel:  Recent Labs Lab 07/16/15  1255 07/17/15 0456 07/18/15 0410 07/19/15 0522  NA 133* 137 138 140  K 3.2* 3.3* 3.8 4.3  CL 99* 105 110 111  CO2 GLUCOSE 123* 104* 144* 110*  BUN 46* 41* 29* 19  CREATININE 8.62* 5.67*  2.50* 1.47*  CALCIUM 8.4* 7.8* 8.2* 8.6*  PHOS  --  4.0 2.4* 3.0   Liver Function Tests:  Recent Labs Lab 07/16/15 1255 07/17/15 0456 07/18/15 0410 07/19/15 0522  AST 34  34  --   --   --   ALT 25  25  --   --   --   ALKPHOS 67  66  --   --   --   BILITOT 0.8  0.8  --   --   --   PROT 7.7  7.6  --   --   --   ALBUMIN 3.4*  3.3* 2.7* 2.8* 2.8*   No results for input(s): LIPASE, AMYLASE in the last 168 hours. No results for input(s): AMMONIA in the last 168 hours. CBC:  Recent Labs Lab 07/16/15 1255 07/17/15 0456 07/18/15 0410 07/19/15 0522  WBC 11.5* 7.6 7.2 7.0  NEUTROABS 9.2*  --   --   --   HGB 10.4* 9.5* 10.1* 9.9*  HCT 31.7* 29.3* 31.6* 31.2*  MCV 88.3 90.2 89.5 89.9  PLT 303 247 328 390   Cardiac Enzymes: No results for input(s): CKTOTAL, CKMB, CKMBINDEX, TROPONINI in the last 168 hours. BNP (last 3 results) No results for input(s): BNP in the last 8760 hours.  ProBNP (last 3 results)  Recent Labs  08/13/14 2224  PROBNP 82.8    CBG: No results for input(s): GLUCAP in the last 168 hours.  Recent Results (from the past 240 hour(s))  Blood culture (routine x 2)     Status: None (Preliminary result)   Collection Time: 07/16/15  2:10 PM  Result Value Ref Range Status   Specimen Description BLOOD LEFT ANTECUBITAL  Final   Special Requests BOTTLES DRAWN AEROBIC AND ANAEROBIC 6CC EACH  Final   Culture NO GROWTH 3 DAYS  Final   Report Status PENDING  Incomplete  Blood culture (routine x 2)     Status: None (Preliminary result)   Collection Time: 07/16/15  2:17 PM  Result Value Ref Range Status   Specimen Description BLOOD RIGHT ANTECUBITAL  Final   Special Requests BOTTLES DRAWN AEROBIC ONLY 4CC  Final   Culture NO GROWTH 3 DAYS  Final   Report Status PENDING  Incomplete  Urine culture     Status: None   Collection Time: 07/16/15  2:30 PM  Result Value Ref Range Status   Specimen Description URINE, CATHETERIZED  Final   Special Requests NONE   Final   Culture   Final    >=100,000 COLONIES/mL ENTEROCOCCUS SPECIES Performed at [facility]    Report Status 07/19/2015 FINAL  Final   Organism ID, Bacteria ENTEROCOCCUS SPECIES  Final      Susceptibility   Enterococcus species - MIC*    AMPICILLIN <=2 SENSITIVE Sensitive     LEVOFLOXACIN >=8 RESISTANT Resistant     NITROFURANTOIN 32 SENSITIVE Sensitive     VANCOMYCIN 2 SENSITIVE Sensitive     * >=100,000 COLONIES/mL ENTEROCOCCUS SPECIES  MRSA PCR Screening     Status: None   Collection Time: 07/16/15  3:45 PM  Result Value Ref Range Status   MRSA by PCR NEGATIVE NEGATIVE Final    Comment:  The GeneXpert MRSA Assay (FDA approved for NASAL specimens only), is one component of a comprehensive MRSA colonization surveillance program. It is not intended to diagnose MRSA infection nor to guide or monitor treatment for MRSA infections.      Studies: US Venous Img Lower Bilateral  07/17/2015   ADDENDUM REPORT: 07/17/2015 19:20 ADDENDUM: Critical Value/emergent results were called by telephone at the time of interpretation on 07/17/2015 at 7:20 pm to Dr. Sharl Ma, who verbally acknowledged these results. Electronically Signed   By: Lupita Raider, M.D.   On: 07/17/2015 19:20  07/17/2015   CLINICAL DATA:  Bilateral lower extremity edema.  EXAM: BILATERAL LOWER EXTREMITY VENOUS DOPPLER ULTRASOUND  TECHNIQUE: Gray-scale sonography with graded compression, as well as color Doppler and duplex ultrasound were performed to evaluate the lower extremity deep venous systems from the level of the common femoral vein and including the common femoral, femoral, profunda femoral, popliteal and calf veins including the posterior tibial, peroneal and gastrocnemius veins when visible. The superficial great saphenous vein was also interrogated. Spectral Doppler was utilized to evaluate flow at rest and with distal augmentation maneuvers in the common femoral, femoral and popliteal veins.   COMPARISON:  None.  FINDINGS: RIGHT LOWER EXTREMITY  Common Femoral Vein: No evidence of thrombus. Normal compressibility, respiratory phasicity and response to augmentation.  Saphenofemoral Junction: No evidence of thrombus. Normal compressibility and flow on color Doppler imaging.  Profunda Femoral Vein: No evidence of thrombus. Normal compressibility and flow on color Doppler imaging.  Femoral Vein: Noncompressible with occlusive thrombus present.  Popliteal Vein: Noncompressible with occlusive thrombus present.  Calf Veins: Not well visualized.  Superficial Great Saphenous Vein: No evidence of thrombus. Normal compressibility and flow on color Doppler imaging.  Venous Reflux:  None.  Other Findings:  None.  LEFT LOWER EXTREMITY  Common Femoral Vein: Noncompressible due to nonocclusive thrombus.  Saphenofemoral Junction: No evidence of thrombus. Normal compressibility and flow on color Doppler imaging.  Profunda Femoral Vein: No evidence of thrombus. Normal compressibility and flow on color Doppler imaging.  Femoral Vein: Noncompressible secondary to occlusive thrombus.  Popliteal Vein: Noncompressible secondary to occlusive thrombus.  Calf Veins: Not well visualized.  Superficial Great Saphenous Vein: No evidence of thrombus. Normal compressibility and flow on color Doppler imaging.  Venous Reflux:  None.  Other Findings:  None.  IMPRESSION: Findings consistent with acute deep venous thrombosis involving the superficial femoral and popliteal veins bilaterally. These results will be called to the ordering clinician or representative by the Radiologist Assistant, and communication documented in the PACS or zVision Dashboard.  Electronically Signed: By: Lupita Raider, M.D. On: 07/17/2015 19:16    Scheduled Meds: . cefTRIAXone (ROCEPHIN)  IV  1 g Intravenous Q24H  . clonazePAM  0.5 mg Oral Daily  . cyanocobalamin  1,000 mcg Intramuscular Weekly  . docusate sodium  100 mg Oral BID  . feeding supplement  (ENSURE ENLIVE)  237 mL Oral BID BM  . folic acid  1 mg Oral Daily  . gabapentin  100 mg Oral QHS  . multivitamin-prenatal  1 tablet Oral Q1200  . pantoprazole  40 mg Oral Daily  . tamsulosin  0.4 mg Oral Daily   Continuous Infusions: . 0.9 % NaCl with KCl 20 mEq / L 100 mL/hr at 07/19/15 0852  . heparin 1,900 Units/hr (07/19/15 0854)    Principal Problem:   Generalized weakness Active Problems:   Megaloblastic anemia due to B12 deficiency   Acute kidney injury   Volume  depletion   Hypotension   Hypokalemia   Bilateral lower extremity edema   UTI (urinary tract infection)   Normocytic anemia   DVT, bilateral lower limbs    Time spent: 30 minutes    [facility] M  [facility] Pager [phone]. If 7PM-7AM, please contact night-coverage at [email or web address], password [facility] 07/19/2015, 10:56 AM  LOS: 3 days

## 2015-07-19 NOTE — Progress Notes (Signed)
ANTICOAGULATION CONSULT NOTE  Pharmacy Consult for Heparin Indication: DVT  No Known Allergies  Patient Measurements: Height:  (170.2 cm) Weight: 204 lb 9.4 oz (92.8 kg) IBW/kg (Calculated) : 66.1 HEPARIN DW (KG): 85  Vital Signs: Temp: 98.7 F (37.1 C) (09/23 0603) Temp Source: Oral (09/23 0603) BP: 144/69 mmHg (09/23 0603) Pulse Rate: 81 (09/23 0603)  Labs:  Recent Labs  07/17/15 0456 07/17/15 1943  07/18/15 0410 07/18/15 1434 07/18/15 2207 07/19/15 0522  HGB 9.5*  --   --  10.1*  --   --  9.9*  HCT 29.3*  --   --  31.6*  --   --  31.2*  PLT 247  --   --  328  --   --  390  LABPROT  --  15.5*  --   --   --   --   --   INR  --  1.22  --   --   --   --   --   HEPARINUNFRC  --   --   < > <0.10* 0.41 0.32 0.24*  CREATININE 5.67*  --   --  2.50*  --   --  1.47*  < > = values in this interval not displayed.  Estimated Creatinine Clearance: 53.7 mL/min (by C-G formula based on Cr of 1.47).  Past Medical History  Diagnosis Date  . Encephalopathy   . UTI (lower urinary tract infection)   . Renal disorder     Bladder stone,Bladder diverticulm  . Hx of sepsis 12/15  . Peripheral vascular disease 10/11/14    acute embolism and thrombosis Rt femoral vein  . Dysphagia 10/11/14  . Blood dyscrasia     pancytopenia  . Anemia     nutritional anemia  . Anemia     posthemorrhagic anemia  . Neuromuscular disorder     muscle weakness  . Failure to thrive in adult 08/22/14  . S/P IVC filter 10/05/2014    Sec to hematuria on Eliquis  . Right leg DVT 08/14/2014    R. femoral vein  . Pancytopenia 08/14/2014  . Megaloblastic anemia due to B12 deficiency 08/15/2014  . Bladder calculus 10/06/2014  . Essential hypertension 01/31/2015  . BPH (benign prostatic hyperplasia) 01/31/2015  . Sepsis 10/05/2014  . Acute encephalopathy 08/14/2014    Medications:  Scheduled:  . cefTRIAXone (ROCEPHIN)  IV  1 g Intravenous Q24H  . clonazePAM  0.5 mg Oral Daily  . cyanocobalamin   1,000 mcg Intramuscular Weekly  . docusate sodium  100 mg Oral BID  . feeding supplement (ENSURE ENLIVE)  237 mL Oral BID BM  . folic acid  1 mg Oral Daily  . gabapentin  100 mg Oral QHS  . multivitamin-prenatal  1 tablet Oral Q1200  . pantoprazole  40 mg Oral Daily  . tamsulosin  0.4 mg Oral Daily   Assessment: Okay for Protocol, baseline anticoag labs WNL.  [place] = acute deep venous thrombosis. Hx DVT previously treated w/ DOAC which was stopped due to hematuria per MD note.  Also s/p IVC filter.  Acute renal failure continues to improve.  Hx anemia noted, H/H stable.  Heparin Level below goal, no infusion issues noted per RN.  CBC appears stable.   Goal of Therapy:  Heparin level 0.3-0.7 units/ml Monitor platelets by anticoagulation protocol: Yes   Plan:  Increase heparin infusion to 1900 units/hr Re-Check anti-Xa level in 6-8 hours and daily while on heparin Continue to monitor H&H and platelets  Edwin [name] A 07/19/2015,7:42 AM

## 2015-07-19 NOTE — Evaluation (Signed)
Physical Therapy Evaluation Patient Details Name: Clearance Chenault MRN: [id number] DOB: 07/05/1951 Today's Date: 07/19/2015   History of Present Illness  Pt is a 64 year old male who was admitted with bilateral DVTs.  He has multiple other medical diagnoses.  He lives alone with son nearby and is normally independent at home  Clinical Impression   Pt was seen for evaluation.  He was up in a chair, alert, oriented and cooperative.  He had no pain at rest.  He had significant LE weakness.  He needs min assist for standing from sitting and was only able to ambulate 20' with a walker, extremely slow and labored gait.  His gait was antalgic with pain on weight bearing in the LLE.  His LLE has significant edema present.  I am recommending SNF at d/c.    Follow Up Recommendations SNF    Equipment Recommendations  None recommended by PT    Recommendations for Other Services   none    Precautions / Restrictions Precautions Precautions: Fall Restrictions Weight Bearing Restrictions: No      Mobility  Bed Mobility               General bed mobility comments: up in chair  Transfers Overall transfer level: Needs assistance Equipment used: Rolling walker (2 wheeled) Transfers: Sit to/from Stand Sit to Stand: Min assist            Ambulation/Gait Ambulation/Gait assistance: Min guard Ambulation Distance (Feet): 20 Feet Assistive device: Rolling walker (2 wheeled) Gait Pattern/deviations: Decreased step length - right;Antalgic Gait velocity: very slow Gait velocity interpretation: <1.8 ft/sec, indicative of risk for recurrent falls General Gait Details: gait is extremely labored with report of pain on weight bearing of the LLE  Stairs            Wheelchair Mobility    Modified Rankin (Stroke Patients Only)       Balance Overall balance assessment: No apparent balance deficits (not formally assessed)                                            Pertinent Vitals/Pain Pain Assessment: No/denies pain    Home Living Family/patient expects to be discharged to:: Skilled nursing facility                      Prior Function Level of Independence: Independent         Comments: sometimes uses a cane or walker     Hand Dominance   Dominant Hand: Right    Extremity/Trunk Assessment               Lower Extremity Assessment: Generalized weakness (both LEs very edematous, L>R)         Communication   Communication: No difficulties  Cognition Arousal/Alertness: Awake/alert Behavior During Therapy: WFL for tasks assessed/performed Overall Cognitive Status: Within Functional Limits for tasks assessed                      General Comments      Exercises        Assessment/Plan    PT Assessment Patient needs continued PT services  PT Diagnosis Difficulty walking;Generalized weakness;Abnormality of gait   PT Problem List Decreased strength;Decreased activity tolerance;Decreased mobility  PT Treatment Interventions Gait training;Functional mobility training;Therapeutic exercise   PT Goals (Current goals can be  found in the Care Plan section) Acute Rehab PT Goals Patient Stated Goal: none stated PT Goal Formulation: With patient Time For Goal Achievement: 08/02/15 Potential to Achieve Goals: Good    Frequency Min 3X/week   Barriers to discharge Decreased caregiver support lives alone    Co-evaluation               End of Session Equipment Utilized During Treatment: Gait belt Activity Tolerance: Patient tolerated treatment well;Patient limited by fatigue Patient left: in chair;with call bell/phone within reach;with chair alarm set Nurse Communication: Mobility status         Time: 1345-1410 PT Time Calculation (min) (ACUTE ONLY): 25 min   Charges:   PT Evaluation $Initial PT Evaluation Tier I: 1 Procedure     PT G CodesMyrlene Broker L  PT 07/19/2015,  2:20 PM [phone]

## 2015-07-19 NOTE — Progress Notes (Signed)
Subjective: Notes ascertained from [facility] where he follows with Dr. Retta Diones.  According to documentation, [name] underwent cystoscopy, cystolitholapaxy of a large bladder diverticula or stone as well as transurethral incision of his prostate on 11/22/2014.  Since this time, he has not had any gross hematuria according to urology.    I personally reviewed and went over laboratory results with the patient.  The results are noted within this dictation.  I personally reviewed and went over radiographic studies with the patient.  The results are noted within this dictation.  V/Q scan demonstrates low probability of PE.  Patient's son was at the bedside.  All questions answered regarding the patient's DVT, IVC filter, role of anticoagulation, risks and benefits of therapy, UTI, renal function, etc.  Objective: Vital signs in last 24 hours: Temp:  [98.3 F (36.8 C)-99.1 F (37.3 C)] 98.7 F (37.1 C) (09/23 0603) Pulse Rate:  [78-93] 81 (09/23 0603) Resp:  [19-20] 20 (09/23 0603) BP: (135-145)/(63-69) 144/69 mmHg (09/23 0603) SpO2:  [95 %-99 %] 95 % (09/23 0603) Weight:  [204 lb 9.4 oz (92.8 kg)] 204 lb 9.4 oz (92.8 kg) (09/23 0603)  Intake/Output from previous day: 09/22 0800 - 09/23 0759 In: 3021.6 [P.O.:360; I.V.:2661.6] Out: 950 [Urine:950] Intake/Output this shift:    General appearance: alert, cooperative, no distress and slowed mentation  Lab Results:   Recent Labs  07/18/15 0410 07/19/15 0522  WBC 7.2 7.0  HGB 10.1* 9.9*  HCT 31.6* 31.2*  PLT 328 390   BMET  Recent Labs  07/18/15 0410 07/19/15 0522  NA 138 140  K 3.8 4.3  CL 110 111  CO2 22 22  GLUCOSE 144* 110*  BUN 29* 19  CREATININE 2.50* 1.47*  CALCIUM 8.2* 8.6*    Studies/Results: US Venous Img Lower Bilateral  07/17/2015   ADDENDUM REPORT: 07/17/2015 19:20 ADDENDUM: Critical Value/emergent results were called by telephone at the time of interpretation on 07/17/2015 at 7:20 pm to Dr. Sharl Ma,  who verbally acknowledged these results. Electronically Signed   By: Lupita Raider, M.D.   On: 07/17/2015 19:20  07/17/2015   CLINICAL DATA:  Bilateral lower extremity edema.  EXAM: BILATERAL LOWER EXTREMITY VENOUS DOPPLER ULTRASOUND  TECHNIQUE: Gray-scale sonography with graded compression, as well as color Doppler and duplex ultrasound were performed to evaluate the lower extremity deep venous systems from the level of the common femoral vein and including the common femoral, femoral, profunda femoral, popliteal and calf veins including the posterior tibial, peroneal and gastrocnemius veins when visible. The superficial great saphenous vein was also interrogated. Spectral Doppler was utilized to evaluate flow at rest and with distal augmentation maneuvers in the common femoral, femoral and popliteal veins.  COMPARISON:  None.  FINDINGS: RIGHT LOWER EXTREMITY  Common Femoral Vein: No evidence of thrombus. Normal compressibility, respiratory phasicity and response to augmentation.  Saphenofemoral Junction: No evidence of thrombus. Normal compressibility and flow on color Doppler imaging.  Profunda Femoral Vein: No evidence of thrombus. Normal compressibility and flow on color Doppler imaging.  Femoral Vein: Noncompressible with occlusive thrombus present.  Popliteal Vein: Noncompressible with occlusive thrombus present.  Calf Veins: Not well visualized.  Superficial Great Saphenous Vein: No evidence of thrombus. Normal compressibility and flow on color Doppler imaging.  Venous Reflux:  None.  Other Findings:  None.  LEFT LOWER EXTREMITY  Common Femoral Vein: Noncompressible due to nonocclusive thrombus.  Saphenofemoral Junction: No evidence of thrombus. Normal compressibility and flow on color Doppler imaging.  Profunda Femoral  Vein: No evidence of thrombus. Normal compressibility and flow on color Doppler imaging.  Femoral Vein: Noncompressible secondary to occlusive thrombus.  Popliteal Vein: Noncompressible  secondary to occlusive thrombus.  Calf Veins: Not well visualized.  Superficial Great Saphenous Vein: No evidence of thrombus. Normal compressibility and flow on color Doppler imaging.  Venous Reflux:  None.  Other Findings:  None.  IMPRESSION: Findings consistent with acute deep venous thrombosis involving the superficial femoral and popliteal veins bilaterally. These results will be called to the ordering clinician or representative by the Radiologist Assistant, and communication documented in the PACS or zVision Dashboard.  Electronically Signed: By: Lupita Raider, M.D. On: 07/17/2015 19:16    Medications: I have reviewed the patient's current medications.  Assessment/Plan: 1. B/L LE DVT, new/acute.  Previously on Eliquis in October 2015 with first onset of DVT in the setting of negative hypercoag panel and severe B12 deficiency.  Started on Eliquis in 2015 and within 24 hours, gross hematuria noted.  Eliquis was discontinued and IVC filter was placed in October 2015.  He has been off anticoagulation since.  Now with new B/L LE DVT.  Chest xray and V/Q scan ordered to evaluate for PE given decreased breath sounds in bases and current renal function. Urology notes reviewed.  The patient is S/P cystoscopy, cystolitholapaxy of a large bladder diverticula or stone as well as transurethral incision of his prostate on 11/22/2014 by Dr. Retta Diones with resolution of gross hematuria.  In the setting of new DVT and thrombogenicity of IVC filter, recommend restarting Eliquis.  I will order Eliquis 10 mg BID x 7 days, followed by 5 mg BID daily thereafter.  Heparin can be discontinued. 2. Anemia, normocytic, normochromic likely from chronic disease.  Iron deficiency anemia continues to be documented in his chart, but iron indices do not indicate iron deficiency. 3. Pernicious anemia, due for next injection of 1000 mcg in 4 weeks. 4. Hematollogy will sign off at this time.  Please call if patient is needed to be  seen while in the hospital.  Next B12 injection is scheduled for 10/18.  Will make a follow-up appointment with the patient on the same day.  By then, he will be on Eliquis 5 mg BID.  Please send patient home with a 1-2 month supply of Eliquis and we will refill in the outpatient setting.  Patient and plan discussed with Dr. Loma Messing and she is in agreement with the aforementioned.     LOS: 3 days    KEFALAS,THOMAS 07/19/2015   AS above. Would restart eliquis, follow closely for hematuria.  Hopefully he experiences no further bleeding complication but at this point I feel he must be anticoagulated.  We will follow-up in the clinic on 10/18.  The patient was advised to call us or present to the ED with any bleeding complication.  Chiquita Loth MD

## 2015-07-19 NOTE — Progress Notes (Signed)
Subjective: Patient feeling much better. He denies any nausea or vomiting. He denies also any difficulty in breathing.   Objective: Vital signs in last 24 hours: Temp:  [98.3 F (36.8 C)-99.1 F (37.3 C)] 98.7 F (37.1 C) (09/23 0603) Pulse Rate:  [78-93] 81 (09/23 0603) Resp:  [19-21] 20 (09/23 0603) BP: (127-145)/(60-69) 144/69 mmHg (09/23 0603) SpO2:  [95 %-100 %] 95 % (09/23 0603) Weight:  [204 lb 9.4 oz (92.8 kg)] 204 lb 9.4 oz (92.8 kg) (09/23 0603)  Intake/Output from previous day: 09/22 0701 - 09/23 0700 In: 3021.6 [P.O.:360; I.V.:2661.6] Out: 950 [Urine:950] Intake/Output this shift:     Recent Labs  07/16/15 1255 07/17/15 0456 07/18/15 0410 07/19/15 0522  HGB 10.4* 9.5* 10.1* 9.9*    Recent Labs  07/18/15 0410 07/19/15 0522  WBC 7.2 7.0  RBC 3.53* 3.47*  HCT 31.6* 31.2*  PLT 328 390    Recent Labs  07/18/15 0410 07/19/15 0522  NA 138 140  K 3.8 4.3  CL 110 111  CO2 22 22  BUN 29* 19  CREATININE 2.50* 1.47*  GLUCOSE 144* 110*  CALCIUM 8.2* 8.6*    Recent Labs  07/17/15 1943  INR 1.22    Generally patient is alert and in no apparent distress Chest is clear to auscultation Heart exam reveals regular rate and rhythm no murmur or S3 Abdomen: Soft positive bowel sounds  extremities no edema  Assessment/Plan: Problem #1 acute kidney injury: Possibly a combination of prerenal syndrome/ATN/ACE inhibitor. Presently his renal function is improving. Patient is asymptomatic. Patient has about 3700 mL of urine output. Problem #2 hypokalemia: Patient is on potassium supplement presently his potassium is normal Problem #3 hyponatremia: His sodium has corrected Problem #4 anemia: Taught to be secondary to iron deficiency anemia. Problem #5 bilateral DVT: Patient on heparin Problem #6 history of mild left hydronephrosis. Problem #7 UTI: Patient is on antibiotics. She is a febrile and his white blood cell count is normal. Plan: 1] We'll decrease IV  fluids to 75 mL per hour 2] we'll check basic metabolic panel in the morning   BEFEKADU,BELAYENH S 07/19/2015, 9:08 AM

## 2015-07-20 DIAGNOSIS — R2242 Localized swelling, mass and lump, left lower limb: Secondary | ICD-10-CM | POA: Diagnosis not present

## 2015-07-20 DIAGNOSIS — N178 Other acute kidney failure: Secondary | ICD-10-CM | POA: Diagnosis not present

## 2015-07-20 DIAGNOSIS — M6281 Muscle weakness (generalized): Secondary | ICD-10-CM | POA: Diagnosis not present

## 2015-07-20 DIAGNOSIS — I959 Hypotension, unspecified: Secondary | ICD-10-CM | POA: Diagnosis not present

## 2015-07-20 DIAGNOSIS — R41841 Cognitive communication deficit: Secondary | ICD-10-CM | POA: Diagnosis not present

## 2015-07-20 DIAGNOSIS — N200 Calculus of kidney: Secondary | ICD-10-CM | POA: Diagnosis not present

## 2015-07-20 DIAGNOSIS — R2243 Localized swelling, mass and lump, lower limb, bilateral: Secondary | ICD-10-CM | POA: Diagnosis not present

## 2015-07-20 DIAGNOSIS — E869 Volume depletion, unspecified: Secondary | ICD-10-CM | POA: Diagnosis not present

## 2015-07-20 DIAGNOSIS — R260 Ataxic gait: Secondary | ICD-10-CM | POA: Diagnosis not present

## 2015-07-20 DIAGNOSIS — E871 Hypo-osmolality and hyponatremia: Secondary | ICD-10-CM | POA: Diagnosis not present

## 2015-07-20 DIAGNOSIS — E559 Vitamin D deficiency, unspecified: Secondary | ICD-10-CM | POA: Diagnosis not present

## 2015-07-20 DIAGNOSIS — R600 Localized edema: Secondary | ICD-10-CM | POA: Diagnosis not present

## 2015-07-20 DIAGNOSIS — I82403 Acute embolism and thrombosis of unspecified deep veins of lower extremity, bilateral: Secondary | ICD-10-CM | POA: Diagnosis not present

## 2015-07-20 DIAGNOSIS — N390 Urinary tract infection, site not specified: Secondary | ICD-10-CM | POA: Diagnosis not present

## 2015-07-20 DIAGNOSIS — E538 Deficiency of other specified B group vitamins: Secondary | ICD-10-CM | POA: Diagnosis not present

## 2015-07-20 DIAGNOSIS — Z743 Need for continuous supervision: Secondary | ICD-10-CM | POA: Diagnosis not present

## 2015-07-20 DIAGNOSIS — G320 Subacute combined degeneration of spinal cord in diseases classified elsewhere: Secondary | ICD-10-CM | POA: Diagnosis not present

## 2015-07-20 DIAGNOSIS — D649 Anemia, unspecified: Secondary | ICD-10-CM | POA: Diagnosis not present

## 2015-07-20 DIAGNOSIS — R531 Weakness: Secondary | ICD-10-CM | POA: Diagnosis not present

## 2015-07-20 DIAGNOSIS — R279 Unspecified lack of coordination: Secondary | ICD-10-CM | POA: Diagnosis not present

## 2015-07-20 DIAGNOSIS — I82409 Acute embolism and thrombosis of unspecified deep veins of unspecified lower extremity: Secondary | ICD-10-CM | POA: Diagnosis not present

## 2015-07-20 DIAGNOSIS — N179 Acute kidney failure, unspecified: Principal | ICD-10-CM

## 2015-07-20 DIAGNOSIS — Z79899 Other long term (current) drug therapy: Secondary | ICD-10-CM | POA: Diagnosis not present

## 2015-07-20 DIAGNOSIS — D531 Other megaloblastic anemias, not elsewhere classified: Secondary | ICD-10-CM | POA: Diagnosis not present

## 2015-07-20 DIAGNOSIS — E876 Hypokalemia: Secondary | ICD-10-CM | POA: Diagnosis not present

## 2015-07-20 DIAGNOSIS — N400 Enlarged prostate without lower urinary tract symptoms: Secondary | ICD-10-CM | POA: Diagnosis not present

## 2015-07-20 DIAGNOSIS — R278 Other lack of coordination: Secondary | ICD-10-CM | POA: Diagnosis not present

## 2015-07-20 LAB — CBC
HEMATOCRIT: 30.7 % — AB (ref 39.0–52.0)
HEMOGLOBIN: 10.1 g/dL — AB (ref 13.0–17.0)
MCH: 29.5 pg (ref 26.0–34.0)
MCHC: 32.9 g/dL (ref 30.0–36.0)
MCV: 89.8 fL (ref 78.0–100.0)
Platelets: 388 10*3/uL (ref 150–400)
RBC: 3.42 MIL/uL — AB (ref 4.22–5.81)
RDW: 14.5 % (ref 11.5–15.5)
WBC: 6.5 10*3/uL (ref 4.0–10.5)

## 2015-07-20 LAB — BASIC METABOLIC PANEL
ANION GAP: 6 (ref 5–15)
BUN: 16 mg/dL (ref 6–20)
CALCIUM: 8.7 mg/dL — AB (ref 8.9–10.3)
CHLORIDE: 111 mmol/L (ref 101–111)
CO2: 22 mmol/L (ref 22–32)
Creatinine, Ser: 1.17 mg/dL (ref 0.61–1.24)
GFR calc non Af Amer: >60 mL/min (ref >60)
GLUCOSE: 103 mg/dL — AB (ref 65–99)
POTASSIUM: 3.7 mmol/L (ref 3.5–5.1)
Sodium: 139 mmol/L (ref 135–145)

## 2015-07-20 MED ORDER — CYANOCOBALAMIN 1000 MCG/ML IJ SOLN: 1000.0000 ug | INTRAMUSCULAR | Status: DC

## 2015-07-20 MED ORDER — APIXABAN 5 MG PO TABS: 10.0000 mg | ORAL_TABLET | Freq: Two times a day (BID) | ORAL | Status: DC

## 2015-07-20 NOTE — Progress Notes (Signed)
Received call from nurse on unit that patient to be discharged and patient going to [facility].  CM contacted Child psychotherapist and left message. CM contacted [name] to confirm if patient has a room available for admission.  Spoke with Hilda Lias and  CM faxed over the discharge papers as needed for his admission.

## 2015-07-20 NOTE — Discharge Instructions (Signed)
Information on my medicine - ELIQUIS (apixaban)  This medication education was reviewed with me or my healthcare representative as part of my discharge preparation.  Why was Eliquis prescribed for you? Eliquis was prescribed to treat blood clots that may have been found in the veins of your legs (deep vein thrombosis) or in your lungs (pulmonary embolism) and to reduce the risk of them occurring again.  What do You need to know about Eliquis ? The starting dose is 10 mg (two 5 mg tablets) taken TWICE daily for the FIRST SEVEN (7) DAYS, then on (enter date)  07/26/15  the dose is reduced to ONE 5 mg tablet taken TWICE daily.  Eliquis may be taken with or without food.   Try to take the dose about the same time in the morning and in the evening. If you have difficulty swallowing the tablet whole please discuss with your pharmacist how to take the medication safely.  Take Eliquis exactly as prescribed and DO NOT stop taking Eliquis without talking to the doctor who prescribed the medication.  Stopping may increase your risk of developing a new blood clot.  Refill your prescription before you run out.  After discharge, you should have regular check-up appointments with your healthcare provider that is prescribing your Eliquis.    What do you do if you miss a dose? If a dose of ELIQUIS is not taken at the scheduled time, take it as soon as possible on the same day and twice-daily administration should be resumed. The dose should not be doubled to make up for a missed dose.  Important Safety Information A possible side effect of Eliquis is bleeding. You should call your healthcare provider right away if you experience any of the following: ? Bleeding from an injury or your nose that does not stop. ? Unusual colored urine (red or dark brown) or unusual colored stools (red or black). ? Unusual bruising for unknown reasons. ? A serious fall or if you hit your head (even if there is no  bleeding).  Some medicines may interact with Eliquis and might increase your risk of bleeding or clotting while on Eliquis. To help avoid this, consult your healthcare provider or pharmacist prior to using any new prescription or non-prescription medications, including herbals, vitamins, non-steroidal anti-inflammatory drugs (NSAIDs) and supplements.  This website has more information on Eliquis (apixaban): http://www.eliquis.com/eliquis/home

## 2015-07-20 NOTE — Discharge Summary (Signed)
Physician Discharge Summary  Edwin [name] ZOX:[id number] DOB: September 07, 1951 DOA: 07/16/2015  PCP: Lynwood Dawley, PA-C  Admit date: 07/16/2015 Discharge date: 07/20/2015  Time spent: 45 minutes  Recommendations for Outpatient Follow-up:  -Will be discharged to SNF today. -In 14 days eliquis dose can be decreased to 5 mg BID. -Follow renal function in 3 days and decide if appropriate to restart lisnopril/HCTZ.   Discharge Diagnoses:  Principal Problem:   Generalized weakness Active Problems:   Megaloblastic anemia due to B12 deficiency   Acute kidney injury   Volume depletion   Hypotension   Hypokalemia   Bilateral lower extremity edema   UTI (urinary tract infection)   Normocytic anemia   DVT, bilateral lower limbs   Discharge Condition: Stable and improved  Filed Weights   07/18/15 0500 07/19/15 0603 07/20/15 0603  Weight: 92.6 kg (204 lb 2.3 oz) 92.8 kg (204 lb 9.4 oz) 93.26 kg (205 lb 9.6 oz)    History of present illness:  As per Dr. Sherrie Mustache 9/20: Edwin [name] is a 64 y.o. male with a history of right lower extremity DVT, status post IVC filter due to hematuria on Eliquis, vitamin B12 deficiency, bladder calculus, hypertension, who presents to the emergency department with a chief complaint of generalized weakness. He has had generalized weakness for the past 2 or 3 days. He fell one occasion at home, but he denies head injury. He has had one episode of subjective fever, but no chills. He has had no headache, dizziness, nausea, vomiting or diarrhea. He denies chest pain, shortness of breath, or chest congestion. He denies abdominal pain, flank pain, pain with urination, diarrhea, black tarry stools, bright red blood per rectum, or blood in his urine. He reports not urinating for approximately 24 hours. He denies bladder pain. He reports a poor intake of both fluids and solid foods. He denies dysphagia-like symptoms. He states "I just feel like eating". He has had no changes in  his medications. He denies taking NSAID type medications.  In the ED, he was hypotensive with a blood pressure of 72/47 initially. His chest x-ray reveals no acute cardiopulmonary disease. His lab data are significant for serum sodium of 133, potassium 3.2, BUN 46, creatinine 8.62, glucose 123, normal lactic acid, WBC 11.5, hemoglobin 10.4. His urinalysis revealed many bacteria, too numerous to count WBCs, many squamous cells, and small leukocytes. He is being admitted for further evaluation and management.  Hospital Course:   ARF -Improved with IVF. -Cr down to 1.17 on DC from 2.50 on admission. -Lisinopril/HCTZ remains on hold.  UTI, Enterococcal -Completed treatment while in the hospital. -No further abx upon DC.  Acute recurrent DVT -Started on Eliquis. -Is currently at 10 mg BID and will need to transition to 5 mg BID in 14 days.  Hypotension -2/2 severe dehydration and hypovolemia.  Pernicious Anemia/B12 deficiency -Continue B12 supplemention IM weekly x 4 weeks then monthly for the rest of his life.  Generalized Weakness -Due to UTI/B12 deficiency on top pf baseline weakness. -Will go to SNF for rehab.   Procedures:  None   Consultations:  None  Discharge Instructions  Discharge Instructions    Diet - low sodium heart healthy    Complete by:  As directed      Increase activity slowly    Complete by:  As directed             Medication List    STOP taking these medications  ciprofloxacin 500 MG tablet  Commonly known as:  CIPRO     lisinopril-hydrochlorothiazide 10-12.5 MG per tablet  Commonly known as:  PRINZIDE,ZESTORETIC     nitrofurantoin (macrocrystal-monohydrate) 100 MG capsule  Commonly known as:  MACROBID     Vitamin D3 3000 UNITS Tabs      TAKE these medications        apixaban 5 MG Tabs tablet  Commonly known as:  ELIQUIS  Take 2 tablets (10 mg total) by mouth 2 (two) times daily.     clonazePAM 0.5 MG tablet  Commonly  known as:  KLONOPIN  Take 1 tablet (0.5 mg total) by mouth daily.     cyanocobalamin 1000 MCG/ML injection  Commonly known as:  (VITAMIN B-12)  Inject 1 mL (1,000 mcg total) into the muscle once a week.     folic acid 1 MG tablet  Commonly known as:  FOLVITE  Take 1 tablet (1 mg total) by mouth daily.     gabapentin 100 MG capsule  Commonly known as:  NEURONTIN  Take 1 capsule (100 mg total) by mouth 3 (three) times daily.     HYDROcodone-acetaminophen 5-325 MG per tablet  Commonly known as:  NORCO/VICODIN  Take 1 tablet by mouth every 6 (six) hours as needed for moderate pain.     multivitamin-prenatal 27-0.8 MG Tabs tablet  Take 1 tablet by mouth daily at 12 noon.     pantoprazole 40 MG tablet  Commonly known as:  PROTONIX  Take 1 tablet (40 mg total) by mouth daily.     tamsulosin 0.4 MG Caps capsule  Commonly known as:  FLOMAX  Take 1 capsule (0.4 mg total) by mouth daily.       No Known Allergies    The results of significant diagnostics from this hospitalization (including imaging, microbiology, ancillary and laboratory) are listed below for reference.    Significant Diagnostic Studies: Dg Chest 1 View  07/19/2015   CLINICAL DATA:  Shortness of breath.  EXAM: CHEST 1 VIEW  COMPARISON:  07/06/2015  FINDINGS: Cardiomediastinal silhouette is normal. Mediastinal contours appear intact.  There is no evidence of focal airspace consolidation, pleural effusion or pneumothorax. There are low lung volumes.  Osseous structures are without acute abnormality. Soft tissues are grossly normal.  IMPRESSION: Low lung volumes, otherwise no evidence of acute cardiopulmonary process.   Electronically Signed   By: Ted Mcalpine M.D.   On: 07/19/2015 11:58   Dg Abd 1 View  07/19/2015   CLINICAL DATA:  Lower abdominal pain and diarrhea for 3 days.  EXAM: ABDOMEN - 1 VIEW  COMPARISON:  CT of the abdomen pelvis dated 08/14/2014  FINDINGS: There is paucity of small bowel gas. The colon  is gas distended. No evidence of free gas on this supine radiograph. Several calcific density overlie the expected location of the gallbladder, likely representing gallstones. IVC filter is in place.  IMPRESSION: Diffuse gaseous distension of the colon, nonspecific finding.  Relative paucity of small bowel gas, no evidence of small-bowel obstruction.  Cholelithiasis.   Electronically Signed   By: Ted Mcalpine M.D.   On: 07/19/2015 12:02   US Renal  07/16/2015   CLINICAL DATA:  64 year old male with acute kidney injury  EXAM: RENAL / URINARY TRACT ULTRASOUND COMPLETE  COMPARISON:  None.  FINDINGS: Right Kidney:  Length: 11.6 cm. Normal echogenicity of the parenchyma. No evidence of hydronephrosis. Anechoic cyst in the upper pole measures 16 x 20 x 17 mm. There is a  solitary thin internal septation. Findings are most consistent with a minimally complex cyst.  Left Kidney:  Length: 10.8 cm. Parenchymal echogenicity is within normal limits. There is a mild hydronephrosis. No solid mass.  Bladder:  Decompressed with Foley catheter in place.  IMPRESSION: 1. Mild left hydronephrosis. 2. No evidence of right-sided hydronephrosis. 3. Minimally complex right renal cyst.   Electronically Signed   By: Malachy Moan M.D.   On: 07/16/2015 18:18   Nm Pulmonary Perf And Vent  07/19/2015   CLINICAL DATA:  Shortness of breath for the past few days.  EXAM: NUCLEAR MEDICINE VENTILATION - PERFUSION LUNG SCAN  TECHNIQUE: Ventilation images were obtained in multiple projections using inhaled aerosol Tc-54m DTPA. Perfusion images were obtained in multiple projections after intravenous injection of Tc-5m MAA.  RADIOPHARMACEUTICALS:  44 Technetium-39m DTPA aerosol inhalation and 6.4 Technetium-69m MAA IV  COMPARISON:  None.  FINDINGS: Ventilation: No focal ventilation defect.  Perfusion: No wedge shaped peripheral perfusion defects to suggest acute pulmonary embolism.  IMPRESSION: Low probability of pulmonary embolus.    Electronically Signed   By: Ted Mcalpine M.D.   On: 07/19/2015 11:57   US Venous Img Lower Bilateral  07/17/2015   ADDENDUM REPORT: 07/17/2015 19:20 ADDENDUM: Critical Value/emergent results were called by telephone at the time of interpretation on 07/17/2015 at 7:20 pm to Dr. Sharl Ma, who verbally acknowledged these results. Electronically Signed   By: Lupita Raider, M.D.   On: 07/17/2015 19:20  07/17/2015   CLINICAL DATA:  Bilateral lower extremity edema.  EXAM: BILATERAL LOWER EXTREMITY VENOUS DOPPLER ULTRASOUND  TECHNIQUE: Gray-scale sonography with graded compression, as well as color Doppler and duplex ultrasound were performed to evaluate the lower extremity deep venous systems from the level of the common femoral vein and including the common femoral, femoral, profunda femoral, popliteal and calf veins including the posterior tibial, peroneal and gastrocnemius veins when visible. The superficial great saphenous vein was also interrogated. Spectral Doppler was utilized to evaluate flow at rest and with distal augmentation maneuvers in the common femoral, femoral and popliteal veins.  COMPARISON:  None.  FINDINGS: RIGHT LOWER EXTREMITY  Common Femoral Vein: No evidence of thrombus. Normal compressibility, respiratory phasicity and response to augmentation.  Saphenofemoral Junction: No evidence of thrombus. Normal compressibility and flow on color Doppler imaging.  Profunda Femoral Vein: No evidence of thrombus. Normal compressibility and flow on color Doppler imaging.  Femoral Vein: Noncompressible with occlusive thrombus present.  Popliteal Vein: Noncompressible with occlusive thrombus present.  Calf Veins: Not well visualized.  Superficial Great Saphenous Vein: No evidence of thrombus. Normal compressibility and flow on color Doppler imaging.  Venous Reflux:  None.  Other Findings:  None.  LEFT LOWER EXTREMITY  Common Femoral Vein: Noncompressible due to nonocclusive thrombus.  Saphenofemoral  Junction: No evidence of thrombus. Normal compressibility and flow on color Doppler imaging.  Profunda Femoral Vein: No evidence of thrombus. Normal compressibility and flow on color Doppler imaging.  Femoral Vein: Noncompressible secondary to occlusive thrombus.  Popliteal Vein: Noncompressible secondary to occlusive thrombus.  Calf Veins: Not well visualized.  Superficial Great Saphenous Vein: No evidence of thrombus. Normal compressibility and flow on color Doppler imaging.  Venous Reflux:  None.  Other Findings:  None.  IMPRESSION: Findings consistent with acute deep venous thrombosis involving the superficial femoral and popliteal veins bilaterally. These results will be called to the ordering clinician or representative by the Radiologist Assistant, and communication documented in the PACS or zVision Dashboard.  Electronically Signed: By:  Lupita Raider, M.D. On: 07/17/2015 19:16   Dg Chest Portable 1 View  07/16/2015   CLINICAL DATA:  Fall several days ago.  EXAM: PORTABLE CHEST - 1 VIEW  COMPARISON:  10/05/2014  FINDINGS: The heart size appears enlarged. There is no pleural or pericardial effusion identified. Both lungs are clear. The visualized skeletal structures are unremarkable.  IMPRESSION: No active disease.   Electronically Signed   By: Signa Kell M.D.   On: 07/16/2015 14:02    Microbiology: Recent Results (from the past 240 hour(s))  Blood culture (routine x 2)     Status: None (Preliminary result)   Collection Time: 07/16/15  2:10 PM  Result Value Ref Range Status   Specimen Description BLOOD LEFT ANTECUBITAL  Final   Special Requests BOTTLES DRAWN AEROBIC AND ANAEROBIC 6CC EACH  Final   Culture NO GROWTH 4 DAYS  Final   Report Status PENDING  Incomplete  Blood culture (routine x 2)     Status: None (Preliminary result)   Collection Time: 07/16/15  2:17 PM  Result Value Ref Range Status   Specimen Description BLOOD RIGHT ANTECUBITAL  Final   Special Requests BOTTLES DRAWN  AEROBIC ONLY 4CC  Final   Culture NO GROWTH 4 DAYS  Final   Report Status PENDING  Incomplete  Urine culture     Status: None   Collection Time: 07/16/15  2:30 PM  Result Value Ref Range Status   Specimen Description URINE, CATHETERIZED  Final   Special Requests NONE  Final   Culture   Final    >=100,000 COLONIES/mL ENTEROCOCCUS SPECIES Performed at [facility]    Report Status 07/19/2015 FINAL  Final   Organism ID, Bacteria ENTEROCOCCUS SPECIES  Final      Susceptibility   Enterococcus species - MIC*    AMPICILLIN <=2 SENSITIVE Sensitive     LEVOFLOXACIN >=8 RESISTANT Resistant     NITROFURANTOIN 32 SENSITIVE Sensitive     VANCOMYCIN 2 SENSITIVE Sensitive     * >=100,000 COLONIES/mL ENTEROCOCCUS SPECIES  MRSA PCR Screening     Status: None   Collection Time: 07/16/15  3:45 PM  Result Value Ref Range Status   MRSA by PCR NEGATIVE NEGATIVE Final    Comment:        The GeneXpert MRSA Assay (FDA approved for NASAL specimens only), is one component of a comprehensive MRSA colonization surveillance program. It is not intended to diagnose MRSA infection nor to guide or monitor treatment for MRSA infections.      Labs: Basic Metabolic Panel:  Recent Labs Lab 07/16/15 1255 07/17/15 0456 07/18/15 0410 07/19/15 0522 07/20/15 0620  NA 133* 137 138 140 139  K 3.2* 3.3* 3.8 4.3 3.7  CL 99* 105 110 111 111  CO2 22 23 22 22 22   GLUCOSE 123* 104* 144* 110* 103*  BUN 46* 41* 29* 19 16  CREATININE 8.62* 5.67* 2.50* 1.47* 1.17  CALCIUM 8.4* 7.8* 8.2* 8.6* 8.7*  PHOS  --  4.0 2.4* 3.0  --    Liver Function Tests:  Recent Labs Lab 07/16/15 1255 07/17/15 0456 07/18/15 0410 07/19/15 0522  AST 34  34  --   --   --   ALT 25  25  --   --   --   ALKPHOS 67  66  --   --   --   BILITOT 0.8  0.8  --   --   --   PROT 7.7  7.6  --   --   --  ALBUMIN 3.4*  3.3* 2.7* 2.8* 2.8*   No results for input(s): LIPASE, AMYLASE in the last 168 hours. No results for  input(s): AMMONIA in the last 168 hours. CBC:  Recent Labs Lab 07/16/15 1255 07/17/15 0456 07/18/15 0410 07/19/15 0522 07/20/15 0620  WBC 11.5* 7.6 7.2 7.0 6.5  NEUTROABS 9.2*  --   --   --   --   HGB 10.4* 9.5* 10.1* 9.9* 10.1*  HCT 31.7* 29.3* 31.6* 31.2* 30.7*  MCV 88.3 90.2 89.5 89.9 89.8  PLT 303 247 328 390 388   Cardiac Enzymes: No results for input(s): CKTOTAL, CKMB, CKMBINDEX, TROPONINI in the last 168 hours. BNP: BNP (last 3 results) No results for input(s): BNP in the last 8760 hours.  ProBNP (last 3 results)  Recent Labs  08/13/14 2224  PROBNP 82.8    CBG: No results for input(s): GLUCAP in the last 168 hours.     SignedChaya Jan  [facility] Pager: [phone] 07/20/2015, 10:32 AM

## 2015-07-20 NOTE — Progress Notes (Signed)
Patient discharged to [facility] called,and given to [facility] LPN. Vital signs stable. Awaiting for transport to awaiting facility via EMS.

## 2015-07-20 NOTE — Progress Notes (Signed)
Subjective: He offers no complaint and feeling much better  Objective: Vital signs in last 24 hours: Temp:  [98.5 F (36.9 C)-99.3 F (37.4 C)] 98.6 F (37 C) (09/24 0603) Pulse Rate:  [87-91] 87 (09/24 0603) Resp:  [19-20] 19 (09/24 0603) BP: (142-164)/(65-75) 142/65 mmHg (09/24 0603) SpO2:  [97 %-98 %] 98 % (09/24 0603) Weight:  [205 lb 9.6 oz (93.26 kg)] 205 lb 9.6 oz (93.26 kg) (09/24 0603)  Intake/Output from previous day: 09/23 0701 - 09/24 0700 In: 1121.7 [P.O.:120; I.V.:951.7; IV Piggyback:50] Out: 1500 [Urine:1500] Intake/Output this shift: Total I/O In: 240 [P.O.:240] Out: -    Recent Labs  07/18/15 0410 07/19/15 0522 07/20/15 0620  HGB 10.1* 9.9* 10.1*    Recent Labs  07/19/15 0522 07/20/15 0620  WBC 7.0 6.5  RBC 3.47* 3.42*  HCT 31.2* 30.7*  PLT 390 388    Recent Labs  07/19/15 0522 07/20/15 0620  NA 140 139  K 4.3 3.7  CL 111 111  CO2 22 22  BUN 19 16  CREATININE 1.47* 1.17  GLUCOSE 110* 103*  CALCIUM 8.6* 8.7*    Recent Labs  07/17/15 1943  INR 1.22    Generally patient is alert and in no apparent distress Chest is clear to auscultation Heart exam reveals regular rate and rhythm no murmur or S3 Abdomen: Soft positive bowel sounds  extremities no edema  Assessment/Plan: Problem #1 acute kidney injury: Possibly a combination of prerenal syndrome/ATN/ACE inhibitor. Presently his renal function is improving and has turned to his base line. Patient with 1500 cc of urine out put Problem #2 hypokalemia: Patient is on potassium supplement presently his potassium is normal Problem #3 hyponatremia: His sodium has corrected Problem #4 anemia: Taught to be secondary to iron deficiency anemia. His hemoglobin is stable Problem #5 bilateral DVT: Patient on heparin Problem #6 history of mild left hydronephrosis. Problem #7 UTI: Patient is on antibiotics. He is a febrile and his white blood cell count is normal. Plan: 1] We'll d/c ivf 2] D/C  [facility] cather 3] Since his renal function has improved I will sign off. Thank you for letting participate in his care   [facility] S 07/20/2015, 9:48 AM

## 2015-07-21 ENCOUNTER — Non-Acute Institutional Stay (SKILLED_NURSING_FACILITY): Payer: Medicare Other | Admitting: Internal Medicine

## 2015-07-21 DIAGNOSIS — R260 Ataxic gait: Secondary | ICD-10-CM

## 2015-07-21 DIAGNOSIS — E538 Deficiency of other specified B group vitamins: Secondary | ICD-10-CM

## 2015-07-21 DIAGNOSIS — N200 Calculus of kidney: Secondary | ICD-10-CM | POA: Diagnosis not present

## 2015-07-21 DIAGNOSIS — N178 Other acute kidney failure: Secondary | ICD-10-CM

## 2015-07-21 DIAGNOSIS — N400 Enlarged prostate without lower urinary tract symptoms: Secondary | ICD-10-CM

## 2015-07-21 DIAGNOSIS — I82403 Acute embolism and thrombosis of unspecified deep veins of lower extremity, bilateral: Secondary | ICD-10-CM | POA: Diagnosis not present

## 2015-07-21 DIAGNOSIS — E559 Vitamin D deficiency, unspecified: Secondary | ICD-10-CM | POA: Diagnosis not present

## 2015-07-21 DIAGNOSIS — G320 Subacute combined degeneration of spinal cord in diseases classified elsewhere: Secondary | ICD-10-CM

## 2015-07-21 LAB — CULTURE, BLOOD (ROUTINE X 2)
CULTURE: NO GROWTH
Culture: NO GROWTH

## 2015-07-21 NOTE — Progress Notes (Signed)
Patient ID: Edwin [name], male   DOB: 1951/02/16, 64 y.o.   MRN: [id number]     Facility; [facility] Chief complaint; admission to SNF post admit to [facility] from 9/20 to 07/20/15  History; this is a patient we had in the building the late part of 2015 to the yearly part of 2016. At that point he had found to have a DVT. He was anticoagulated with Eliquis but developed hematuria therefore had to have an IVC filter placed. He was found to have nephrolithiasis and followed by urology. Also noted to have extreme gait ataxia with really profound vitamin B12 deficiency. While he was here he improved markedly and was actually discharged with a rolling walker. He was also found to have profound vitamin D deficiency. My overall impression was that this man had subacute combined degeneration of the cord perhaps cognitive issues related to B12 both of which seem to have a remarkable turnaround while he was here. He was also followed by urology with very significant nephrolithiasis and a bladder stone in the setting of BPH.  He was admitted this time with generalized weakness. He was found to be in acute renal failure with a creatinine of 2.5. He was also found to have an extensive bilateral leg DVT and he was restarted on Eliquis. It would appear that he was felt also to have need a weekly vitamin B12 4 then monthly. I wonder whether he was getting his monthly injections as an outpatient. His creatinine corrected with IV fluid fluid  CBC Latest Ref Rng 07/20/2015 07/19/2015 07/18/2015  WBC 4.0 - 10.5 K/uL 6.5 7.0 7.2  Hemoglobin 13.0 - 17.0 g/dL 10.1(L) 9.9(L) 10.1(L)  Hematocrit 39.0 - 52.0 % 30.7(L) 31.2(L) 31.6(L)  Platelets 150 - 400 K/uL 388 390 328    BMP Latest Ref Rng 07/20/2015 07/19/2015 07/18/2015  Glucose 65 - 99 mg/dL 073(X) 106(Y) 694(W)  BUN 6 - 20 mg/dL 16 19 54(O)  Creatinine 0.61 - 1.24 mg/dL 2.70 3.50(K) 9.38(H)  BUN/Creat Ratio 10 - 22 - - -  Sodium 135 - 145 mmol/L 139 140 138    Potassium 3.5 - 5.1 mmol/L 3.7 4.3 3.8  Chloride 101 - 111 mmol/L 111 111 110  CO2 22 - 32 mmol/L Calcium 8.9 - 10.3 mg/dL 8.2(X) 9.3(Z) 1.6(R)   Past Medical History  Diagnosis Date  . Encephalopathy   . UTI (lower urinary tract infection)   . Renal disorder     Bladder stone,Bladder diverticulm  . Hx of sepsis 12/15  . Peripheral vascular disease 10/11/14    acute embolism and thrombosis Rt femoral vein  . Dysphagia 10/11/14  . Blood dyscrasia     pancytopenia  . Anemia     nutritional anemia  . Anemia     posthemorrhagic anemia  . Neuromuscular disorder     muscle weakness  . Failure to thrive in adult 08/22/14  . S/P IVC filter 10/05/2014    Sec to hematuria on Eliquis  . Right leg DVT 08/14/2014    R. femoral vein  . Pancytopenia 08/14/2014  . Megaloblastic anemia due to B12 deficiency 08/15/2014  . Bladder calculus 10/06/2014  . Essential hypertension 01/31/2015  . BPH (benign prostatic hyperplasia) 01/31/2015  . Sepsis 10/05/2014  . Acute encephalopathy 08/14/2014    Past Surgical History  Procedure Laterality Date  . Cystoscopy N/A 08/21/2014    Procedure: CYSTOSCOPY FLEXIBLE;  Surgeon: Chelsea Aus, MD;  Location: [facility];  Service: Urology;  Laterality: N/A;  at bedside  . Cystoscopy with litholapaxy N/A 11/22/2014    Procedure: CYSTOSCOPY WITH LITHOLAPAXY;  Surgeon: Chelsea Aus, MD;  Location: [facility];  Service: Urology;  Laterality: N/A;  . Transurethral incision of prostate N/A 11/22/2014    Procedure: TRANSURETHRAL INCISION OF THE PROSTATE (TUIP);  Surgeon: Chelsea Aus, MD;  Location: [facility];  Service: Urology;  Laterality: N/A;    Current Outpatient Prescriptions on File Prior to Visit  Medication Sig Dispense Refill  . apixaban (ELIQUIS) 5 MG TABS tablet Take 2 tablets (10 mg total) by mouth 2 (two) times daily. 60 tablet   . clonazePAM (KLONOPIN) 0.5 MG tablet Take 1 tablet (0.5 mg total) by mouth daily. 30 tablet 3  .  cyanocobalamin (,VITAMIN B-12,) 1000 MCG/ML injection Inject 1 mL (1,000 mcg total) into the muscle once a week. 1 mL 0  . folic acid (FOLVITE) 1 MG tablet Take 1 tablet (1 mg total) by mouth daily. 30 tablet 3  . gabapentin (NEURONTIN) 100 MG capsule Take 1 capsule (100 mg total) by mouth 3 (three) times daily. 90 capsule 6  . HYDROcodone-acetaminophen (NORCO/VICODIN) 5-325 MG per tablet Take 1 tablet by mouth every 6 (six) hours as needed for moderate pain. 120 tablet 0  . pantoprazole (PROTONIX) 40 MG tablet Take 1 tablet (40 mg total) by mouth daily. 30 tablet 6  . Prenatal Vit-Fe Fumarate-FA (MULTIVITAMIN-PRENATAL) 27-0.8 MG TABS tablet Take 1 tablet by mouth daily at 12 noon. 30 tablet 6  . tamsulosin (FLOMAX) 0.4 MG CAPS capsule Take 1 capsule (0.4 mg total) by mouth daily. 30 capsule 6   Social the patient tells me he was living in his own apartment in [place]. He claims to be fairly independent but I'm not sure how accurate this is. He was using a Rollator walker  reports that he has been smoking Cigars.  He has never used smokeless tobacco. He reports that he does not drink alcohol or use illicit drugs.  Fam hx indicated that his mother is deceased. He indicated that his father is deceased. He indicated that his sister is deceased.   Review of systems Gen. denies weight loss HEENT no visual disturbances no diplopia Respiratory; no cough no sputum. States he is not currently smoking Cardiac no exertional chest pain GI no dysphagia. No nausea vomiting or change in bowel habits GU denies hematuria or dysuria Skin; denies any worrisome lesions Endocrine states he is not a diabetic Musculoskeletal denies any joint pain or back pain or neck pain Neurologic; no complaints of lateralizing weakness however he states his walking is unsteady Mental status no complaints of depressive symptoms   Physical examination; Vitals; O2 sat is 97% on room air respirations 18 and  unlabored pulse rate 75 Gen.; the patient does not appear to be in any distress HEENT; no oral lesions were seen. Poor dentition Neck thyroid not palpable Lymph none palpable in the submandibular, cervical, supraclavicular or axilla re-areas Respiratory; clear air entry bilaterally, no wheezing,  Cardiac; S1-S2 normal, no gallops, no murmurs, jugular venous pressure is not elevated. He appears to be euvolemic Abdomen; no liver no spleen, no tenderness, no masses. No stigmatic of chronic liver disease Extremities; peripheral pulp pulses are palpably normal. There is extensive edema of the left leg up to the groin area. Minimal edema of the right leg to just above the ankle. Neurologic; cranial nerves within normal limits, motor strength and tone normal, reflexes are diffusely hyporeflexic Gait; he  is able to bring himself to a sitting position. Wide-based and very unsteady to stand Mental status; no evidence of cognitive loss or depression  Impression/plan #1 acute renal failure which improved with IV fluid. #2 bilateral DVTs involving the femoral and popliteal veins now back on Eliquis. I am assuming he still has the filter in place I'll need to verify this. I would wonder about an underlying malignancy in this man. #3 megaloblastic anemia secondary to B12 deficiency. I note that he follows with oncology and has been getting what appears to be vitamin B-12 shots as an outpatient. I'm not really certain why it was felt that he needed weekly vitamin B12. His vitamin B12 level was 232 and then rechecked at 631. Folate levels were normal #4 normochromic normocytic anemia on this admission with a hemoglobin of 10.1. He had iron studies which were suggestive of iron deficiency with a serum iron of 10 TIBC of 216% saturation at 5 and a ferritin level at 239. I had some thoughts about a mixed deficiency however his RDW is within the normal range #5 significant gait ataxia however this was without his  walker. A lot worse than I remember him being when he left here in early March. #6 history of nephrolithiasis with a large bladder stone that does not appear to be an issue currently. #7 history of vitamin D deficiency although I don't see that he is currently on vitamin D. His last vitamin D level I see was in April 2016.  The major issue here would seem to be his extreme gait ataxia. I would like to know what sort of social  and functional level he had prior to admission. He claims that he was very functional I would like this verified with collateral history.

## 2015-07-22 ENCOUNTER — Other Ambulatory Visit: Payer: Self-pay | Admitting: *Deleted

## 2015-07-22 MED ORDER — CLONAZEPAM 0.5 MG PO TABS: ORAL_TABLET | ORAL | Status: DC

## 2015-07-22 NOTE — Telephone Encounter (Signed)
[facility] 

## 2015-07-31 ENCOUNTER — Non-Acute Institutional Stay (SKILLED_NURSING_FACILITY): Payer: Medicare Other | Admitting: Internal Medicine

## 2015-07-31 DIAGNOSIS — G320 Subacute combined degeneration of spinal cord in diseases classified elsewhere: Secondary | ICD-10-CM | POA: Diagnosis not present

## 2015-07-31 DIAGNOSIS — R260 Ataxic gait: Secondary | ICD-10-CM | POA: Diagnosis not present

## 2015-07-31 DIAGNOSIS — E538 Deficiency of other specified B group vitamins: Secondary | ICD-10-CM | POA: Diagnosis not present

## 2015-07-31 DIAGNOSIS — I82403 Acute embolism and thrombosis of unspecified deep veins of lower extremity, bilateral: Secondary | ICD-10-CM | POA: Diagnosis not present

## 2015-07-31 DIAGNOSIS — E559 Vitamin D deficiency, unspecified: Secondary | ICD-10-CM | POA: Diagnosis not present

## 2015-07-31 DIAGNOSIS — N178 Other acute kidney failure: Secondary | ICD-10-CM

## 2015-08-05 NOTE — Progress Notes (Addendum)
Patient ID: Edwin [name], male   DOB: 14-Aug-1951, 64 y.o.   MRN: [id number]                PROGRESS NOTE  DATE:  07/31/2015       FACILITY: [facility]                LEVEL OF CARE:   SNF   Routine Visit           CHIEF COMPLAINT:  Follow up medical issues.      HISTORY OF PRESENT ILLNESS:  I admitted this man to the building on 07/21/2015.  He had been admitted from 07/16/2015 through 07/20/2015 to [facility].    He was found to have acute renal failure with a creatinine of 2.5.    He was also found to have extensive bilateral leg DVTs and was restarted on Eliquis.     He was also given weekly vitamin B12 injections even though his vitamin B12 levels were within range and he was on vitamin B12 injections through the hematologist.    The patient was in the building in the late part of 2015.  At that point, he also had a DVT and was anticoagulated with Eliquis but developed hematuria.  He had to have an IVC filter placed.  The hematuria was secondary to nephrolithiasis and he was followed by Urology.  That does not seem to be an issue now.    CURRENT MEDICATIONS:  Medication list is reviewed.    LABORATORY DATA:   Follow-up lab work from 07/30/2015:     White count 8, hemoglobin 11.1, platelet count 448 (the hemoglobin has increased).  His indices are normochromic, normocytic.    His electrolytes are within normal range.  BUN 12, creatinine 0.93, calcium 9.2.    25-hydroxy vitamin D level 29.9.     Past Medical History  Diagnosis Date  . Encephalopathy   . UTI (lower urinary tract infection)   . Renal disorder     Bladder stone,Bladder diverticulm  . Hx of sepsis 12/15  . Peripheral vascular disease 10/11/14    acute embolism and thrombosis Rt femoral vein  . Dysphagia 10/11/14  . Blood dyscrasia     pancytopenia  . Anemia     nutritional anemia  . Anemia     posthemorrhagic anemia  . Neuromuscular disorder     muscle weakness  . Failure to thrive in adult  08/22/14  . S/P IVC filter 10/05/2014    Sec to hematuria on Eliquis  . Right leg DVT 08/14/2014    R. femoral vein  . Pancytopenia 08/14/2014  . Megaloblastic anemia due to B12 deficiency 08/15/2014  . Bladder calculus 10/06/2014  . Essential hypertension 01/31/2015  . BPH (benign prostatic hyperplasia) 01/31/2015  . Sepsis 10/05/2014  . Acute encephalopathy 08/14/2014   Past Surgical History  Procedure Laterality Date  . Cystoscopy N/A 08/21/2014    Procedure: CYSTOSCOPY FLEXIBLE;  Surgeon: Chelsea Aus, MD;  Location: [facility];  Service: Urology;  Laterality: N/A;  at bedside  . Cystoscopy with litholapaxy N/A 11/22/2014    Procedure: CYSTOSCOPY WITH LITHOLAPAXY;  Surgeon: Chelsea Aus, MD;  Location: [facility];  Service: Urology;  Laterality: N/A;  . Transurethral incision of prostate N/A 11/22/2014    Procedure: TRANSURETHRAL INCISION OF THE PROSTATE (TUIP);  Surgeon: Chelsea Aus, MD;  Location: [facility];  Service: Urology;  Laterality: N/A;    REVIEW OF SYSTEMS:    GENERAL:  The  patient states he feels well.   He is not in any pain.   CHEST/RESPIRATORY:  No cough.  No sputum.    CARDIAC:  No chest pain.  No palpitations.     GI:  No nausea or vomiting.  No dysphagia.   He states he is having normal bowel movements.  I do have a nurse's note from 07/24/2015 suggesting abdominal pain and constipation.   He does not seem to remember this.   GU:  He denies dysuria or hematuria.   EXTREMITIES: no pain in his legs.  NEUROLOGICAL:  Says he is up walking in therapy.    PHYSICAL EXAMINATION:   GENERAL APPEARANCE:  The patient is awake, conversational.   Appears stable.   HEENT: no icterus CHEST/RESPIRATORY:  Clear air entry bilaterally.    CARDIOVASCULAR:   CARDIAC:  Heart sounds are normal.  His JVP is not elevated.  He appears to be euvolemic.   There is no coccyx edema.       GASTROINTESTINAL:   ABDOMEN:  No masses.  No tenderness.     LIVER/SPLEEN/KIDNEYS:  No liver,  no spleen.  He has no ascites.  No stigmata of chronic liver disease. GU: bladder not distended or tender. No CVA tenderness   CIRCULATION:   EDEMA/VARICOSITIES:  Extremities:  He still has massive edema of the left leg up into the groin.  Lesser degrees of edema of the right leg.  The edema is pitting.   Some tenderness in the left leg, especially on the posterior left thigh.   NEUROLOGICAL:    BALANCE/GAIT:  I did not retest this today.  He was very unsteady when I did his admission last week.     ASSESSMENT/PLAN:                 Acute renal failure.   This improved with IV fluid in the hospital and has not been a repeat issue.    Bilateral DVTs, with a history of a DVT and presumably a filter in place.  He is on Eliquis.  I am not completely certain why the edema in the left leg has not come down.    I reviewed a CT scan of the abdomen from 08/14/2014.  This showed cholelithiasis without additional evidence of cholecystitis.  He had enlarged lymph nodes next to a chronically inflamed-looking bladder.  This was worked up by Urology, however.    Vitamin B12 deficiency.   His hemoglobin seems to be increasing.     Vitamin D deficiency.   He has a history of this.  Apparently is not on vitamin D currently, which I will correct.    Normochromic, normocytic anemia.  Again, his hemoglobin seems to be responding, up to 11.1.    I am going to start him on vitamin D3.     I am not certain why the left leg is not responding to anticoagulants.  He does, I believe, have an IVC filter which may be contributing.  The thought of external IVC compression certainly would come to mind, as well.  I will have another look at the leg next week.     CPT CODE: 16109

## 2015-08-13 ENCOUNTER — Ambulatory Visit (HOSPITAL_COMMUNITY): Payer: Medicare Other

## 2015-08-21 ENCOUNTER — Ambulatory Visit: Payer: Medicare Other | Admitting: Family Medicine

## 2015-08-21 ENCOUNTER — Other Ambulatory Visit: Payer: Self-pay | Admitting: *Deleted

## 2015-08-21 ENCOUNTER — Encounter: Payer: Self-pay | Admitting: Family Medicine

## 2015-08-21 ENCOUNTER — Ambulatory Visit (INDEPENDENT_AMBULATORY_CARE_PROVIDER_SITE_OTHER): Payer: Medicare Other | Admitting: Family Medicine

## 2015-08-21 VITALS — BP 178/94 | HR 92 | Temp 97.7°F | Ht 67.0 in | Wt 205.0 lb

## 2015-08-21 DIAGNOSIS — I10 Essential (primary) hypertension: Secondary | ICD-10-CM | POA: Diagnosis not present

## 2015-08-21 DIAGNOSIS — E46 Unspecified protein-calorie malnutrition: Secondary | ICD-10-CM | POA: Diagnosis not present

## 2015-08-21 DIAGNOSIS — E559 Vitamin D deficiency, unspecified: Secondary | ICD-10-CM | POA: Diagnosis not present

## 2015-08-21 DIAGNOSIS — Z87440 Personal history of urinary (tract) infections: Secondary | ICD-10-CM | POA: Diagnosis not present

## 2015-08-21 DIAGNOSIS — N400 Enlarged prostate without lower urinary tract symptoms: Secondary | ICD-10-CM | POA: Diagnosis not present

## 2015-08-21 DIAGNOSIS — F419 Anxiety disorder, unspecified: Secondary | ICD-10-CM | POA: Diagnosis not present

## 2015-08-21 DIAGNOSIS — I739 Peripheral vascular disease, unspecified: Secondary | ICD-10-CM | POA: Diagnosis not present

## 2015-08-21 DIAGNOSIS — R627 Adult failure to thrive: Secondary | ICD-10-CM | POA: Diagnosis not present

## 2015-08-21 DIAGNOSIS — D509 Iron deficiency anemia, unspecified: Secondary | ICD-10-CM | POA: Diagnosis not present

## 2015-08-21 DIAGNOSIS — Z7901 Long term (current) use of anticoagulants: Secondary | ICD-10-CM | POA: Diagnosis not present

## 2015-08-21 DIAGNOSIS — R260 Ataxic gait: Secondary | ICD-10-CM | POA: Diagnosis not present

## 2015-08-21 DIAGNOSIS — D531 Other megaloblastic anemias, not elsewhere classified: Secondary | ICD-10-CM | POA: Diagnosis not present

## 2015-08-21 DIAGNOSIS — G709 Myoneural disorder, unspecified: Secondary | ICD-10-CM | POA: Diagnosis not present

## 2015-08-21 DIAGNOSIS — Z9181 History of falling: Secondary | ICD-10-CM | POA: Diagnosis not present

## 2015-08-21 DIAGNOSIS — N259 Disorder resulting from impaired renal tubular function, unspecified: Secondary | ICD-10-CM | POA: Diagnosis not present

## 2015-08-21 DIAGNOSIS — I82403 Acute embolism and thrombosis of unspecified deep veins of lower extremity, bilateral: Secondary | ICD-10-CM | POA: Diagnosis not present

## 2015-08-21 DIAGNOSIS — R1310 Dysphagia, unspecified: Secondary | ICD-10-CM | POA: Diagnosis not present

## 2015-08-21 DIAGNOSIS — G959 Disease of spinal cord, unspecified: Secondary | ICD-10-CM | POA: Diagnosis not present

## 2015-08-21 MED ORDER — LISINOPRIL-HYDROCHLOROTHIAZIDE 10-12.5 MG PO TABS: 1.0000 | ORAL_TABLET | Freq: Every day | ORAL | Status: DC

## 2015-08-21 NOTE — Progress Notes (Signed)
Subjective:    Patient ID: Edwin [name], male    DOB: 06/11/1951, 64 y.o.   MRN: [id number]  HPI 64 year old gentleman who is here to follow-up as he was [facility]. He is doing well he has a cane but says he does not needed. His B12 level was low and that was treated he still has some numbness in his left leg but the leg is edematous secondary to old DVT. He denies pain. Blood pressure is elevated today. Apparently was taken off blood pressure medicine at one time; he had been on ACE inhibitor and diuretic. He has had several DVTs. He has an IVC filter but will probably be on anticoagulant indefinitely.  Patient Active Problem List   Diagnosis Date Noted  . DVT, bilateral lower limbs (HCC) 07/18/2015  . Renal failure 07/16/2015  . Acute kidney injury (HCC) 07/16/2015  . Generalized weakness 07/16/2015  . Volume depletion 07/16/2015  . Hypotension 07/16/2015  . Hypokalemia 07/16/2015  . Bilateral lower extremity edema 07/16/2015  . UTI (urinary tract infection) 07/16/2015  . Normocytic anemia 07/16/2015  . Screening for lipid disorders 01/31/2015  . Essential hypertension 01/31/2015  . Neuropathic pain 01/31/2015  . BPH (benign prostatic hyperplasia) 01/31/2015  . Generalized anxiety disorder 01/31/2015  . Vitamin D deficiency 01/31/2015  . Calculus in bladder 11/22/2014  . Bladder calculus 10/06/2014  . Fever 10/05/2014  . Sepsis (HCC) 10/05/2014  . S/P IVC filter 10/05/2014  . Encephalopathy   . Malnutrition of moderate degree (HCC) 08/17/2014  . Hematuria 08/17/2014  . Megaloblastic anemia due to B12 deficiency 08/15/2014  . Vitamin D deficiency 08/15/2014  . Acute encephalopathy 08/14/2014  . Adult failure to thrive 08/14/2014  . UTI (lower urinary tract infection) 08/14/2014  . Pancytopenia (HCC) 08/14/2014  . Severe protein-calorie malnutrition (HCC) 08/14/2014  . Vitamin B12 deficiency 08/14/2014  . Right leg  DVT (HCC) 08/14/2014   Outpatient Encounter Prescriptions as of 08/21/2015  Medication Sig  . apixaban (ELIQUIS) 5 MG TABS tablet Take 2 tablets (10 mg total) by mouth 2 (two) times daily.  . clonazePAM (KLONOPIN) 0.5 MG tablet Take one tablet by mouth once daily  . cyanocobalamin (,VITAMIN B-12,) 1000 MCG/ML injection Inject 1 mL (1,000 mcg total) into the muscle once a week.  . folic acid (FOLVITE) 1 MG tablet Take 1 tablet (1 mg total) by mouth daily.  Marland Kitchen. gabapentin (NEURONTIN) 100 MG capsule Take 1 capsule (100 mg total) by mouth 3 (three) times daily.  Marland Kitchen. HYDROcodone-acetaminophen (NORCO/VICODIN) 5-325 MG per tablet Take 1 tablet by mouth every 6 (six) hours as needed for moderate pain.  . pantoprazole (PROTONIX) 40 MG tablet Take 1 tablet (40 mg total) by mouth daily.  . Prenatal Vit-Fe Fumarate-FA (MULTIVITAMIN-PRENATAL) 27-0.8 MG TABS tablet Take 1 tablet by mouth daily at 12 noon.  . tamsulosin (FLOMAX) 0.4 MG CAPS capsule Take 1 capsule (0.4 mg total) by mouth daily.   No facility-administered encounter medications on file as of 08/21/2015.      Review of Systems  Constitutional: Negative.   HENT: Negative.   Respiratory: Negative.   Cardiovascular: Positive for leg swelling.  Genitourinary: Negative.   Neurological: Positive for numbness.  Psychiatric/Behavioral: Negative.        Objective:   Physical Exam  Constitutional: He is oriented to person, place, and time. He appears well-developed and well-nourished.  Cardiovascular: Normal rate and regular rhythm.   Pulmonary/Chest: Effort normal and breath sounds normal.  Musculoskeletal: He exhibits edema.  Edema involves left leg  Neurological: He is alert and oriented to person, place, and time.  Psychiatric: He has a normal mood and affect. Thought content normal.          Assessment & Plan:  1. Vitamin D deficiency Patient had ask if he needs to continue vitamin D and what level will need to check vitamin D  level to answer that question - Vit D  25 hydroxy (rtn osteoporosis monitoring)  Frederica Kuster MD  Notice: Thisdictation was prepared with Dragon dictation along with smaller phrase technology. Any transcriptional errors that result from this process are unintentional and may not be corrected upon review

## 2015-08-22 ENCOUNTER — Encounter (HOSPITAL_COMMUNITY): Payer: Self-pay

## 2015-08-22 LAB — VITAMIN D 25 HYDROXY (VIT D DEFICIENCY, FRACTURES): Vit D, 25-Hydroxy: 31.8 ng/mL (ref 30.0–100.0)

## 2015-08-23 DIAGNOSIS — R260 Ataxic gait: Secondary | ICD-10-CM | POA: Diagnosis not present

## 2015-08-23 DIAGNOSIS — I10 Essential (primary) hypertension: Secondary | ICD-10-CM | POA: Diagnosis not present

## 2015-08-23 DIAGNOSIS — R627 Adult failure to thrive: Secondary | ICD-10-CM | POA: Diagnosis not present

## 2015-08-23 DIAGNOSIS — I82403 Acute embolism and thrombosis of unspecified deep veins of lower extremity, bilateral: Secondary | ICD-10-CM | POA: Diagnosis not present

## 2015-08-23 DIAGNOSIS — E46 Unspecified protein-calorie malnutrition: Secondary | ICD-10-CM | POA: Diagnosis not present

## 2015-08-23 DIAGNOSIS — D531 Other megaloblastic anemias, not elsewhere classified: Secondary | ICD-10-CM | POA: Diagnosis not present

## 2015-08-28 DIAGNOSIS — I82403 Acute embolism and thrombosis of unspecified deep veins of lower extremity, bilateral: Secondary | ICD-10-CM | POA: Diagnosis not present

## 2015-08-28 DIAGNOSIS — R260 Ataxic gait: Secondary | ICD-10-CM | POA: Diagnosis not present

## 2015-08-28 DIAGNOSIS — D531 Other megaloblastic anemias, not elsewhere classified: Secondary | ICD-10-CM | POA: Diagnosis not present

## 2015-08-28 DIAGNOSIS — E46 Unspecified protein-calorie malnutrition: Secondary | ICD-10-CM | POA: Diagnosis not present

## 2015-08-28 DIAGNOSIS — R627 Adult failure to thrive: Secondary | ICD-10-CM | POA: Diagnosis not present

## 2015-08-28 DIAGNOSIS — I10 Essential (primary) hypertension: Secondary | ICD-10-CM | POA: Diagnosis not present

## 2015-08-29 ENCOUNTER — Other Ambulatory Visit: Payer: Self-pay | Admitting: Internal Medicine

## 2015-08-29 ENCOUNTER — Telehealth: Payer: Self-pay | Admitting: *Deleted

## 2015-08-29 NOTE — Telephone Encounter (Signed)
[facility] aware of order and repeat lab

## 2015-08-29 NOTE — Telephone Encounter (Signed)
Covering for PCP.   Orders for B12 by home health, once weekly for  4 weeks then recheck level and likely change to once monthly.   Murtis SinkSam Makynleigh Breslin, MD [facility] 08/29/2015, 3:16 PM

## 2015-08-29 NOTE — Telephone Encounter (Signed)
Home health is seeing patient now, they can give her B12 shot. The orders were put in by nursing home doctor, for injection weekly. Shouldn't this be changed after a month? [facility] patient

## 2015-08-30 ENCOUNTER — Telehealth: Payer: Self-pay | Admitting: Family Medicine

## 2015-08-30 DIAGNOSIS — R627 Adult failure to thrive: Secondary | ICD-10-CM | POA: Diagnosis not present

## 2015-08-30 DIAGNOSIS — R260 Ataxic gait: Secondary | ICD-10-CM | POA: Diagnosis not present

## 2015-08-30 DIAGNOSIS — E46 Unspecified protein-calorie malnutrition: Secondary | ICD-10-CM | POA: Diagnosis not present

## 2015-08-30 DIAGNOSIS — D531 Other megaloblastic anemias, not elsewhere classified: Secondary | ICD-10-CM | POA: Diagnosis not present

## 2015-08-30 DIAGNOSIS — I10 Essential (primary) hypertension: Secondary | ICD-10-CM | POA: Diagnosis not present

## 2015-08-30 DIAGNOSIS — I82403 Acute embolism and thrombosis of unspecified deep veins of lower extremity, bilateral: Secondary | ICD-10-CM | POA: Diagnosis not present

## 2015-08-30 NOTE — Telephone Encounter (Signed)
[facility] patient

## 2015-08-30 NOTE — Telephone Encounter (Signed)
Please call patient back and have him come in to see a provider today because of the questions he has about a clot in his leg.

## 2015-09-02 ENCOUNTER — Ambulatory Visit: Payer: Medicare Other | Admitting: Family Medicine

## 2015-09-02 DIAGNOSIS — E46 Unspecified protein-calorie malnutrition: Secondary | ICD-10-CM | POA: Diagnosis not present

## 2015-09-02 DIAGNOSIS — R260 Ataxic gait: Secondary | ICD-10-CM | POA: Diagnosis not present

## 2015-09-02 DIAGNOSIS — I82403 Acute embolism and thrombosis of unspecified deep veins of lower extremity, bilateral: Secondary | ICD-10-CM | POA: Diagnosis not present

## 2015-09-02 DIAGNOSIS — I10 Essential (primary) hypertension: Secondary | ICD-10-CM | POA: Diagnosis not present

## 2015-09-02 DIAGNOSIS — R627 Adult failure to thrive: Secondary | ICD-10-CM | POA: Diagnosis not present

## 2015-09-02 DIAGNOSIS — D531 Other megaloblastic anemias, not elsewhere classified: Secondary | ICD-10-CM | POA: Diagnosis not present

## 2015-09-02 NOTE — Telephone Encounter (Signed)
appt 09/03/15 at 9:30, soon as he could get a ride.

## 2015-09-03 ENCOUNTER — Telehealth (HOSPITAL_COMMUNITY): Payer: Self-pay | Admitting: Lab

## 2015-09-03 ENCOUNTER — Ambulatory Visit (INDEPENDENT_AMBULATORY_CARE_PROVIDER_SITE_OTHER): Payer: Medicare Other | Admitting: Family Medicine

## 2015-09-03 ENCOUNTER — Encounter (HOSPITAL_COMMUNITY): Payer: Self-pay | Admitting: Lab

## 2015-09-03 ENCOUNTER — Encounter: Payer: Self-pay | Admitting: Family Medicine

## 2015-09-03 VITALS — BP 149/85 | HR 108 | Temp 97.0°F | Ht 67.0 in | Wt 202.4 lb

## 2015-09-03 DIAGNOSIS — I82402 Acute embolism and thrombosis of unspecified deep veins of left lower extremity: Secondary | ICD-10-CM

## 2015-09-03 DIAGNOSIS — R8299 Other abnormal findings in urine: Secondary | ICD-10-CM | POA: Diagnosis not present

## 2015-09-03 DIAGNOSIS — M7989 Other specified soft tissue disorders: Secondary | ICD-10-CM

## 2015-09-03 DIAGNOSIS — D531 Other megaloblastic anemias, not elsewhere classified: Secondary | ICD-10-CM

## 2015-09-03 LAB — POCT URINALYSIS DIPSTICK
Glucose, UA: NEGATIVE
Nitrite, UA: NEGATIVE
PH UA: 6
Spec Grav, UA: >=1.03
UROBILINOGEN UA: NEGATIVE

## 2015-09-03 LAB — POCT UA - MICROSCOPIC ONLY
CASTS, UR, LPF, POC: NEGATIVE
Crystals, Ur, HPF, POC: NEGATIVE
Mucus, UA: NEGATIVE
YEAST UA: NEGATIVE

## 2015-09-03 MED ORDER — APIXABAN 5 MG PO TABS: 5.0000 mg | ORAL_TABLET | Freq: Two times a day (BID) | ORAL | Status: DC

## 2015-09-03 MED ORDER — CYANOCOBALAMIN 1000 MCG/ML IJ SOLN: 1000.0000 ug | INTRAMUSCULAR | Status: DC

## 2015-09-03 MED ORDER — FUROSEMIDE 20 MG PO TABS: 20.0000 mg | ORAL_TABLET | Freq: Every day | ORAL | Status: DC

## 2015-09-03 NOTE — Progress Notes (Signed)
Subjective:    Patient ID: Edwin [name], male    DOB: May 22, 1951, 64 y.o.   MRN: [id number]  HPI  Patient here today with his son to follow up on DVT from October. Patient states his left leg is still swollen. This patient is new to me. He was recently seen by Dr. Sabra Heck. He has been hospitalized with a discharge to the nursing home and he is now home. He comes to the visit today with his son. He received a B12 injection last week by the home health nurse. He is running out of his eliquis. He denies any other symptoms as far as chest pain or shortness of breath or problems with his GI tract or passing his water. When he was in the hospital he was dehydrated and septic from a urinary tract infection. He also was in acute renal failure. Since he is been discharged from the nursing home and he is back at home he is doing much better. He says that the leg that the blood clot was and is getting smaller all the time although it is still swollen. It is important to note that the patient has an inferior vena cava filter. The son was with the patient the whole time and seemed to be very supportive of his father. He says that he checks on him daily.       Patient Active Problem List   Diagnosis Date Noted  . DVT, bilateral lower limbs ([place]) 07/18/2015  . Renal failure 07/16/2015  . Acute kidney injury ([place]) 07/16/2015  . Generalized weakness 07/16/2015  . Volume depletion 07/16/2015  . Hypotension 07/16/2015  . Hypokalemia 07/16/2015  . Bilateral lower extremity edema 07/16/2015  . UTI (urinary tract infection) 07/16/2015  . Normocytic anemia 07/16/2015  . Screening for lipid disorders 01/31/2015  . Essential hypertension 01/31/2015  . Neuropathic pain 01/31/2015  . BPH (benign prostatic hyperplasia) 01/31/2015  . Generalized anxiety disorder 01/31/2015  . Vitamin D deficiency 01/31/2015  . Calculus in bladder 11/22/2014  . Bladder calculus 10/06/2014  . Fever 10/05/2014  . Sepsis ([place])  10/05/2014  . S/P IVC filter 10/05/2014  . Encephalopathy   . Malnutrition of moderate degree ([place]) 08/17/2014  . Hematuria 08/17/2014  . Megaloblastic anemia due to B12 deficiency 08/15/2014  . Vitamin D deficiency 08/15/2014  . Acute encephalopathy 08/14/2014  . Adult failure to thrive 08/14/2014  . UTI (lower urinary tract infection) 08/14/2014  . Pancytopenia ([place]) 08/14/2014  . Severe protein-calorie malnutrition ([place]) 08/14/2014  . Vitamin B12 deficiency 08/14/2014  . Right leg DVT ([place]) 08/14/2014   Outpatient Encounter Prescriptions as of 09/03/2015  Medication Sig  . apixaban (ELIQUIS) 5 MG TABS tablet Take 2 tablets (10 mg total) by mouth 2 (two) times daily.  . Cholecalciferol (VITAMIN D3) 2000 UNITS TABS Take 1 tablet by mouth daily.  . clonazePAM (KLONOPIN) 0.5 MG tablet Take one tablet by mouth once daily  . cyanocobalamin (,VITAMIN B-12,) 1000 MCG/ML injection Inject 1 mL (1,000 mcg total) into the muscle once a week.  . folic acid (FOLVITE) 1 MG tablet Take 1 tablet (1 mg total) by mouth daily.  Marland Kitchen gabapentin (NEURONTIN) 100 MG capsule Take 1 capsule (100 mg total) by mouth 3 (three) times daily.  Marland Kitchen HYDROcodone-acetaminophen (NORCO/VICODIN) 5-325 MG per tablet Take 1 tablet by mouth every 6 (six) hours as needed for moderate pain.  Marland Kitchen lisinopril-hydrochlorothiazide (PRINZIDE,ZESTORETIC) 10-12.5 MG tablet Take 1 tablet by mouth daily.  . Multiple Vitamin (MULTIVITAMIN WITH MINERALS) TABS  tablet Take 1 tablet by mouth daily.  . pantoprazole (PROTONIX) 40 MG tablet Take 1 tablet (40 mg total) by mouth daily.  . tamsulosin (FLOMAX) 0.4 MG CAPS capsule Take 1 capsule (0.4 mg total) by mouth daily.  . Prenatal Vit-Fe Fumarate-FA (MULTIVITAMIN-PRENATAL) 27-0.8 MG TABS tablet Take 1 tablet by mouth daily at 12 noon.   No facility-administered encounter medications on file as of 09/03/2015.     Review of Systems  Constitutional: Negative.   HENT: Negative.   Eyes: Negative.     Respiratory: Negative.   Cardiovascular: Positive for leg swelling.       Left leg swelling due to prior DVT  Gastrointestinal: Negative.   Endocrine: Negative.   Genitourinary: Negative.   Musculoskeletal: Negative.   Skin: Negative.   Allergic/Immunologic: Negative.   Neurological: Negative.   Hematological: Negative.   Psychiatric/Behavioral: Negative.            Objective:   Physical Exam  Constitutional: He is oriented to person, place, and time. He appears well-developed and well-nourished. No distress.  HENT:  Head: Normocephalic and atraumatic.  Right Ear: External ear normal.  Left Ear: External ear normal.  Nose: Nose normal.  Mouth/Throat: Oropharynx is clear and moist. No oropharyngeal exudate.  Eyes: Conjunctivae and EOM are normal. Pupils are equal, round, and reactive to light. Right eye exhibits no discharge. Left eye exhibits no discharge. No scleral icterus.  Neck: Normal range of motion. Neck supple. No thyromegaly present.  Cardiovascular: Normal rate, regular rhythm, normal heart sounds and intact distal pulses.  Exam reveals no gallop and no friction rub.   No murmur heard. The heart had a regular rate and rhythm at 96/m  Pulmonary/Chest: Effort normal and breath sounds normal. No respiratory distress. He has no wheezes. He has no rales.  Clear anteriorly and posteriorly  Abdominal: Soft. Bowel sounds are normal. He exhibits no mass. There is no tenderness. There is no rebound and no guarding.  Musculoskeletal: Normal range of motion. He exhibits edema. He exhibits no tenderness.  The patient has bilateral edema with the left being greater than the right. It was pitting at 2+ on the left and 1+ on the right. There was also some rubor and warmth and slight redness on the left.  Lymphadenopathy:    He has no cervical adenopathy.  Neurological: He is alert and oriented to person, place, and time. No cranial nerve deficit.  Skin: Skin is warm and dry. No  rash noted. There is erythema. No pallor.  Slight redness and warmth to the left leg with pitting edema  Psychiatric: He has a normal mood and affect. His behavior is normal. Judgment and thought content normal.  Nursing note and vitals reviewed.    BP 149/85 mmHg  Pulse 108  Temp(Src) 97 F (36.1 C) (Oral)  Ht _0  (1.702 m)  Wt 202 lb 6.4 oz (91.808 kg)  BMI 31.69 kg/m2        Assessment & Plan:  1. Left leg swelling -The patient will take 20 mg of Lasix daily for 1 week and then discontinue this. -He will continue to drink plenty of fluids and stay well hydrated. - BMP8+EGFR - Thyroid Panel With TSH - CBC with Differential/Platelet  2. Megaloblastic anemia due to B12 deficiency -He received a B12 injection last week by the home health nurse. He will return to this office in about 3 weeks and get a B12 level with another B12 injection. At that point time he  will start getting his B12 injections once monthly. - cyanocobalamin (,VITAMIN B-12,) 1000 MCG/ML injection; Inject 1 mL (1,000 mcg total) into the muscle once a week.  Dispense: 1 mL; Refill: 11  3. Deep vein thrombosis (DVT) of left lower extremity, unspecified chronicity, unspecified vein (HCC) -Continue the blood thinner -Return to clinic in 3 weeks as planned to recheck the degree of swelling in the leg and get a B12 injection at that time  Meds ordered this encounter  Medications  . Multiple Vitamin (MULTIVITAMIN WITH MINERALS) TABS tablet    Sig: Take 1 tablet by mouth daily.  . furosemide (LASIX) 20 MG tablet    Sig: Take 1 tablet (20 mg total) by mouth daily. As directed    Dispense:  30 tablet    Refill:  0  . apixaban (ELIQUIS) 5 MG TABS tablet    Sig: Take 1 tablet (5 mg total) by mouth 2 (two) times daily.    Dispense:  60 tablet    Refill:  6  . cyanocobalamin (,VITAMIN B-12,) 1000 MCG/ML injection    Sig: Inject 1 mL (1,000 mcg total) into the muscle once a week.    Dispense:  1 mL    Refill:  11     Patient Instructions  Take Lasix 20 mg one a day for 1 weeks Follow up with our office in 3 weeks - you will get a B12 shot and B12 level this day  We will call you about the labs that are drawn today.  Continue to take the Eliquis 5 mg twice daily    Arrie Senate MD

## 2015-09-03 NOTE — Addendum Note (Signed)
Addended by: Tommas OlpHANDY, Camyla Camposano N on: 09/03/2015 05:58 PM   Modules accepted: Orders

## 2015-09-03 NOTE — Patient Instructions (Addendum)
Take Lasix 20 mg one a day for 1 weeks Follow up with our office in 3 weeks - you will get a B12 shot and B12 level this day  We will call you about the labs that are drawn today.  Continue to take the Eliquis 5 mg twice daily

## 2015-09-04 DIAGNOSIS — R627 Adult failure to thrive: Secondary | ICD-10-CM | POA: Diagnosis not present

## 2015-09-04 DIAGNOSIS — I10 Essential (primary) hypertension: Secondary | ICD-10-CM | POA: Diagnosis not present

## 2015-09-04 DIAGNOSIS — R260 Ataxic gait: Secondary | ICD-10-CM | POA: Diagnosis not present

## 2015-09-04 DIAGNOSIS — E46 Unspecified protein-calorie malnutrition: Secondary | ICD-10-CM | POA: Diagnosis not present

## 2015-09-04 DIAGNOSIS — I82403 Acute embolism and thrombosis of unspecified deep veins of lower extremity, bilateral: Secondary | ICD-10-CM | POA: Diagnosis not present

## 2015-09-04 DIAGNOSIS — D531 Other megaloblastic anemias, not elsewhere classified: Secondary | ICD-10-CM | POA: Diagnosis not present

## 2015-09-04 LAB — BMP8+EGFR
BUN / CREAT RATIO: 19 (ref 10–22)
BUN: 23 mg/dL (ref 8–27)
CHLORIDE: 101 mmol/L (ref 97–106)
CO2: 26 mmol/L (ref 18–29)
CREATININE: 1.21 mg/dL (ref 0.76–1.27)
Calcium: 10.9 mg/dL — ABNORMAL HIGH (ref 8.6–10.2)
GFR calc Af Amer: 72 mL/min/{1.73_m2} (ref >59)
GFR calc non Af Amer: 62 mL/min/{1.73_m2} (ref >59)
GLUCOSE: 117 mg/dL — AB (ref 65–99)
POTASSIUM: 4.5 mmol/L (ref 3.5–5.2)
SODIUM: 143 mmol/L (ref 136–144)

## 2015-09-04 LAB — CBC WITH DIFFERENTIAL/PLATELET
BASOS ABS: 0.1 10*3/uL (ref 0.0–0.2)
Basos: 1 %
EOS (ABSOLUTE): 0.4 10*3/uL (ref 0.0–0.4)
Eos: 4 %
Hematocrit: 42.3 % (ref 37.5–51.0)
Hemoglobin: 13.8 g/dL (ref 12.6–17.7)
IMMATURE GRANS (ABS): 0 10*3/uL (ref 0.0–0.1)
IMMATURE GRANULOCYTES: 0 %
LYMPHS: 18 %
Lymphocytes Absolute: 1.6 10*3/uL (ref 0.7–3.1)
MCH: 28.9 pg (ref 26.6–33.0)
MCHC: 32.6 g/dL (ref 31.5–35.7)
MCV: 89 fL (ref 79–97)
MONOS ABS: 0.4 10*3/uL (ref 0.1–0.9)
Monocytes: 4 %
NEUTROS ABS: 6.6 10*3/uL (ref 1.4–7.0)
Neutrophils: 73 %
PLATELETS: 316 10*3/uL (ref 150–379)
RBC: 4.77 x10E6/uL (ref 4.14–5.80)
RDW: 15.1 % (ref 12.3–15.4)
WBC: 9.1 10*3/uL (ref 3.4–10.8)

## 2015-09-04 LAB — THYROID PANEL WITH TSH
Free Thyroxine Index: 2 (ref 1.2–4.9)
T3 Uptake Ratio: 25 % (ref 24–39)
T4, Total: 8 ug/dL (ref 4.5–12.0)
TSH: 0.998 u[IU]/mL (ref 0.450–4.500)

## 2015-09-05 DIAGNOSIS — D531 Other megaloblastic anemias, not elsewhere classified: Secondary | ICD-10-CM | POA: Diagnosis not present

## 2015-09-05 DIAGNOSIS — R260 Ataxic gait: Secondary | ICD-10-CM | POA: Diagnosis not present

## 2015-09-05 DIAGNOSIS — I10 Essential (primary) hypertension: Secondary | ICD-10-CM | POA: Diagnosis not present

## 2015-09-05 DIAGNOSIS — R627 Adult failure to thrive: Secondary | ICD-10-CM | POA: Diagnosis not present

## 2015-09-05 DIAGNOSIS — E46 Unspecified protein-calorie malnutrition: Secondary | ICD-10-CM | POA: Diagnosis not present

## 2015-09-05 DIAGNOSIS — I82403 Acute embolism and thrombosis of unspecified deep veins of lower extremity, bilateral: Secondary | ICD-10-CM | POA: Diagnosis not present

## 2015-09-06 ENCOUNTER — Other Ambulatory Visit: Payer: Self-pay | Admitting: *Deleted

## 2015-09-06 LAB — URINE CULTURE

## 2015-09-06 MED ORDER — AMOXICILLIN 500 MG PO CAPS: 500.0000 mg | ORAL_CAPSULE | Freq: Four times a day (QID) | ORAL | Status: DC

## 2015-09-10 ENCOUNTER — Ambulatory Visit (HOSPITAL_COMMUNITY): Payer: Medicare Other

## 2015-09-11 DIAGNOSIS — I82403 Acute embolism and thrombosis of unspecified deep veins of lower extremity, bilateral: Secondary | ICD-10-CM | POA: Diagnosis not present

## 2015-09-11 DIAGNOSIS — D531 Other megaloblastic anemias, not elsewhere classified: Secondary | ICD-10-CM | POA: Diagnosis not present

## 2015-09-11 DIAGNOSIS — R627 Adult failure to thrive: Secondary | ICD-10-CM | POA: Diagnosis not present

## 2015-09-11 DIAGNOSIS — R260 Ataxic gait: Secondary | ICD-10-CM | POA: Diagnosis not present

## 2015-09-11 DIAGNOSIS — I10 Essential (primary) hypertension: Secondary | ICD-10-CM | POA: Diagnosis not present

## 2015-09-11 DIAGNOSIS — E46 Unspecified protein-calorie malnutrition: Secondary | ICD-10-CM | POA: Diagnosis not present

## 2015-09-12 DIAGNOSIS — I10 Essential (primary) hypertension: Secondary | ICD-10-CM | POA: Diagnosis not present

## 2015-09-12 DIAGNOSIS — I82403 Acute embolism and thrombosis of unspecified deep veins of lower extremity, bilateral: Secondary | ICD-10-CM | POA: Diagnosis not present

## 2015-09-12 DIAGNOSIS — E46 Unspecified protein-calorie malnutrition: Secondary | ICD-10-CM | POA: Diagnosis not present

## 2015-09-12 DIAGNOSIS — D531 Other megaloblastic anemias, not elsewhere classified: Secondary | ICD-10-CM | POA: Diagnosis not present

## 2015-09-12 DIAGNOSIS — R627 Adult failure to thrive: Secondary | ICD-10-CM | POA: Diagnosis not present

## 2015-09-12 DIAGNOSIS — R260 Ataxic gait: Secondary | ICD-10-CM | POA: Diagnosis not present

## 2015-09-13 ENCOUNTER — Encounter: Payer: Self-pay | Admitting: *Deleted

## 2015-09-14 DIAGNOSIS — I10 Essential (primary) hypertension: Secondary | ICD-10-CM | POA: Diagnosis not present

## 2015-09-14 DIAGNOSIS — E46 Unspecified protein-calorie malnutrition: Secondary | ICD-10-CM | POA: Diagnosis not present

## 2015-09-14 DIAGNOSIS — D531 Other megaloblastic anemias, not elsewhere classified: Secondary | ICD-10-CM | POA: Diagnosis not present

## 2015-09-14 DIAGNOSIS — R260 Ataxic gait: Secondary | ICD-10-CM | POA: Diagnosis not present

## 2015-09-14 DIAGNOSIS — R627 Adult failure to thrive: Secondary | ICD-10-CM | POA: Diagnosis not present

## 2015-09-14 DIAGNOSIS — I82403 Acute embolism and thrombosis of unspecified deep veins of lower extremity, bilateral: Secondary | ICD-10-CM | POA: Diagnosis not present

## 2015-09-16 DIAGNOSIS — E46 Unspecified protein-calorie malnutrition: Secondary | ICD-10-CM | POA: Diagnosis not present

## 2015-09-16 DIAGNOSIS — R627 Adult failure to thrive: Secondary | ICD-10-CM | POA: Diagnosis not present

## 2015-09-16 DIAGNOSIS — I10 Essential (primary) hypertension: Secondary | ICD-10-CM | POA: Diagnosis not present

## 2015-09-16 DIAGNOSIS — R260 Ataxic gait: Secondary | ICD-10-CM | POA: Diagnosis not present

## 2015-09-16 DIAGNOSIS — I82403 Acute embolism and thrombosis of unspecified deep veins of lower extremity, bilateral: Secondary | ICD-10-CM | POA: Diagnosis not present

## 2015-09-16 DIAGNOSIS — D531 Other megaloblastic anemias, not elsewhere classified: Secondary | ICD-10-CM | POA: Diagnosis not present

## 2015-09-17 ENCOUNTER — Other Ambulatory Visit: Payer: Self-pay | Admitting: Physician Assistant

## 2015-09-18 ENCOUNTER — Telehealth: Payer: Self-pay

## 2015-09-18 DIAGNOSIS — I82403 Acute embolism and thrombosis of unspecified deep veins of lower extremity, bilateral: Secondary | ICD-10-CM | POA: Diagnosis not present

## 2015-09-18 DIAGNOSIS — I10 Essential (primary) hypertension: Secondary | ICD-10-CM | POA: Diagnosis not present

## 2015-09-18 DIAGNOSIS — D531 Other megaloblastic anemias, not elsewhere classified: Secondary | ICD-10-CM | POA: Diagnosis not present

## 2015-09-18 DIAGNOSIS — R260 Ataxic gait: Secondary | ICD-10-CM | POA: Diagnosis not present

## 2015-09-18 DIAGNOSIS — E46 Unspecified protein-calorie malnutrition: Secondary | ICD-10-CM | POA: Diagnosis not present

## 2015-09-18 DIAGNOSIS — R627 Adult failure to thrive: Secondary | ICD-10-CM | POA: Diagnosis not present

## 2015-09-18 NOTE — Telephone Encounter (Signed)
Tried to draw B12 level today  Stuck twice cant get.  Patient has an appt with Dr Ermalinda MemosBradshaw here on 09/23/15  Can it wait?

## 2015-09-18 NOTE — Telephone Encounter (Signed)
Yes

## 2015-09-18 NOTE — Telephone Encounter (Signed)
Edwin [name], ok to wait per Dr. Darlyn ReadStacks

## 2015-09-23 ENCOUNTER — Ambulatory Visit: Payer: Self-pay | Admitting: Family Medicine

## 2015-09-23 ENCOUNTER — Other Ambulatory Visit: Payer: Self-pay | Admitting: Physician Assistant

## 2015-09-23 ENCOUNTER — Other Ambulatory Visit: Payer: Self-pay | Admitting: Family Medicine

## 2015-09-23 DIAGNOSIS — R260 Ataxic gait: Secondary | ICD-10-CM | POA: Diagnosis not present

## 2015-09-23 DIAGNOSIS — I10 Essential (primary) hypertension: Secondary | ICD-10-CM | POA: Diagnosis not present

## 2015-09-23 DIAGNOSIS — R627 Adult failure to thrive: Secondary | ICD-10-CM | POA: Diagnosis not present

## 2015-09-23 DIAGNOSIS — D531 Other megaloblastic anemias, not elsewhere classified: Secondary | ICD-10-CM | POA: Diagnosis not present

## 2015-09-23 DIAGNOSIS — E46 Unspecified protein-calorie malnutrition: Secondary | ICD-10-CM | POA: Diagnosis not present

## 2015-09-23 DIAGNOSIS — I82403 Acute embolism and thrombosis of unspecified deep veins of lower extremity, bilateral: Secondary | ICD-10-CM | POA: Diagnosis not present

## 2015-09-24 ENCOUNTER — Ambulatory Visit (INDEPENDENT_AMBULATORY_CARE_PROVIDER_SITE_OTHER): Payer: Medicare Other | Admitting: Family Medicine

## 2015-09-24 ENCOUNTER — Ambulatory Visit: Payer: Medicare Other | Admitting: Family Medicine

## 2015-09-24 ENCOUNTER — Encounter: Payer: Self-pay | Admitting: Family Medicine

## 2015-09-24 VITALS — BP 147/90 | HR 93 | Temp 97.7°F | Ht 67.0 in | Wt 199.8 lb

## 2015-09-24 DIAGNOSIS — R260 Ataxic gait: Secondary | ICD-10-CM | POA: Diagnosis not present

## 2015-09-24 DIAGNOSIS — R627 Adult failure to thrive: Secondary | ICD-10-CM | POA: Diagnosis not present

## 2015-09-24 DIAGNOSIS — R300 Dysuria: Secondary | ICD-10-CM | POA: Diagnosis not present

## 2015-09-24 DIAGNOSIS — I10 Essential (primary) hypertension: Secondary | ICD-10-CM | POA: Diagnosis not present

## 2015-09-24 DIAGNOSIS — D531 Other megaloblastic anemias, not elsewhere classified: Secondary | ICD-10-CM | POA: Diagnosis not present

## 2015-09-24 DIAGNOSIS — I82403 Acute embolism and thrombosis of unspecified deep veins of lower extremity, bilateral: Secondary | ICD-10-CM | POA: Diagnosis not present

## 2015-09-24 DIAGNOSIS — I878 Other specified disorders of veins: Secondary | ICD-10-CM | POA: Diagnosis not present

## 2015-09-24 DIAGNOSIS — E46 Unspecified protein-calorie malnutrition: Secondary | ICD-10-CM | POA: Diagnosis not present

## 2015-09-24 LAB — POCT URINALYSIS DIPSTICK
Bilirubin, UA: NEGATIVE
Glucose, UA: NEGATIVE
Ketones, UA: NEGATIVE
Leukocytes, UA: NEGATIVE
Nitrite, UA: NEGATIVE
Protein, UA: NEGATIVE
Spec Grav, UA: 1.015
Urobilinogen, UA: 1
pH, UA: 6.5

## 2015-09-24 LAB — POCT UA - MICROSCOPIC ONLY
Bacteria, U Microscopic: NEGATIVE
CASTS, UR, LPF, POC: NEGATIVE
Crystals, Ur, HPF, POC: NEGATIVE
WBC, UR, HPF, POC: NEGATIVE
Yeast, UA: NEGATIVE

## 2015-09-24 MED ORDER — LISINOPRIL-HYDROCHLOROTHIAZIDE 20-12.5 MG PO TABS: 1.0000 | ORAL_TABLET | Freq: Every day | ORAL | Status: DC

## 2015-09-24 NOTE — Telephone Encounter (Signed)
Last seen 09/03/15  DWM

## 2015-09-24 NOTE — Progress Notes (Signed)
HPI  Patient presents today here for follow-up hypertension, B-12 deficiency, urinary tract infection.  Hypertension States at home ranges from 120-150 over 70s, it's frequently over 140 He denies chest pain, dyspnea, palpitations He has good medication compliance  Leg swelling Seems to improve slightly with Lasix Denies any pain or redness of the leg. He had a previous DVT diagnosed in his left lower extremity in September of this year   B-12 injections He states he's been getting them weekly for several weeks, previous to that he was given B-12 injections in the nursing home.  PMH: Smoking status noted ROS: Per HPI  Objective: BP 147/90 mmHg  Pulse 93  Temp(Src) 97.7 F (36.5 C) (Oral)  Ht 5' 7" (1.702 m)  Wt 199 lb 12.8 oz (90.629 kg)  BMI 31.29 kg/m2 Gen: NAD, alert, cooperative with exam HEENT: NCAT CV: RRR, good S1/S2, no murmur Resp: CTABL, no wheezes, non-labored Abd: SNTND, BS present, no guarding or organomegaly, no CVA tenderness, no suprapubic tenderness  Ext: 1+ pitting edema on the left lower extremity, none on the right lower extremity Neuro: Alert and oriented, No gross deficits  Assessment and plan:  # Hypertension Slightly elevated, increase lisinopril from 10 mg to 20 mg Stop Lasix as discussed below. BMP in one month, he tolerated adding back Ace well after his previous AKI Follow-up in 2 months with PCP  # UTI Symptoms resolved, he finished all of his amoxicillin His urine sample today has no signs of infection He has physiologic microscopic hematuria with only 1-3 RBCs per high-power field  # B-12 deficiency, megaloblastic anemia Last CBC with hemoglobin which had changed quite a bit from 6 weeks prior to that MCV 89 I believe he's had adequate weekly injections to consider this a replacement of B-12, I will transition into maintenance dose of B-12 with monthly injections  # Leg swelling I suspect venous stasis secondary to venous  damage from his DVT Discontinue Lasix as I want to minimize the risk of acute kidney injury I recommended trial of TED hose, I have also written him a prescription for compression stockings if TED hose are inadequate. (20-30 mmHg)     Orders Placed This Encounter  Procedures  . BMP8+EGFR    Standing Status: Future     Number of Occurrences:      Standing Expiration Date: 09/23/2016    Order Specific Question:  Has the patient fasted?    Answer:  No  . POCT urinalysis dipstick  . POCT UA - Microscopic Only    Meds ordered this encounter  Medications  . lisinopril-hydrochlorothiazide (ZESTORETIC) 20-12.5 MG tablet    Sig: Take 1 tablet by mouth daily.    Dispense:  30 tablet    Refill:  5    Sam Bradshaw, MD [facility] 09/24/2015, 5:35 PM      

## 2015-09-24 NOTE — Patient Instructions (Addendum)
Great to meet you!  It is ok to go to B12 injections once a month   Lets see you again in 4-6 weeks to make sure your blood pressure is doing well, please bring the log.

## 2015-09-25 ENCOUNTER — Encounter (INDEPENDENT_AMBULATORY_CARE_PROVIDER_SITE_OTHER): Payer: Medicare Other | Admitting: Family Medicine

## 2015-09-25 DIAGNOSIS — R627 Adult failure to thrive: Secondary | ICD-10-CM | POA: Diagnosis not present

## 2015-09-25 DIAGNOSIS — R260 Ataxic gait: Secondary | ICD-10-CM | POA: Diagnosis not present

## 2015-09-25 DIAGNOSIS — I82403 Acute embolism and thrombosis of unspecified deep veins of lower extremity, bilateral: Secondary | ICD-10-CM

## 2015-09-25 DIAGNOSIS — D531 Other megaloblastic anemias, not elsewhere classified: Secondary | ICD-10-CM

## 2015-09-25 DIAGNOSIS — E46 Unspecified protein-calorie malnutrition: Secondary | ICD-10-CM | POA: Diagnosis not present

## 2015-09-25 DIAGNOSIS — I10 Essential (primary) hypertension: Secondary | ICD-10-CM

## 2015-09-29 DIAGNOSIS — D531 Other megaloblastic anemias, not elsewhere classified: Secondary | ICD-10-CM | POA: Diagnosis not present

## 2015-09-29 DIAGNOSIS — I10 Essential (primary) hypertension: Secondary | ICD-10-CM | POA: Diagnosis not present

## 2015-09-29 DIAGNOSIS — E46 Unspecified protein-calorie malnutrition: Secondary | ICD-10-CM | POA: Diagnosis not present

## 2015-09-29 DIAGNOSIS — I82403 Acute embolism and thrombosis of unspecified deep veins of lower extremity, bilateral: Secondary | ICD-10-CM | POA: Diagnosis not present

## 2015-09-29 DIAGNOSIS — R627 Adult failure to thrive: Secondary | ICD-10-CM | POA: Diagnosis not present

## 2015-09-29 DIAGNOSIS — R260 Ataxic gait: Secondary | ICD-10-CM | POA: Diagnosis not present

## 2015-09-30 DIAGNOSIS — D531 Other megaloblastic anemias, not elsewhere classified: Secondary | ICD-10-CM | POA: Diagnosis not present

## 2015-09-30 DIAGNOSIS — I82403 Acute embolism and thrombosis of unspecified deep veins of lower extremity, bilateral: Secondary | ICD-10-CM | POA: Diagnosis not present

## 2015-09-30 DIAGNOSIS — I10 Essential (primary) hypertension: Secondary | ICD-10-CM | POA: Diagnosis not present

## 2015-09-30 DIAGNOSIS — R260 Ataxic gait: Secondary | ICD-10-CM | POA: Diagnosis not present

## 2015-09-30 DIAGNOSIS — E46 Unspecified protein-calorie malnutrition: Secondary | ICD-10-CM | POA: Diagnosis not present

## 2015-09-30 DIAGNOSIS — R627 Adult failure to thrive: Secondary | ICD-10-CM | POA: Diagnosis not present

## 2015-10-03 DIAGNOSIS — I10 Essential (primary) hypertension: Secondary | ICD-10-CM | POA: Diagnosis not present

## 2015-10-03 DIAGNOSIS — R260 Ataxic gait: Secondary | ICD-10-CM | POA: Diagnosis not present

## 2015-10-03 DIAGNOSIS — I82403 Acute embolism and thrombosis of unspecified deep veins of lower extremity, bilateral: Secondary | ICD-10-CM | POA: Diagnosis not present

## 2015-10-03 DIAGNOSIS — E46 Unspecified protein-calorie malnutrition: Secondary | ICD-10-CM | POA: Diagnosis not present

## 2015-10-03 DIAGNOSIS — D531 Other megaloblastic anemias, not elsewhere classified: Secondary | ICD-10-CM | POA: Diagnosis not present

## 2015-10-03 DIAGNOSIS — R627 Adult failure to thrive: Secondary | ICD-10-CM | POA: Diagnosis not present

## 2015-10-08 ENCOUNTER — Ambulatory Visit (HOSPITAL_COMMUNITY): Payer: Medicare Other

## 2015-10-08 DIAGNOSIS — E46 Unspecified protein-calorie malnutrition: Secondary | ICD-10-CM | POA: Diagnosis not present

## 2015-10-08 DIAGNOSIS — I82403 Acute embolism and thrombosis of unspecified deep veins of lower extremity, bilateral: Secondary | ICD-10-CM | POA: Diagnosis not present

## 2015-10-08 DIAGNOSIS — R627 Adult failure to thrive: Secondary | ICD-10-CM | POA: Diagnosis not present

## 2015-10-08 DIAGNOSIS — D531 Other megaloblastic anemias, not elsewhere classified: Secondary | ICD-10-CM | POA: Diagnosis not present

## 2015-10-08 DIAGNOSIS — I10 Essential (primary) hypertension: Secondary | ICD-10-CM | POA: Diagnosis not present

## 2015-10-08 DIAGNOSIS — R260 Ataxic gait: Secondary | ICD-10-CM | POA: Diagnosis not present

## 2015-10-09 DIAGNOSIS — R260 Ataxic gait: Secondary | ICD-10-CM | POA: Diagnosis not present

## 2015-10-09 DIAGNOSIS — I82403 Acute embolism and thrombosis of unspecified deep veins of lower extremity, bilateral: Secondary | ICD-10-CM | POA: Diagnosis not present

## 2015-10-09 DIAGNOSIS — E46 Unspecified protein-calorie malnutrition: Secondary | ICD-10-CM | POA: Diagnosis not present

## 2015-10-09 DIAGNOSIS — D531 Other megaloblastic anemias, not elsewhere classified: Secondary | ICD-10-CM | POA: Diagnosis not present

## 2015-10-09 DIAGNOSIS — R627 Adult failure to thrive: Secondary | ICD-10-CM | POA: Diagnosis not present

## 2015-10-09 DIAGNOSIS — I10 Essential (primary) hypertension: Secondary | ICD-10-CM | POA: Diagnosis not present

## 2015-10-10 DIAGNOSIS — I82403 Acute embolism and thrombosis of unspecified deep veins of lower extremity, bilateral: Secondary | ICD-10-CM | POA: Diagnosis not present

## 2015-10-10 DIAGNOSIS — R260 Ataxic gait: Secondary | ICD-10-CM | POA: Diagnosis not present

## 2015-10-10 DIAGNOSIS — D531 Other megaloblastic anemias, not elsewhere classified: Secondary | ICD-10-CM | POA: Diagnosis not present

## 2015-10-10 DIAGNOSIS — I10 Essential (primary) hypertension: Secondary | ICD-10-CM | POA: Diagnosis not present

## 2015-10-10 DIAGNOSIS — R627 Adult failure to thrive: Secondary | ICD-10-CM | POA: Diagnosis not present

## 2015-10-10 DIAGNOSIS — E46 Unspecified protein-calorie malnutrition: Secondary | ICD-10-CM | POA: Diagnosis not present

## 2015-10-17 DIAGNOSIS — I10 Essential (primary) hypertension: Secondary | ICD-10-CM | POA: Diagnosis not present

## 2015-10-17 DIAGNOSIS — D531 Other megaloblastic anemias, not elsewhere classified: Secondary | ICD-10-CM | POA: Diagnosis not present

## 2015-10-17 DIAGNOSIS — E46 Unspecified protein-calorie malnutrition: Secondary | ICD-10-CM | POA: Diagnosis not present

## 2015-10-17 DIAGNOSIS — R627 Adult failure to thrive: Secondary | ICD-10-CM | POA: Diagnosis not present

## 2015-10-17 DIAGNOSIS — I82403 Acute embolism and thrombosis of unspecified deep veins of lower extremity, bilateral: Secondary | ICD-10-CM | POA: Diagnosis not present

## 2015-10-17 DIAGNOSIS — R260 Ataxic gait: Secondary | ICD-10-CM | POA: Diagnosis not present

## 2015-10-22 ENCOUNTER — Other Ambulatory Visit: Payer: Self-pay | Admitting: Family Medicine

## 2015-10-23 NOTE — Telephone Encounter (Signed)
Last seen 09/24/15 Dr Ermalinda MemosBradshaw

## 2015-11-12 ENCOUNTER — Ambulatory Visit (HOSPITAL_COMMUNITY): Payer: Medicare Other

## 2015-11-19 ENCOUNTER — Ambulatory Visit: Payer: Medicare Other | Admitting: Physician Assistant

## 2015-11-19 ENCOUNTER — Ambulatory Visit: Payer: Medicare Other | Admitting: Family Medicine

## 2015-11-28 ENCOUNTER — Ambulatory Visit (INDEPENDENT_AMBULATORY_CARE_PROVIDER_SITE_OTHER): Payer: Medicare HMO | Admitting: Family Medicine

## 2015-11-28 ENCOUNTER — Encounter: Payer: Self-pay | Admitting: Family Medicine

## 2015-11-28 VITALS — BP 186/105 | HR 95 | Temp 97.9°F | Ht 67.0 in | Wt 197.0 lb

## 2015-11-28 DIAGNOSIS — E538 Deficiency of other specified B group vitamins: Secondary | ICD-10-CM | POA: Diagnosis not present

## 2015-11-28 DIAGNOSIS — I10 Essential (primary) hypertension: Secondary | ICD-10-CM | POA: Diagnosis not present

## 2015-11-28 DIAGNOSIS — E559 Vitamin D deficiency, unspecified: Secondary | ICD-10-CM

## 2015-11-28 MED ORDER — CYANOCOBALAMIN 1000 MCG/ML IJ SOLN
1000.0000 ug | INTRAMUSCULAR | Status: AC
  Administered 2015-11-28 – 2015-12-27 (×2): 1000 ug via INTRAMUSCULAR

## 2015-11-28 NOTE — Progress Notes (Signed)
Subjective:    Patient ID: Edwin [name], male    DOB: [birth year], 65 y.o.   MRN: [id number]  HPI Pt here for follow up and management of chronic medical problems which includes hypertension. He is taking medications regularly.  65 year old gentleman here to follow-up hypertension, DVT, he is on chronic anticoagulant due to the second blood clot. Apparently also has an IVC filter. Blood pressure is elevated today but he has not had his pills for same. In reviewing his medicine list he is on Neurontin 100 mg 3 times a day. Reason for this is unclear. He denies any pain burning or tingling in his legs there is no history of diabetes or back problems. He does walk with the aid of a cane because his balance is off secondary to the blood clots in his legs.      Patient Active Problem List   Diagnosis Date Noted  . Venous stasis 09/24/2015  . DVT, bilateral lower limbs (HCC) 07/18/2015  . Renal failure 07/16/2015  . Acute kidney injury (HCC) 07/16/2015  . Generalized weakness 07/16/2015  . Volume depletion 07/16/2015  . Hypotension 07/16/2015  . Hypokalemia 07/16/2015  . Bilateral lower extremity edema 07/16/2015  . UTI (urinary tract infection) 07/16/2015  . Normocytic anemia 07/16/2015  . Screening for lipid disorders 01/31/2015  . Essential hypertension 01/31/2015  . Neuropathic pain 01/31/2015  . BPH (benign prostatic hyperplasia) 01/31/2015  . Generalized anxiety disorder 01/31/2015  . Vitamin D deficiency 01/31/2015  . Calculus in bladder 11/22/2014  . Bladder calculus 10/06/2014  . Fever 10/05/2014  . Sepsis (HCC) 10/05/2014  . S/P IVC filter 10/05/2014  . Encephalopathy   . Malnutrition of moderate degree (HCC) 08/17/2014  . Hematuria 08/17/2014  . Megaloblastic anemia due to B12 deficiency 08/15/2014  . Vitamin D deficiency 08/15/2014  . Acute encephalopathy 08/14/2014  . Adult failure to thrive 08/14/2014  . UTI (lower urinary tract infection) 08/14/2014  .  Pancytopenia (HCC) 08/14/2014  . Severe protein-calorie malnutrition (HCC) 08/14/2014  . Vitamin B12 deficiency 08/14/2014  . Right leg DVT (HCC) 08/14/2014   Outpatient Encounter Prescriptions as of 11/28/2015  Medication Sig  . apixaban (ELIQUIS) 5 MG TABS tablet Take 1 tablet (5 mg total) by mouth 2 (two) times daily.  . Cholecalciferol (VITAMIN D3) 2000 UNITS TABS Take 1 tablet by mouth daily.  . clonazePAM (KLONOPIN) 0.5 MG tablet Take one tablet by mouth once daily  . cyanocobalamin (,VITAMIN B-12,) 1000 MCG/ML injection Inject 1 mL (1,000 mcg total) into the muscle once a week.  . folic acid (FOLVITE) 1 MG tablet TAKE ONE TABLET BY MOUTH ONE TIME DAILY  . furosemide (LASIX) 20 MG tablet Take 1 tablet (20 mg total) by mouth daily. As directed  . gabapentin (NEURONTIN) 100 MG capsule Take 1 capsule (100 mg total) by mouth 3 (three) times daily.  Marland Kitchen HYDROcodone-acetaminophen (NORCO/VICODIN) 5-325 MG per tablet Take 1 tablet by mouth every 6 (six) hours as needed for moderate pain.  Marland Kitchen lisinopril-hydrochlorothiazide (ZESTORETIC) 20-12.5 MG tablet Take 1 tablet by mouth daily.  . pantoprazole (PROTONIX) 40 MG tablet Take 1 tablet (40 mg total) by mouth daily.  . Prenatal Vit-Fe Fumarate-FA (MULTIVITAMIN-PRENATAL) 27-0.8 MG TABS tablet Take 1 tablet by mouth daily at 12 noon.  . tamsulosin (FLOMAX) 0.4 MG CAPS capsule Take 1 capsule (0.4 mg total) by mouth daily.  . [DISCONTINUED] amoxicillin (AMOXIL) 500 MG capsule TAKE ONE CAPSULE BY MOUTH FOUR TIMES DAILY   No facility-administered encounter medications on file  as of 11/28/2015.     Review of Systems  Constitutional: Negative.   HENT: Negative.   Eyes: Negative.   Respiratory: Negative.   Cardiovascular: Negative.   Gastrointestinal: Negative.   Endocrine: Negative.   Genitourinary: Negative.   Musculoskeletal: Negative.   Skin: Negative.   Allergic/Immunologic: Negative.   Neurological: Negative.   Hematological: Negative.     Psychiatric/Behavioral: Negative.        Objective:   Physical Exam  Constitutional: He is oriented to person, place, and time. He appears well-developed and well-nourished.  Cardiovascular: Normal rate, regular rhythm and normal heart sounds.   Pulmonary/Chest: Effort normal and breath sounds normal.  Abdominal: Soft.  Musculoskeletal: Normal range of motion.  Neurological: He is alert and oriented to person, place, and time.  Psychiatric: He has a normal mood and affect.    BP 186/105 mmHg  Pulse 95  Temp(Src) 97.9 F (36.6 C) (Oral)  Ht  (1.702 m)  Wt 197 lb (89.359 kg)  BMI 30.85 kg/m2       Assessment & Plan:  1. Essential hypertension Pressure is elevated. I have asked him to monitor this at home. The son who accompanies him today will check for this. At his last visit 3 months ago blood pressure was normal. - Lipid panel  2. Vitamin D deficiency Patient does take maintenance dose of vitamin D  Frederica Kuster MD

## 2015-11-28 NOTE — Patient Instructions (Signed)
Medicare Annual Wellness Visit  [place] and the medical providers at [facility] strive to bring you the best medical care.  In doing so we not only want to address your current medical conditions and concerns but also to detect new conditions early and prevent illness, disease and health-related problems.    Medicare offers a yearly Wellness Visit which allows our clinical staff to assess your need for preventative services including immunizations, lifestyle education, counseling to decrease risk of preventable diseases and screening for fall risk and other medical concerns.    This visit is provided free of charge (no copay) for all Medicare recipients. The clinical pharmacists at [facility] have begun to conduct these Wellness Visits which will also include a thorough review of all your medications.    As you primary medical provider recommend that you make an appointment for your Annual Wellness Visit if you have not done so already this year.  You may set up this appointment before you leave today or you may call back ([phone]) and schedule an appointment.  Please make sure when you call that you mention that you are scheduling your Annual Wellness Visit with the clinical pharmacist so that the appointment may be made for the proper length of time.     Continue current medications. Continue good therapeutic lifestyle changes which include good diet and exercise. Fall precautions discussed with patient. If an FOBT was given today- please return it to our front desk. If you are over 50 years old - you may need Prevnar 13 or the adult Pneumonia vaccine.  **Flu shots are available--- please call and schedule a FLU-CLINIC appointment**  After your visit with us today you will receive a survey in the mail or online from [employer] regarding your care with us. Please take a moment to fill this out. Your feedback is very  important to us as you can help us better understand your patient needs as well as improve your experience and satisfaction. WE CARE ABOUT YOU!!!    

## 2015-11-28 NOTE — Addendum Note (Signed)
Addended by: Magdalene River on: 11/28/2015 12:02 PM   Modules accepted: Orders, SmartSet

## 2015-11-29 LAB — LIPID PANEL
CHOL/HDL RATIO: 7.2 ratio — AB (ref 0.0–5.0)
CHOLESTEROL TOTAL: 215 mg/dL — AB (ref 100–199)
HDL: 30 mg/dL — AB (ref >39)
LDL CALC: 141 mg/dL — AB (ref 0–99)
TRIGLYCERIDES: 218 mg/dL — AB (ref 0–149)
VLDL Cholesterol Cal: 44 mg/dL — ABNORMAL HIGH (ref 5–40)

## 2015-12-18 ENCOUNTER — Ambulatory Visit (HOSPITAL_COMMUNITY): Payer: Medicare Other

## 2015-12-18 ENCOUNTER — Other Ambulatory Visit (HOSPITAL_COMMUNITY): Payer: Medicare Other

## 2015-12-18 ENCOUNTER — Ambulatory Visit (HOSPITAL_COMMUNITY): Payer: Medicare Other | Admitting: Hematology & Oncology

## 2015-12-18 NOTE — Progress Notes (Signed)
This encounter was created in error - please disregard.

## 2015-12-22 IMAGING — DX DG CHEST 1V
1 series · 1 of 1 positions shown · non-contrast
Comparison: 07/06/2015

CLINICAL DATA: Shortness of breath.

EXAM:
CHEST 1 VIEW

[chest ap]
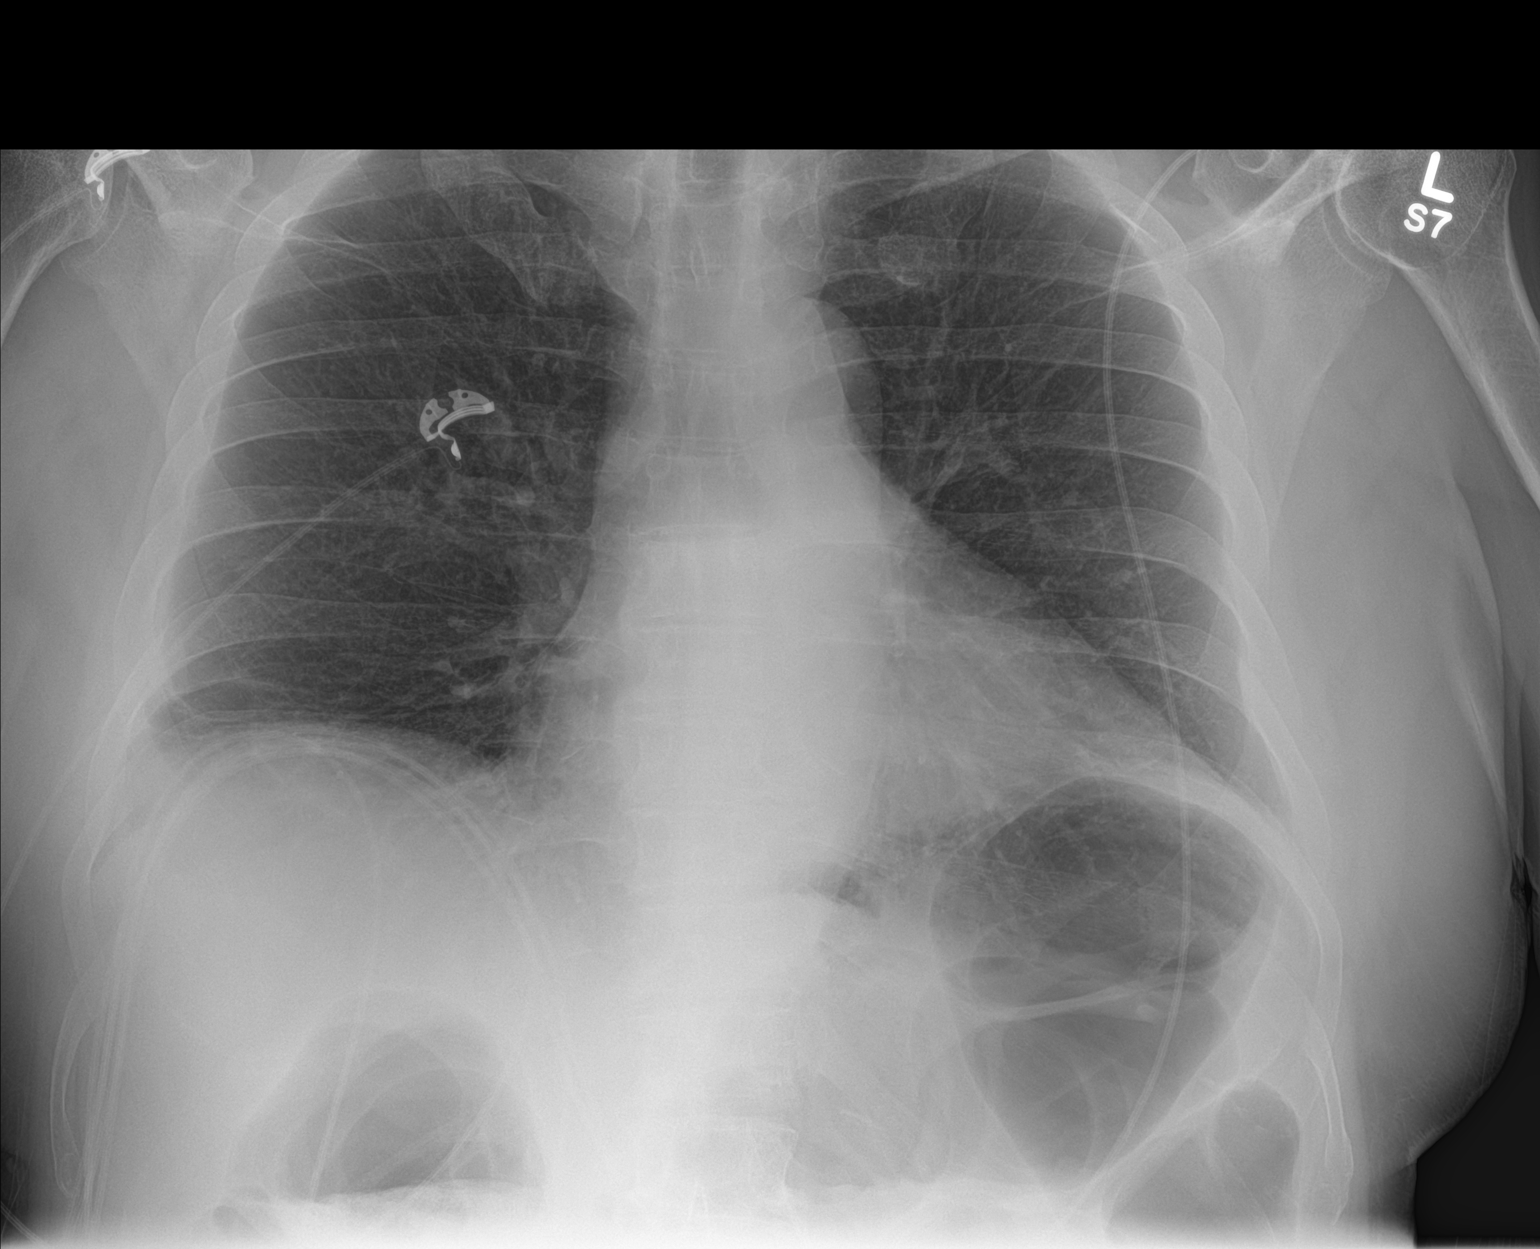

[1 of 1 positions shown; findings below may reference images not displayed]

FINDINGS: Cardiomediastinal silhouette is normal. Mediastinal contours appear
intact.

There is no evidence of focal airspace consolidation, pleural
effusion or pneumothorax. There are low lung volumes.

Osseous structures are without acute abnormality. Soft tissues are
grossly normal.
IMPRESSION: Low lung volumes, otherwise no evidence of acute cardiopulmonary
process.

## 2015-12-22 IMAGING — DX DG ABDOMEN 1V
2 series · 2 of 2 positions shown · non-contrast
Comparison: CT of the abdomen pelvis dated 08/14/2014

CLINICAL DATA: Lower abdominal pain and diarrhea for 3 days.

EXAM:
ABDOMEN - 1 VIEW

[abdomen kub (1 of 2)]
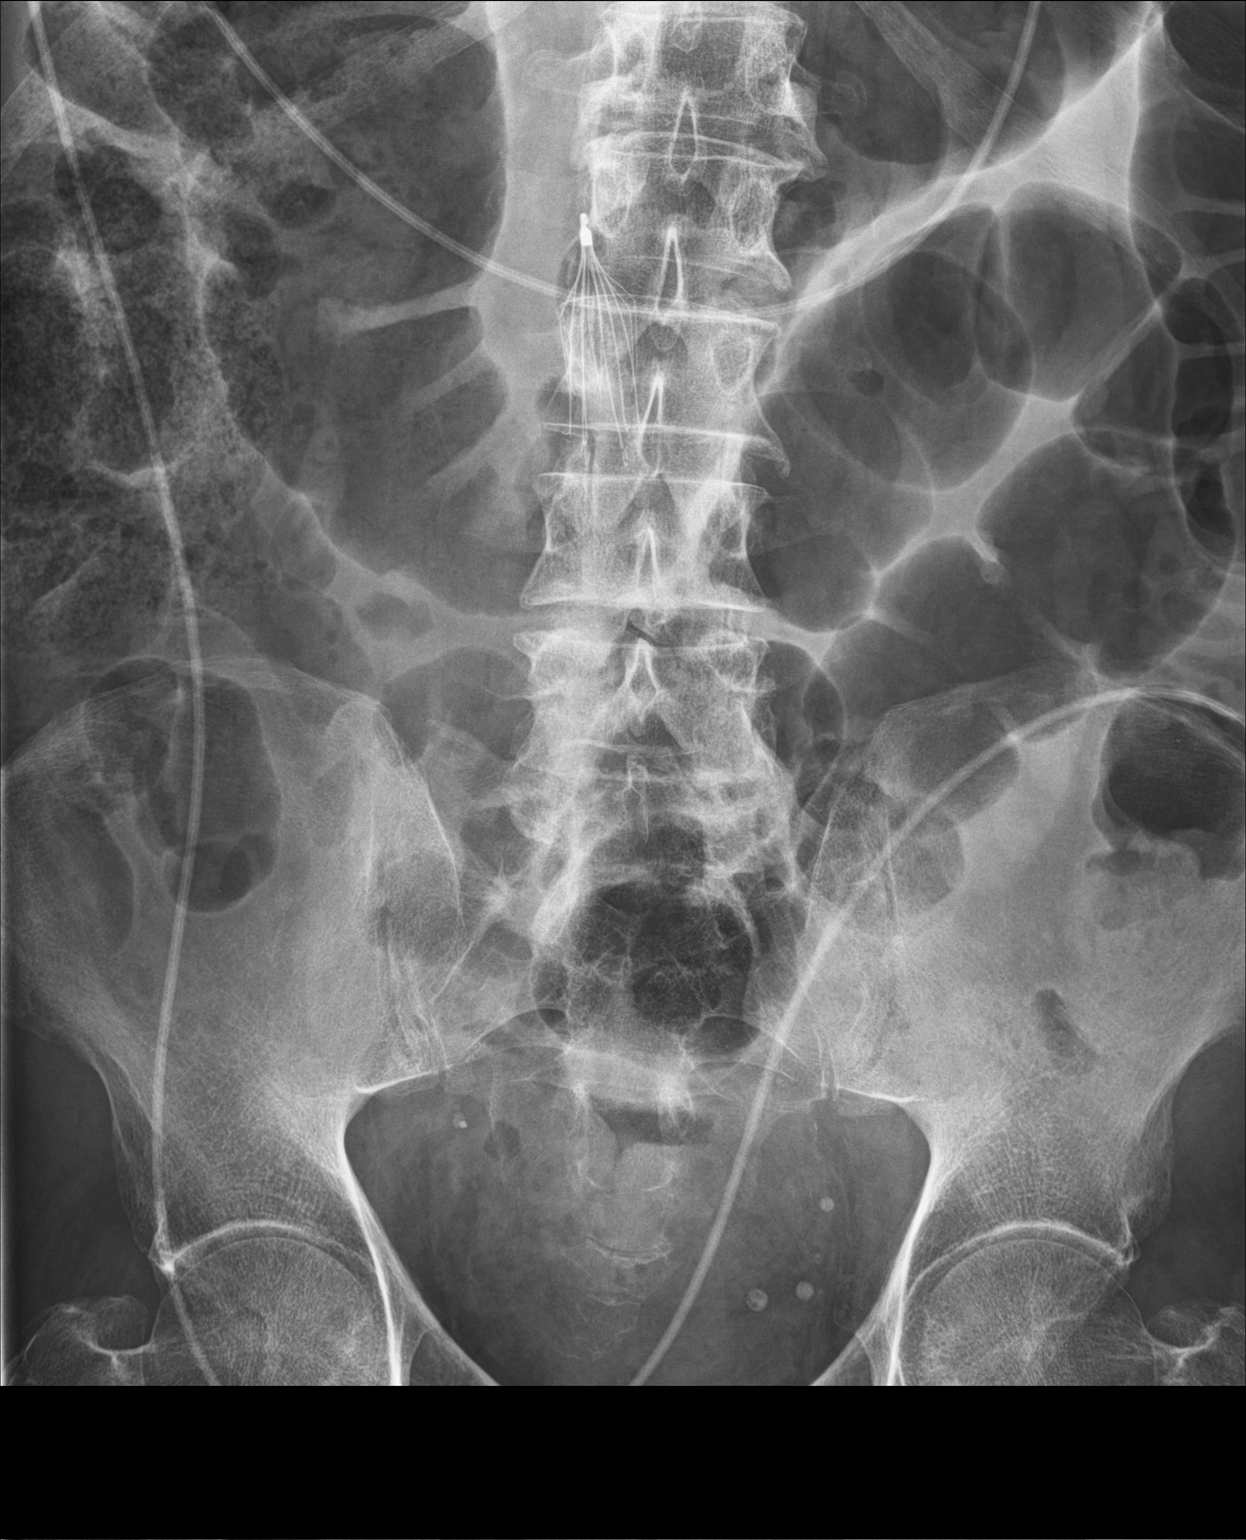

[abdomen kub (2 of 2)]
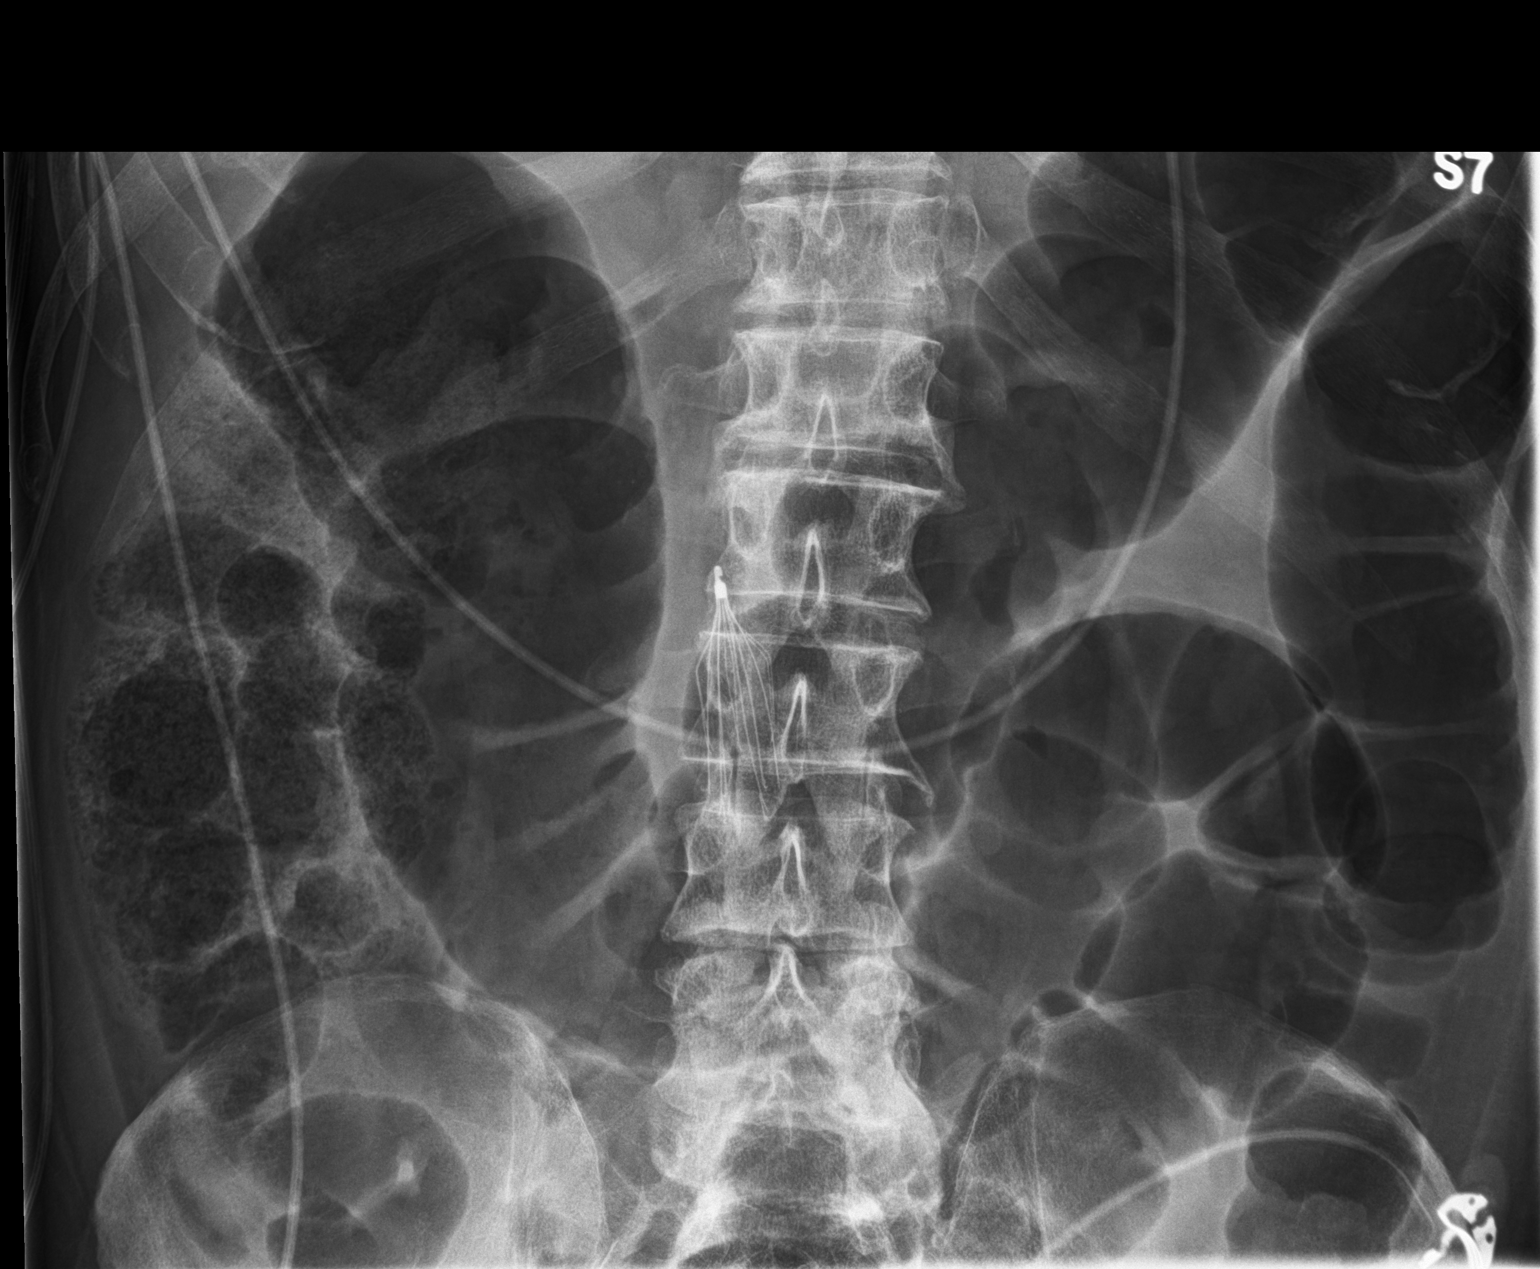

[2 of 2 positions shown; findings below may reference images not displayed]

FINDINGS: There is paucity of small bowel gas. The colon is gas distended. No
evidence of free gas on this supine radiograph. Several calcific
density overlie the expected location of the gallbladder, likely
representing gallstones. IVC filter is in place.
IMPRESSION: Diffuse gaseous distension of the colon, nonspecific finding.

Relative paucity of small bowel gas, no evidence of small-bowel
obstruction.

Cholelithiasis.

## 2015-12-22 IMAGING — NM NM PULMONARY VENT & PERF
16 series · 16 of 16 positions shown · non-contrast
Comparison: None.

CLINICAL DATA: Shortness of breath for the past few days.

EXAM:
NUCLEAR MEDICINE VENTILATION - PERFUSION LUNG SCAN
TECHNIQUE: Ventilation images were obtained in multiple projections using
inhaled aerosol Qc-RRm DTPA. Perfusion images were obtained in
multiple projections after intravenous injection of Qc-RRm MAA.
RADIOPHARMACEUTICALS:  44 Oechnetium-EEm DTPA aerosol inhalation and
6.4 Oechnetium-EEm MAA IV

[Series 1: ant/post vent · 4.14mm/px · 1 of 1 slices shown (1 of 2)]
[im 1/1]
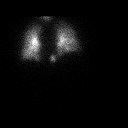

[Series 1: ant/post vent · 4.14mm/px · 1 of 1 slices shown (2 of 2)]
[im 1/1]
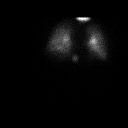

[Series 2: lao/rpo vent · 4.14mm/px · 1 of 1 slices shown (1 of 2)]
[im 1/1]
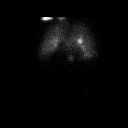

[Series 2: lao/rpo vent · 4.14mm/px · 1 of 1 slices shown (2 of 2)]
[im 1/1]
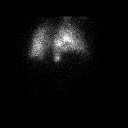

[Series 3: lt lat/rt lat vent · 4.14mm/px · 1 of 1 slices shown (1 of 2)]
[im 1/1]
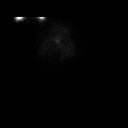

[Series 3: lt lat/rt lat vent · 4.14mm/px · 1 of 1 slices shown (2 of 2)]
[im 1/1]
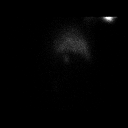

[Series 4: lpo/rao vent · 4.14mm/px · 1 of 1 slices shown (1 of 2)]
[im 1/1]
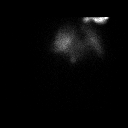

[Series 4: lpo/rao vent · 4.14mm/px · 1 of 1 slices shown (2 of 2)]
[im 1/1]
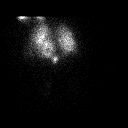

[Series 5: ant/post perf · 4.14mm/px · 1 of 1 slices shown (1 of 2)]
[im 1/1]
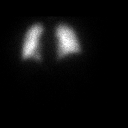

[Series 5: ant/post perf · 4.14mm/px · 1 of 1 slices shown (2 of 2)]
[im 1/1]
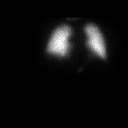

[Series 6: lao/rpo perf · 4.14mm/px · 1 of 1 slices shown (1 of 2)]
[im 1/1]
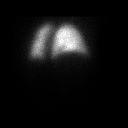

[Series 6: lao/rpo perf · 4.14mm/px · 1 of 1 slices shown (2 of 2)]
[im 1/1]
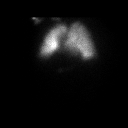

[Series 7: lt lat/rt lat perf · 4.14mm/px · 1 of 1 slices shown (1 of 2)]
[im 1/1]
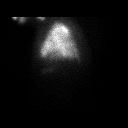

[Series 7: lt lat/rt lat perf · 4.14mm/px · 1 of 1 slices shown (2 of 2)]
[im 1/1]
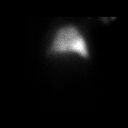

[Series 8: lpo/rao perf · 4.14mm/px · 1 of 1 slices shown (1 of 2)]
[im 1/1]
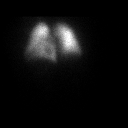

[Series 8: lpo/rao perf · 4.14mm/px · 1 of 1 slices shown (2 of 2)]
[im 1/1]
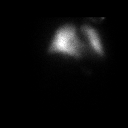

[16 of 16 positions shown; findings below may reference images not displayed]

FINDINGS: Ventilation: No focal ventilation defect.

Perfusion: No wedge shaped peripheral perfusion defects to suggest
acute pulmonary embolism.
IMPRESSION: Low probability of pulmonary embolus.

## 2015-12-27 ENCOUNTER — Ambulatory Visit (INDEPENDENT_AMBULATORY_CARE_PROVIDER_SITE_OTHER): Payer: Medicare HMO | Admitting: *Deleted

## 2015-12-27 DIAGNOSIS — E538 Deficiency of other specified B group vitamins: Secondary | ICD-10-CM | POA: Diagnosis not present

## 2016-02-05 ENCOUNTER — Other Ambulatory Visit: Payer: Self-pay | Admitting: Physician Assistant

## 2016-02-06 NOTE — Telephone Encounter (Signed)
Printed prescription shredded and script called to [facility]

## 2016-02-07 ENCOUNTER — Other Ambulatory Visit: Payer: Self-pay | Admitting: Family Medicine

## 2016-02-10 NOTE — Telephone Encounter (Signed)
Last seen 11/28/15  Dr Hyacinth MeekerMiller

## 2016-04-02 ENCOUNTER — Encounter: Payer: Self-pay | Admitting: Family Medicine

## 2016-04-02 ENCOUNTER — Ambulatory Visit (INDEPENDENT_AMBULATORY_CARE_PROVIDER_SITE_OTHER): Payer: Medicare HMO | Admitting: Family Medicine

## 2016-04-02 VITALS — BP 160/98 | HR 81 | Temp 98.0°F | Ht 67.0 in | Wt 192.8 lb

## 2016-04-02 DIAGNOSIS — I82523 Chronic embolism and thrombosis of iliac vein, bilateral: Secondary | ICD-10-CM

## 2016-04-02 DIAGNOSIS — I10 Essential (primary) hypertension: Secondary | ICD-10-CM

## 2016-04-02 NOTE — Progress Notes (Signed)
Subjective:    Patient ID: Edwin [name], male    DOB: 07/14/1951, 65 y.o.   MRN: [id number]  HPI 65 year old gentleman here to follow-up DVT and hypertension. He continues on a new anticoagulant, Eliquis, without side effects or bleeding. No longer has dependent edema. Blood pressures were elevated last 2 visits and I have asked him to do some checking at home. That did not happen.  Patient Active Problem List   Diagnosis Date Noted  . Venous stasis 09/24/2015  . DVT, bilateral lower limbs (HCC) 07/18/2015  . Renal failure 07/16/2015  . Acute kidney injury (HCC) 07/16/2015  . Generalized weakness 07/16/2015  . Volume depletion 07/16/2015  . Hypotension 07/16/2015  . Hypokalemia 07/16/2015  . Bilateral lower extremity edema 07/16/2015  . UTI (urinary tract infection) 07/16/2015  . Normocytic anemia 07/16/2015  . Screening for lipid disorders 01/31/2015  . Essential hypertension 01/31/2015  . Neuropathic pain 01/31/2015  . BPH (benign prostatic hyperplasia) 01/31/2015  . Generalized anxiety disorder 01/31/2015  . Vitamin D deficiency 01/31/2015  . Calculus in bladder 11/22/2014  . Bladder calculus 10/06/2014  . Fever 10/05/2014  . Sepsis (HCC) 10/05/2014  . S/P IVC filter 10/05/2014  . Encephalopathy   . Malnutrition of moderate degree (HCC) 08/17/2014  . Hematuria 08/17/2014  . Megaloblastic anemia due to B12 deficiency 08/15/2014  . Vitamin D deficiency 08/15/2014  . Acute encephalopathy 08/14/2014  . Adult failure to thrive 08/14/2014  . UTI (lower urinary tract infection) 08/14/2014  . Pancytopenia (HCC) 08/14/2014  . Severe protein-calorie malnutrition (HCC) 08/14/2014  . Vitamin B12 deficiency 08/14/2014  . Right leg DVT (HCC) 08/14/2014   Outpatient Encounter Prescriptions as of 04/02/2016  Medication Sig  . apixaban (ELIQUIS) 5 MG TABS tablet Take 1 tablet (5 mg total) by mouth 2 (two) times daily.  . Cholecalciferol (VITAMIN D3) 2000 UNITS TABS Take 1 tablet by  mouth daily.  . clonazePAM (KLONOPIN) 0.5 MG tablet TAKE ONE TABLET BY MOUTH ONE TIME DAILY  . cyanocobalamin (,VITAMIN B-12,) 1000 MCG/ML injection Inject 1 mL (1,000 mcg total) into the muscle once a week.  . folic acid (FOLVITE) 1 MG tablet TAKE ONE TABLET BY MOUTH ONE TIME DAILY  . furosemide (LASIX) 20 MG tablet Take 1 tablet (20 mg total) by mouth daily. As directed  . HYDROcodone-acetaminophen (NORCO/VICODIN) 5-325 MG per tablet Take 1 tablet by mouth every 6 (six) hours as needed for moderate pain.  Marland Kitchen. lisinopril-hydrochlorothiazide (ZESTORETIC) 20-12.5 MG tablet Take 1 tablet by mouth daily.  . pantoprazole (PROTONIX) 40 MG tablet Take 1 tablet (40 mg total) by mouth daily.  . Prenatal Vit-Fe Fumarate-FA (MULTIVITAMIN-PRENATAL) 27-0.8 MG TABS tablet Take 1 tablet by mouth daily at 12 noon.  . tamsulosin (FLOMAX) 0.4 MG CAPS capsule Take 1 capsule (0.4 mg total) by mouth daily.  . [DISCONTINUED] gabapentin (NEURONTIN) 100 MG capsule Take 1 capsule (100 mg total) by mouth 3 (three) times daily.   Facility-Administered Encounter Medications as of 04/02/2016  Medication  . cyanocobalamin ((VITAMIN B-12)) injection 1,000 mcg      Review of Systems  Constitutional: Negative.   Respiratory: Negative.   Cardiovascular: Negative.   Genitourinary: Negative.   Neurological: Negative.   Psychiatric/Behavioral: Negative.        Objective:   Physical Exam  Constitutional: He is oriented to person, place, and time. He appears well-nourished.  Cardiovascular: Normal rate, regular rhythm and normal heart sounds.   Pulmonary/Chest: Effort normal and breath sounds normal.  Musculoskeletal: Normal range of  motion.  Neurological: He is oriented to person, place, and time.  Psychiatric: He has a normal mood and affect. His behavior is normal.   BP 160/94 mmHg  Pulse 81  Temp(Src) 98 F (36.7 C) (Oral)  Ht  (1.702 m)  Wt 192 lb 12.8 oz (87.454 kg)  BMI 30.19 kg/m2          Assessment & Plan:  1. Essential hypertension Blood pressure is still elevated. Given the son a form to take home and monitor pressure twice a week sometimes in the a.m. sometimes in the p.m. with instructions to return in one month with this diary of his blood pressure recordings. At that time I will decide if he needs to be treated with another medicine in addition to his lisinopril and hydrochlorothiazide  2. Chronic deep vein thrombosis (DVT) of iliac vein of both lower extremities (HCC) He will continue on Eliquis indefinitely.  Frederica Kuster MD

## 2016-05-05 ENCOUNTER — Ambulatory Visit: Payer: Medicare HMO | Admitting: Family Medicine

## 2016-05-06 ENCOUNTER — Encounter: Payer: Self-pay | Admitting: Family Medicine

## 2016-05-08 ENCOUNTER — Other Ambulatory Visit: Payer: Self-pay | Admitting: *Deleted

## 2016-05-08 MED ORDER — TAMSULOSIN HCL 0.4 MG PO CAPS: 0.4000 mg | ORAL_CAPSULE | Freq: Every day | ORAL | Status: DC

## 2016-05-08 MED ORDER — PANTOPRAZOLE SODIUM 40 MG PO TBEC: 40.0000 mg | DELAYED_RELEASE_TABLET | Freq: Every day | ORAL | Status: DC

## 2016-05-08 MED ORDER — FUROSEMIDE 20 MG PO TABS: 20.0000 mg | ORAL_TABLET | Freq: Every day | ORAL | Status: DC

## 2016-05-21 ENCOUNTER — Other Ambulatory Visit: Payer: Self-pay | Admitting: *Deleted

## 2016-05-21 MED ORDER — PRENATAL 27-0.8 MG PO TABS: 1.0000 | ORAL_TABLET | Freq: Every day | ORAL | 6 refills | Status: DC

## 2016-05-28 ENCOUNTER — Encounter: Payer: Self-pay | Admitting: Family Medicine

## 2016-05-28 ENCOUNTER — Ambulatory Visit (INDEPENDENT_AMBULATORY_CARE_PROVIDER_SITE_OTHER): Payer: Medicare HMO | Admitting: Family Medicine

## 2016-05-28 VITALS — BP 165/89 | HR 87 | Temp 97.1°F | Ht 67.0 in | Wt 188.0 lb

## 2016-05-28 DIAGNOSIS — I10 Essential (primary) hypertension: Secondary | ICD-10-CM

## 2016-05-28 DIAGNOSIS — R69 Illness, unspecified: Secondary | ICD-10-CM | POA: Diagnosis not present

## 2016-05-28 DIAGNOSIS — F411 Generalized anxiety disorder: Secondary | ICD-10-CM | POA: Diagnosis not present

## 2016-05-28 DIAGNOSIS — I825Z1 Chronic embolism and thrombosis of unspecified deep veins of right distal lower extremity: Secondary | ICD-10-CM | POA: Diagnosis not present

## 2016-05-28 NOTE — Progress Notes (Signed)
Subjective:    Patient ID: Edwin [name], male    DOB: [birth year], 65 y.o.   MRN: [id number]  HPI Patient here today for 2 month follow up on HTN. Patient is currently taking furosemide and lisinopril with hydrochlorothiazide. He brings in a record of pressures at home and generally when averaged pressures are in acceptable range. Blood pressure today in the office is elevated. Patient is asymptomatic in terms of no headache, dizziness, or chest pain.    Patient Active Problem List   Diagnosis Date Noted  . Venous stasis 09/24/2015  . DVT, bilateral lower limbs (HCC) 07/18/2015  . Renal failure 07/16/2015  . Acute kidney injury (HCC) 07/16/2015  . Generalized weakness 07/16/2015  . Volume depletion 07/16/2015  . Hypotension 07/16/2015  . Hypokalemia 07/16/2015  . Bilateral lower extremity edema 07/16/2015  . UTI (urinary tract infection) 07/16/2015  . Normocytic anemia 07/16/2015  . Screening for lipid disorders 01/31/2015  . Essential hypertension 01/31/2015  . Neuropathic pain 01/31/2015  . BPH (benign prostatic hyperplasia) 01/31/2015  . Generalized anxiety disorder 01/31/2015  . Vitamin D deficiency 01/31/2015  . Calculus in bladder 11/22/2014  . Bladder calculus 10/06/2014  . Fever 10/05/2014  . Sepsis (HCC) 10/05/2014  . S/P IVC filter 10/05/2014  . Encephalopathy   . Malnutrition of moderate degree (HCC) 08/17/2014  . Hematuria 08/17/2014  . Megaloblastic anemia due to B12 deficiency 08/15/2014  . Vitamin D deficiency 08/15/2014  . Acute encephalopathy 08/14/2014  . Adult failure to thrive 08/14/2014  . UTI (lower urinary tract infection) 08/14/2014  . Pancytopenia (HCC) 08/14/2014  . Severe protein-calorie malnutrition (HCC) 08/14/2014  . Vitamin B12 deficiency 08/14/2014  . Right leg DVT (HCC) 08/14/2014   Outpatient Encounter Prescriptions as of 05/28/2016  Medication Sig  . apixaban (ELIQUIS) 5 MG TABS tablet Take 1 tablet (5 mg total) by mouth 2 (two) times  daily.  . Cholecalciferol (VITAMIN D3) 2000 UNITS TABS Take 1 tablet by mouth daily.  . clonazePAM (KLONOPIN) 0.5 MG tablet TAKE ONE TABLET BY MOUTH ONE TIME DAILY  . cyanocobalamin (,VITAMIN B-12,) 1000 MCG/ML injection Inject 1 mL (1,000 mcg total) into the muscle once a week.  . folic acid (FOLVITE) 1 MG tablet TAKE ONE TABLET BY MOUTH ONE TIME DAILY  . furosemide (LASIX) 20 MG tablet Take 1 tablet (20 mg total) by mouth daily. As directed  . HYDROcodone-acetaminophen (NORCO/VICODIN) 5-325 MG per tablet Take 1 tablet by mouth every 6 (six) hours as needed for moderate pain.  Marland Kitchen lisinopril-hydrochlorothiazide (ZESTORETIC) 20-12.5 MG tablet Take 1 tablet by mouth daily.  . pantoprazole (PROTONIX) 40 MG tablet Take 1 tablet (40 mg total) by mouth daily.  . Prenatal Vit-Fe Fumarate-FA (MULTIVITAMIN-PRENATAL) 27-0.8 MG TABS tablet Take 1 tablet by mouth daily at 12 noon.  . tamsulosin (FLOMAX) 0.4 MG CAPS capsule Take 1 capsule (0.4 mg total) by mouth daily.   Facility-Administered Encounter Medications as of 05/28/2016  Medication  . cyanocobalamin ((VITAMIN B-12)) injection 1,000 mcg      Review of Systems  Constitutional: Negative.   HENT: Negative.   Eyes: Negative.   Respiratory: Negative.   Cardiovascular: Negative.   Gastrointestinal: Negative.   Endocrine: Negative.   Genitourinary: Negative.   Musculoskeletal: Negative.   Skin: Negative.   Allergic/Immunologic: Negative.   Neurological: Negative.   Hematological: Negative.   Psychiatric/Behavioral: Negative.        Objective:   Physical Exam  Constitutional: He is oriented to person, place, and time. He appears well-developed  and well-nourished.  Eyes: Pupils are equal, round, and reactive to light.  Cardiovascular: Normal rate, regular rhythm and normal heart sounds.   Pulmonary/Chest: Effort normal and breath sounds normal.  Neurological: He is alert and oriented to person, place, and time.   BP (!) 165/89 (BP  Location: Left Arm)   Pulse 87   Temp 97.1 F (36.2 C) (Oral)   Ht  (1.702 m)   Wt 188 lb (85.3 kg)   BMI 29.44 kg/m         Assessment & Plan:  1. Chronic deep vein thrombosis (DVT) of distal vein of right lower extremity (HCC) He has had at least 2 DVTs so we'll probably need to stay on anticoagulant indefinitely  2. Generalized anxiety disorder Exclude an once a day sometimes 2 times a day and this controls his symptoms  3. Essential hypertension As noted above, blood pressures at home have been acceptable. Continue same regimen. Recheck in 3 months  Frederica Kuster MD

## 2016-06-05 ENCOUNTER — Other Ambulatory Visit: Payer: Self-pay | Admitting: Family Medicine

## 2016-06-05 NOTE — Telephone Encounter (Signed)
[facility] pt, last filled 05/07/16. Last seen 05/28/16. Call in

## 2016-07-06 ENCOUNTER — Other Ambulatory Visit: Payer: Self-pay | Admitting: Family Medicine

## 2016-08-04 ENCOUNTER — Other Ambulatory Visit: Payer: Self-pay | Admitting: *Deleted

## 2016-08-04 ENCOUNTER — Other Ambulatory Visit: Payer: Self-pay | Admitting: Family Medicine

## 2016-08-04 DIAGNOSIS — I10 Essential (primary) hypertension: Secondary | ICD-10-CM

## 2016-08-04 MED ORDER — PANTOPRAZOLE SODIUM 40 MG PO TBEC: 40.0000 mg | DELAYED_RELEASE_TABLET | Freq: Every day | ORAL | 0 refills | Status: DC

## 2016-08-04 MED ORDER — TAMSULOSIN HCL 0.4 MG PO CAPS: 0.4000 mg | ORAL_CAPSULE | Freq: Every day | ORAL | 0 refills | Status: DC

## 2016-08-04 MED ORDER — APIXABAN 5 MG PO TABS: 5.0000 mg | ORAL_TABLET | Freq: Two times a day (BID) | ORAL | 0 refills | Status: DC

## 2016-08-04 MED ORDER — FUROSEMIDE 20 MG PO TABS: 20.0000 mg | ORAL_TABLET | Freq: Every day | ORAL | 0 refills | Status: DC

## 2016-08-04 MED ORDER — LISINOPRIL-HYDROCHLOROTHIAZIDE 20-12.5 MG PO TABS: 1.0000 | ORAL_TABLET | Freq: Every day | ORAL | 0 refills | Status: DC

## 2016-08-04 NOTE — Telephone Encounter (Signed)
Klonopin last filled 07/06/16, last seen 05/28/16. [facility]

## 2016-08-04 NOTE — Telephone Encounter (Signed)
Refill called to [facility] 

## 2016-09-03 ENCOUNTER — Ambulatory Visit: Payer: Medicare HMO | Admitting: Family Medicine

## 2016-09-09 ENCOUNTER — Ambulatory Visit (INDEPENDENT_AMBULATORY_CARE_PROVIDER_SITE_OTHER): Payer: Medicare HMO | Admitting: Family Medicine

## 2016-09-09 ENCOUNTER — Encounter: Payer: Self-pay | Admitting: Family Medicine

## 2016-09-09 VITALS — BP 175/108 | HR 110 | Temp 96.6°F | Ht 67.0 in | Wt 183.0 lb

## 2016-09-09 DIAGNOSIS — R000 Tachycardia, unspecified: Secondary | ICD-10-CM

## 2016-09-09 DIAGNOSIS — I10 Essential (primary) hypertension: Secondary | ICD-10-CM

## 2016-09-09 NOTE — Progress Notes (Signed)
Subjective:    Patient ID: Edwin [name], male    DOB: 1951/08/09, 64 y.o.   MRN: [id number]  HPI 65 year old gentleman who who is followed for high blood pressure, history of DVT on anticoagulant anemia Today after walking in from the car he complains of feeling tired. He has a tachycardia and blood pressure is elevated at 161/101 repeat blood pressure 175/108. He monitors his pressures at home and they have been great. He asked that brought his cuff with him today and we compared to our cuff readings are similar. While he was on the examining table I had him use his blood pressure cuff and it read in the vicinity of 190/130. I had him rest for a minute repeat pressure did come down in the vicinity of 140/90 Patient Active Problem List   Diagnosis Date Noted  . Venous stasis 09/24/2015  . DVT, bilateral lower limbs ([place]) 07/18/2015  . Renal failure 07/16/2015  . Acute kidney injury ([place]) 07/16/2015  . Generalized weakness 07/16/2015  . Volume depletion 07/16/2015  . Hypotension 07/16/2015  . Hypokalemia 07/16/2015  . Bilateral lower extremity edema 07/16/2015  . UTI (urinary tract infection) 07/16/2015  . Normocytic anemia 07/16/2015  . Screening for lipid disorders 01/31/2015  . Essential hypertension 01/31/2015  . Neuropathic pain 01/31/2015  . BPH (benign prostatic hyperplasia) 01/31/2015  . Generalized anxiety disorder 01/31/2015  . Vitamin D deficiency 01/31/2015  . Calculus in bladder 11/22/2014  . Bladder calculus 10/06/2014  . Fever 10/05/2014  . Sepsis ([place]) 10/05/2014  . S/P IVC filter 10/05/2014  . Encephalopathy   . Malnutrition of moderate degree ([place]) 08/17/2014  . Hematuria 08/17/2014  . Megaloblastic anemia due to B12 deficiency 08/15/2014  . Vitamin D deficiency 08/15/2014  . Acute encephalopathy 08/14/2014  . Adult failure to thrive 08/14/2014  . UTI (lower urinary tract infection) 08/14/2014  . Pancytopenia ([place]) 08/14/2014  . Severe protein-calorie  malnutrition ([place]) 08/14/2014  . Vitamin B12 deficiency 08/14/2014  . Right leg DVT ([place]) 08/14/2014   Outpatient Encounter Prescriptions as of 09/09/2016  Medication Sig  . apixaban (ELIQUIS) 5 MG TABS tablet Take 1 tablet (5 mg total) by mouth 2 (two) times daily.  . Cholecalciferol (VITAMIN D3) 2000 UNITS TABS Take 1 tablet by mouth daily.  . clonazePAM (KLONOPIN) 0.5 MG tablet TAKE 1 TABLET ONCE A DAY  . cyanocobalamin (,VITAMIN B-12,) 1000 MCG/ML injection Inject 1 mL (1,000 mcg total) into the muscle once a week.  . folic acid (FOLVITE) 1 MG tablet TAKE ONE TABLET BY MOUTH ONE TIME DAILY  . furosemide (LASIX) 20 MG tablet Take 1 tablet (20 mg total) by mouth daily. As directed  . HYDROcodone-acetaminophen (NORCO/VICODIN) 5-325 MG per tablet Take 1 tablet by mouth every 6 (six) hours as needed for moderate pain.  Marland Kitchen lisinopril-hydrochlorothiazide (ZESTORETIC) 20-12.5 MG tablet Take 1 tablet by mouth daily.  . pantoprazole (PROTONIX) 40 MG tablet Take 1 tablet (40 mg total) by mouth daily.  . Prenatal Vit-Fe Fumarate-FA (MULTIVITAMIN-PRENATAL) 27-0.8 MG TABS tablet Take 1 tablet by mouth daily at 12 noon.  . tamsulosin (FLOMAX) 0.4 MG CAPS capsule Take 1 capsule (0.4 mg total) by mouth daily.   Facility-Administered Encounter Medications as of 09/09/2016  Medication  . cyanocobalamin ((VITAMIN B-12)) injection 1,000 mcg      Review of Systems  Constitutional: Positive for fatigue.  Respiratory: Positive for shortness of breath.   Cardiovascular: Negative.   Neurological: Negative.   Psychiatric/Behavioral: Negative.  Objective:   Physical Exam  Constitutional: He is oriented to person, place, and time. He appears well-developed and well-nourished.  Cardiovascular: Normal rate, regular rhythm and normal heart sounds.   Pulmonary/Chest: Breath sounds normal.  Neurological: He is alert and oriented to person, place, and time.  Psychiatric: He has a normal mood and affect.  His behavior is normal.   BP (!) 175/108   Pulse (!) 110   Temp (!) 96.6 F (35.9 C) (Oral)   Ht _0  (1.702 m)   Wt 183 lb (83 kg)   BMI 28.66 kg/m         Assessment & Plan:  1. Tachycardia Concerned that he may be anemic. - CBC with Differential/Platelet  2. Essential hypertension Consider another blood pressure medicine but he has had issues with hypovolemia and hypotension so will hold off for now. Continue to monitor pressure. Check electrolytes and renal function - BMP8+EGFR Wardell Honour MD

## 2016-09-10 LAB — CBC WITH DIFFERENTIAL/PLATELET
BASOS ABS: 0 10*3/uL (ref 0.0–0.2)
Basos: 0 %
EOS (ABSOLUTE): 0.2 10*3/uL (ref 0.0–0.4)
EOS: 2 %
HEMATOCRIT: 45 % (ref 37.5–51.0)
HEMOGLOBIN: 15.3 g/dL (ref 12.6–17.7)
IMMATURE GRANS (ABS): 0 10*3/uL (ref 0.0–0.1)
Immature Granulocytes: 0 %
LYMPHS ABS: 1.7 10*3/uL (ref 0.7–3.1)
LYMPHS: 20 %
MCH: 29.3 pg (ref 26.6–33.0)
MCHC: 34 g/dL (ref 31.5–35.7)
MCV: 86 fL (ref 79–97)
MONOCYTES: 4 %
Monocytes Absolute: 0.4 10*3/uL (ref 0.1–0.9)
NEUTROS ABS: 6.3 10*3/uL (ref 1.4–7.0)
Neutrophils: 74 %
Platelets: 294 10*3/uL (ref 150–379)
RBC: 5.23 x10E6/uL (ref 4.14–5.80)
RDW: 14.5 % (ref 12.3–15.4)
WBC: 8.5 10*3/uL (ref 3.4–10.8)

## 2016-09-10 LAB — BMP8+EGFR
BUN / CREAT RATIO: 20 (ref 10–24)
BUN: 19 mg/dL (ref 8–27)
CALCIUM: 10.3 mg/dL — AB (ref 8.6–10.2)
CHLORIDE: 98 mmol/L (ref 96–106)
CO2: 24 mmol/L (ref 18–29)
CREATININE: 0.96 mg/dL (ref 0.76–1.27)
GFR, EST AFRICAN AMERICAN: 94 mL/min/{1.73_m2} (ref >59)
GFR, EST NON AFRICAN AMERICAN: 81 mL/min/{1.73_m2} (ref >59)
Glucose: 136 mg/dL — ABNORMAL HIGH (ref 65–99)
Potassium: 4 mmol/L (ref 3.5–5.2)
Sodium: 141 mmol/L (ref 134–144)

## 2016-10-02 ENCOUNTER — Other Ambulatory Visit: Payer: Self-pay | Admitting: Family Medicine

## 2016-10-05 NOTE — Telephone Encounter (Signed)
[facility] Okayed per Dr Ermalinda MemosBradshaw Must be seen for further refill

## 2016-11-02 ENCOUNTER — Other Ambulatory Visit: Payer: Self-pay | Admitting: Family Medicine

## 2016-11-02 DIAGNOSIS — I10 Essential (primary) hypertension: Secondary | ICD-10-CM

## 2016-11-03 NOTE — Telephone Encounter (Signed)
Refill called to [facility] 

## 2016-11-12 ENCOUNTER — Ambulatory Visit: Payer: Medicare HMO | Admitting: Family Medicine

## 2016-11-17 NOTE — Progress Notes (Signed)
Subjective:    Patient ID: Edwin [name], male    DOB: 1951-10-08, 66 y.o.   MRN: [id number]  HPI 66 year old male who is here to follow-up blood pressure. He has history of CVA, protein calorie malnutrition. He is accompanied by nephew who helps him monitor his blood pressure. He has no specific complaints today  Patient Active Problem List   Diagnosis Date Noted  . Venous stasis 09/24/2015  . DVT, bilateral lower limbs ([place]) 07/18/2015  . Renal failure 07/16/2015  . Acute kidney injury ([place]) 07/16/2015  . Generalized weakness 07/16/2015  . Volume depletion 07/16/2015  . Hypotension 07/16/2015  . Hypokalemia 07/16/2015  . Bilateral lower extremity edema 07/16/2015  . UTI (urinary tract infection) 07/16/2015  . Normocytic anemia 07/16/2015  . Screening for lipid disorders 01/31/2015  . Essential hypertension 01/31/2015  . Neuropathic pain 01/31/2015  . BPH (benign prostatic hyperplasia) 01/31/2015  . Generalized anxiety disorder 01/31/2015  . Vitamin D deficiency 01/31/2015  . Calculus in bladder 11/22/2014  . Bladder calculus 10/06/2014  . Fever 10/05/2014  . Sepsis ([place]) 10/05/2014  . S/P IVC filter 10/05/2014  . Encephalopathy   . Malnutrition of moderate degree ([place]) 08/17/2014  . Hematuria 08/17/2014  . Megaloblastic anemia due to B12 deficiency 08/15/2014  . Vitamin D deficiency 08/15/2014  . Acute encephalopathy 08/14/2014  . Adult failure to thrive 08/14/2014  . UTI (lower urinary tract infection) 08/14/2014  . Pancytopenia ([place]) 08/14/2014  . Severe protein-calorie malnutrition ([place]) 08/14/2014  . Vitamin B12 deficiency 08/14/2014  . Right leg DVT ([place]) 08/14/2014   Outpatient Encounter Prescriptions as of 11/18/2016  Medication Sig  . Cholecalciferol (VITAMIN D3) 2000 UNITS TABS Take 1 tablet by mouth daily.  . clonazePAM (KLONOPIN) 0.5 MG tablet TAKE 1 TABLET ONCE A DAY  . cyanocobalamin (,VITAMIN B-12,) 1000 MCG/ML injection Inject 1 mL (1,000 mcg total) into  the muscle once a week.  Marland Kitchen ELIQUIS 5 MG TABS tablet TAKE  (1)  TABLET TWICE A DAY.  . folic acid (FOLVITE) 1 MG tablet TAKE 1 TABLET ONCE A DAY  . furosemide (LASIX) 20 MG tablet TAKE (1) TABLET DAILY AS DIRECTED.  Marland Kitchen HYDROcodone-acetaminophen (NORCO/VICODIN) 5-325 MG per tablet Take 1 tablet by mouth every 6 (six) hours as needed for moderate pain.  Marland Kitchen lisinopril-hydrochlorothiazide (PRINZIDE,ZESTORETIC) 20-12.5 MG tablet TAKE 1 TABLET DAILY  . pantoprazole (PROTONIX) 40 MG tablet TAKE 1 TABLET DAILY  . Prenatal Vit-Fe Fumarate-FA (MULTIVITAMIN-PRENATAL) 27-0.8 MG TABS tablet Take 1 tablet by mouth daily at 12 noon.  . tamsulosin (FLOMAX) 0.4 MG CAPS capsule TAKE (1) CAPSULE DAILY   Facility-Administered Encounter Medications as of 11/18/2016  Medication  . cyanocobalamin ((VITAMIN B-12)) injection 1,000 mcg      Review of Systems  Constitutional: Negative.   Respiratory: Negative.   Cardiovascular: Negative.   Neurological: Negative.   Psychiatric/Behavioral: Negative.        Objective:   Physical Exam  Constitutional: He is oriented to person, place, and time. He appears well-developed and well-nourished.  Cardiovascular: Normal rate and regular rhythm.   Pulmonary/Chest: Effort normal and breath sounds normal.  Neurological: He is alert and oriented to person, place, and time.  Psychiatric: He has a normal mood and affect. His behavior is normal.   BP (!) 169/93   Pulse 75   Temp 98.1 F (36.7 C) (Oral)   Ht 5' 7"  (1.702 m)   Wt 183 lb 12.8 oz (83.4 kg)   BMI 28.79 kg/m  Assessment & Plan:  1. Essential hypertension He brings in a record of daily blood pressure check some a.m. and some p.m. and in general they are acceptable - CMP14+EGFR  2. Malnutrition of moderate degree (HCC) Appetite appears good will check CMP looking at protein albumin etc. - CMP14+EGFR  3. Screening for lipid disorders When last checked about one year ago there was moderate  elevation of LDL and triglycerides - Lipid panel  4. Chronic deep vein thrombosis (DVT) of distal vein of right lower extremity [facility]) He continues to take L Quist without problems  Wardell Honour MD

## 2016-11-18 ENCOUNTER — Ambulatory Visit (INDEPENDENT_AMBULATORY_CARE_PROVIDER_SITE_OTHER): Payer: Medicare HMO | Admitting: Family Medicine

## 2016-11-18 ENCOUNTER — Encounter (INDEPENDENT_AMBULATORY_CARE_PROVIDER_SITE_OTHER): Payer: Self-pay

## 2016-11-18 ENCOUNTER — Encounter: Payer: Self-pay | Admitting: Family Medicine

## 2016-11-18 VITALS — BP 169/93 | HR 75 | Temp 98.1°F | Ht 67.0 in | Wt 183.8 lb

## 2016-11-18 DIAGNOSIS — Z13220 Encounter for screening for lipoid disorders: Secondary | ICD-10-CM

## 2016-11-18 DIAGNOSIS — I10 Essential (primary) hypertension: Secondary | ICD-10-CM

## 2016-11-18 DIAGNOSIS — E43 Unspecified severe protein-calorie malnutrition: Secondary | ICD-10-CM

## 2016-11-18 DIAGNOSIS — E440 Moderate protein-calorie malnutrition: Secondary | ICD-10-CM | POA: Diagnosis not present

## 2016-11-18 DIAGNOSIS — I825Z1 Chronic embolism and thrombosis of unspecified deep veins of right distal lower extremity: Secondary | ICD-10-CM

## 2016-11-18 LAB — LIPID PANEL
CHOLESTEROL TOTAL: 219 mg/dL — AB (ref 100–199)
Chol/HDL Ratio: 7.8 ratio units — ABNORMAL HIGH (ref 0.0–5.0)
HDL: 28 mg/dL — AB (ref >39)
LDL CALC: 143 mg/dL — AB (ref 0–99)
TRIGLYCERIDES: 242 mg/dL — AB (ref 0–149)
VLDL CHOLESTEROL CAL: 48 mg/dL — AB (ref 5–40)

## 2016-11-18 LAB — CMP14+EGFR
ALBUMIN: 4.7 g/dL (ref 3.6–4.8)
ALK PHOS: 74 IU/L (ref 39–117)
ALT: 11 IU/L (ref 0–44)
AST: 14 IU/L (ref 0–40)
Albumin/Globulin Ratio: 1.6 (ref 1.2–2.2)
BUN / CREAT RATIO: 17 (ref 10–24)
BUN: 19 mg/dL (ref 8–27)
Bilirubin Total: 0.9 mg/dL (ref 0.0–1.2)
CHLORIDE: 101 mmol/L (ref 96–106)
CO2: 25 mmol/L (ref 18–29)
CREATININE: 1.14 mg/dL (ref 0.76–1.27)
Calcium: 9.9 mg/dL (ref 8.6–10.2)
GFR calc Af Amer: 77 mL/min/{1.73_m2} (ref >59)
GFR calc non Af Amer: 66 mL/min/{1.73_m2} (ref >59)
GLUCOSE: 123 mg/dL — AB (ref 65–99)
Globulin, Total: 2.9 g/dL (ref 1.5–4.5)
Potassium: 4.2 mmol/L (ref 3.5–5.2)
Sodium: 141 mmol/L (ref 134–144)
Total Protein: 7.6 g/dL (ref 6.0–8.5)

## 2016-12-05 ENCOUNTER — Other Ambulatory Visit: Payer: Self-pay | Admitting: Family Medicine

## 2016-12-10 DIAGNOSIS — K219 Gastro-esophageal reflux disease without esophagitis: Secondary | ICD-10-CM | POA: Diagnosis not present

## 2016-12-10 DIAGNOSIS — Z0000 Encounter for general adult medical examination without abnormal findings: Secondary | ICD-10-CM | POA: Diagnosis not present

## 2016-12-10 DIAGNOSIS — I10 Essential (primary) hypertension: Secondary | ICD-10-CM | POA: Diagnosis not present

## 2016-12-10 DIAGNOSIS — N400 Enlarged prostate without lower urinary tract symptoms: Secondary | ICD-10-CM | POA: Diagnosis not present

## 2016-12-10 DIAGNOSIS — K089 Disorder of teeth and supporting structures, unspecified: Secondary | ICD-10-CM | POA: Diagnosis not present

## 2016-12-10 DIAGNOSIS — R69 Illness, unspecified: Secondary | ICD-10-CM | POA: Diagnosis not present

## 2016-12-10 DIAGNOSIS — R600 Localized edema: Secondary | ICD-10-CM | POA: Diagnosis not present

## 2016-12-10 DIAGNOSIS — Z7902 Long term (current) use of antithrombotics/antiplatelets: Secondary | ICD-10-CM | POA: Diagnosis not present

## 2016-12-10 DIAGNOSIS — Z6828 Body mass index (BMI) 28.0-28.9, adult: Secondary | ICD-10-CM | POA: Diagnosis not present

## 2017-01-02 ENCOUNTER — Other Ambulatory Visit: Payer: Self-pay | Admitting: Family Medicine

## 2017-01-29 ENCOUNTER — Other Ambulatory Visit: Payer: Self-pay | Admitting: Family Medicine

## 2017-01-29 DIAGNOSIS — I10 Essential (primary) hypertension: Secondary | ICD-10-CM

## 2017-02-04 NOTE — Telephone Encounter (Signed)
Pt is aware he will ntbs prior to having Klonopin refilled.

## 2017-03-18 ENCOUNTER — Ambulatory Visit: Payer: Medicare HMO | Admitting: Family Medicine

## 2017-03-26 ENCOUNTER — Ambulatory Visit: Payer: Medicare HMO | Admitting: Family Medicine

## 2017-03-30 ENCOUNTER — Encounter: Payer: Self-pay | Admitting: Family Medicine

## 2017-03-30 ENCOUNTER — Ambulatory Visit (INDEPENDENT_AMBULATORY_CARE_PROVIDER_SITE_OTHER): Payer: Medicare HMO | Admitting: Family Medicine

## 2017-03-30 VITALS — BP 198/100 | HR 86 | Temp 97.2°F | Ht 67.0 in | Wt 178.4 lb

## 2017-03-30 DIAGNOSIS — I10 Essential (primary) hypertension: Secondary | ICD-10-CM

## 2017-03-30 DIAGNOSIS — I82523 Chronic embolism and thrombosis of iliac vein, bilateral: Secondary | ICD-10-CM

## 2017-03-30 DIAGNOSIS — Z0000 Encounter for general adult medical examination without abnormal findings: Secondary | ICD-10-CM

## 2017-03-30 NOTE — Patient Instructions (Signed)
Great to meet you!  PLease keep a blood pressure log and bring it in next week when you get a nurse blood pressure check  Come back to see me in 4 months

## 2017-03-30 NOTE — Progress Notes (Signed)
   HPI  Patient presents today here to follow-up for chronic medical conditions and to establish care, his previous provider has retired.  Hypertension Good medication compliance, however he states that he has missed it over the last 2 days States that at home his blood pressure is average 120s and 130s over 80s. No chest pain, dyspnea, palpitations, leg edema.  Patient has reported more than 2 DVTs in his lifetime, he denies any bleeding sources with Edwin [name] was. He has good medication compliance. He does not want labs today.  Patient has never had a colonoscopy, he does not want a colonoscopy and declines my offer for referral today.  PMH: Smoking status noted ROS: Per HPI  Objective: BP (!) 198/100   Pulse 86   Temp 97.2 F (36.2 C) (Oral)   Ht 5\' 7"  (1.702 m)   Wt 178 lb 6.4 oz (80.9 kg)   BMI 27.94 kg/m  Gen: NAD, alert, cooperative with exam HEENT: NCAT, PERRLA, EOMI CV: RRR, good S1/S2, no murmur Resp: CTABL, no wheezes, non-labored Ext: No edema, warm Neuro: Alert and oriented, No gross deficits  Assessment and plan:  # Retention We discussed the seriousness of his severely elevated blood pressure today, recommended going home and taking medications right away. Blood pressure log daily 1 week followed by nurse visit for blood pressure recheck, I will add amlodipine if blood pressure is not controlled at home. No chest pain, dyspnea, headache to indicate hypertensive emergency.  # Chronic DVT Patient with history of more than 2 DVTs Good medication compliance with Edwin [name] was Discussed CBC today, he would like to defer, given that he has not seen any bleeding I think this is reasonable.  # Healthcare maintenance Recommended colonoscopy, he declines Continue to discuss   Edwin SinkSam Armonie Staten, MD [facility] 03/30/2017, 11:55 AM

## 2017-04-06 ENCOUNTER — Ambulatory Visit (INDEPENDENT_AMBULATORY_CARE_PROVIDER_SITE_OTHER): Payer: Medicare HMO | Admitting: *Deleted

## 2017-04-06 VITALS — BP 108/71 | HR 106

## 2017-04-06 DIAGNOSIS — Z0130 Encounter for examination of blood pressure without abnormal findings: Secondary | ICD-10-CM

## 2017-04-06 NOTE — Progress Notes (Signed)
Pt here for BP check BP 108 71 P 106

## 2017-05-03 ENCOUNTER — Other Ambulatory Visit: Payer: Self-pay | Admitting: Family Medicine

## 2017-05-03 DIAGNOSIS — I10 Essential (primary) hypertension: Secondary | ICD-10-CM

## 2017-06-01 ENCOUNTER — Other Ambulatory Visit: Payer: Self-pay | Admitting: Family Medicine

## 2017-06-04 ENCOUNTER — Other Ambulatory Visit: Payer: Self-pay | Admitting: *Deleted

## 2017-06-04 NOTE — Telephone Encounter (Signed)
Pt was started on this 05/21/16 if not earlier per pharmacy

## 2017-06-07 MED ORDER — GNP PRENATAL 28-0.8 MG PO TABS: 1.0000 | ORAL_TABLET | Freq: Every day | ORAL | 4 refills | Status: DC

## 2017-07-30 ENCOUNTER — Other Ambulatory Visit: Payer: Self-pay | Admitting: Family Medicine

## 2017-07-30 DIAGNOSIS — I10 Essential (primary) hypertension: Secondary | ICD-10-CM

## 2017-08-05 ENCOUNTER — Ambulatory Visit (INDEPENDENT_AMBULATORY_CARE_PROVIDER_SITE_OTHER): Payer: Medicare HMO | Admitting: Family Medicine

## 2017-08-05 VITALS — BP 131/82 | HR 100 | Temp 97.1°F | Ht 67.0 in | Wt 170.4 lb

## 2017-08-05 DIAGNOSIS — R7301 Impaired fasting glucose: Secondary | ICD-10-CM | POA: Diagnosis not present

## 2017-08-05 DIAGNOSIS — G250 Essential tremor: Secondary | ICD-10-CM

## 2017-08-05 DIAGNOSIS — I10 Essential (primary) hypertension: Secondary | ICD-10-CM | POA: Diagnosis not present

## 2017-08-05 DIAGNOSIS — I82409 Acute embolism and thrombosis of unspecified deep veins of unspecified lower extremity: Secondary | ICD-10-CM | POA: Diagnosis not present

## 2017-08-05 NOTE — Patient Instructions (Signed)
Great to see you!  Come back in 6 months unless you need us sooner.    

## 2017-08-05 NOTE — Progress Notes (Signed)
   HPI  Patient presents today here for four-month follow-up.  Patient feels well and has no complaints. He does complain of a fine tremor in his right and sometimes left hand with activities. This happens occasionally and is not bad enough to start medication in his opinion.  He does have a history of or current DVT, he takes blood thinners twice daily as prescribed. He denies any bleeding.  He's been checking his blood pressure - Ranging 100-120/70's typically HR is in the 90s at times.  No CP or HA.   PMH: Smoking status noted ROS: Per HPI  Objective: BP 131/82   Pulse (!) 116   Temp (!) 97.1 F (36.2 C) (Oral)   Ht _0  (1.702 m)   Wt 170 lb 6.4 oz (77.3 kg)   BMI 26.69 kg/m  Gen: NAD, alert, cooperative with exam HEENT: NCAT, poor dentition CV: RRR, good S1/S2, no murmur Resp: CTABL, no wheezes, non-labored Ext: No edema, warm Neuro: Alert and oriented, No gross deficits  Assessment and plan:  # Recurrent DVT Doing well with eliquis, IVC in place Labs  # HTN Doing well, no changes, some readings in the 36I systolic but he handles it well without syms, no chnages.   # Essential tremor Offered med, we will hold off for now. Consider primidone.     Orders Placed This Encounter  Procedures  . CMP14+EGFR  . CBC with Differential/Platelet  . Lipid panel  . TSH     Laroy Apple, MD [facility] 08/05/2017, 12:15 PM

## 2017-08-06 LAB — CBC WITH DIFFERENTIAL/PLATELET
BASOS ABS: 0 10*3/uL (ref 0.0–0.2)
Basos: 1 %
EOS (ABSOLUTE): 0.2 10*3/uL (ref 0.0–0.4)
Eos: 3 %
HEMOGLOBIN: 15.1 g/dL (ref 13.0–17.7)
Hematocrit: 44.3 % (ref 37.5–51.0)
IMMATURE GRANS (ABS): 0 10*3/uL (ref 0.0–0.1)
IMMATURE GRANULOCYTES: 0 %
LYMPHS: 33 %
Lymphocytes Absolute: 2.4 10*3/uL (ref 0.7–3.1)
MCH: 29.5 pg (ref 26.6–33.0)
MCHC: 34.1 g/dL (ref 31.5–35.7)
MCV: 87 fL (ref 79–97)
MONOCYTES: 5 %
Monocytes Absolute: 0.3 10*3/uL (ref 0.1–0.9)
NEUTROS PCT: 58 %
Neutrophils Absolute: 4.2 10*3/uL (ref 1.4–7.0)
PLATELETS: 231 10*3/uL (ref 150–379)
RBC: 5.12 x10E6/uL (ref 4.14–5.80)
RDW: 14.9 % (ref 12.3–15.4)
WBC: 7.1 10*3/uL (ref 3.4–10.8)

## 2017-08-06 LAB — CMP14+EGFR
A/G RATIO: 1.6 (ref 1.2–2.2)
ALT: 5 IU/L (ref 0–44)
AST: 10 IU/L (ref 0–40)
Albumin: 4.6 g/dL (ref 3.6–4.8)
Alkaline Phosphatase: 70 IU/L (ref 39–117)
BILIRUBIN TOTAL: 0.9 mg/dL (ref 0.0–1.2)
BUN/Creatinine Ratio: 16 (ref 10–24)
BUN: 24 mg/dL (ref 8–27)
CHLORIDE: 99 mmol/L (ref 96–106)
CO2: 22 mmol/L (ref 20–29)
Calcium: 9.9 mg/dL (ref 8.6–10.2)
Creatinine, Ser: 1.5 mg/dL — ABNORMAL HIGH (ref 0.76–1.27)
GFR calc Af Amer: 55 mL/min/{1.73_m2} — ABNORMAL LOW (ref >59)
GFR calc non Af Amer: 47 mL/min/{1.73_m2} — ABNORMAL LOW (ref >59)
GLUCOSE: 173 mg/dL — AB (ref 65–99)
Globulin, Total: 2.9 g/dL (ref 1.5–4.5)
POTASSIUM: 3.5 mmol/L (ref 3.5–5.2)
Sodium: 139 mmol/L (ref 134–144)
Total Protein: 7.5 g/dL (ref 6.0–8.5)

## 2017-08-06 LAB — LIPID PANEL
CHOL/HDL RATIO: 7.5 ratio — AB (ref 0.0–5.0)
CHOLESTEROL TOTAL: 196 mg/dL (ref 100–199)
HDL: 26 mg/dL — ABNORMAL LOW (ref >39)
LDL Calculated: 120 mg/dL — ABNORMAL HIGH (ref 0–99)
TRIGLYCERIDES: 249 mg/dL — AB (ref 0–149)
VLDL CHOLESTEROL CAL: 50 mg/dL — AB (ref 5–40)

## 2017-08-06 LAB — TSH: TSH: 1.54 u[IU]/mL (ref 0.450–4.500)

## 2017-08-10 LAB — SPECIMEN STATUS REPORT

## 2017-08-10 LAB — HGB A1C W/O EAG: HEMOGLOBIN A1C: 5.8 % — AB (ref 4.8–5.6)

## 2017-10-27 ENCOUNTER — Other Ambulatory Visit: Payer: Self-pay | Admitting: Family Medicine

## 2017-11-26 ENCOUNTER — Other Ambulatory Visit: Payer: Self-pay | Admitting: Family Medicine

## 2017-12-02 ENCOUNTER — Encounter: Payer: Self-pay | Admitting: *Deleted

## 2017-12-02 ENCOUNTER — Ambulatory Visit (INDEPENDENT_AMBULATORY_CARE_PROVIDER_SITE_OTHER): Payer: Medicare HMO | Admitting: *Deleted

## 2017-12-02 VITALS — BP 138/74 | HR 101 | Ht 67.0 in | Wt 170.0 lb

## 2017-12-02 DIAGNOSIS — Z0000 Encounter for general adult medical examination without abnormal findings: Secondary | ICD-10-CM | POA: Diagnosis not present

## 2017-12-02 NOTE — Patient Instructions (Signed)
  [name] , Thank you for taking time to come for your Medicare Wellness Visit. I appreciate your ongoing commitment to your health goals. Please review the following plan we discussed and let me know if I can assist you in the future.   These are the goals we discussed: Goals    . Exercise 150 min/wk Moderate Activity       This is a list of the screening recommended for you and due dates:  Health Maintenance  Topic Date Due  . Colon Cancer Screening  12/02/2018*  . Tetanus Vaccine  12/02/2018*  . Pneumonia vaccines (1 of 2 - PCV13) 12/02/2018*  .  Hepatitis C: One time screening is recommended by Center for Disease Control  (CDC) for  adults born from [birth year] through 1965.   Completed  . Flu Shot  Discontinued  *Topic was postponed. The date shown is not the original due date.

## 2017-12-02 NOTE — Progress Notes (Signed)
Subjective:   Edwin [name] is a 67 y.o. male who presents for an Initial Medicare Annual Wellness Visit. [name] is single and lives in a ground floor apartment. He has one adult son who accompanies him today. He is retired from the [employer]. He was walking around his apartment for exercise but states that he "got good at it so he didn't think he needed and stopped."  Review of Systems  Health is about the same as last year  Cardiac Risk Factors include: advanced age (>94men, >46 women);sedentary lifestyle;smoking/ tobacco exposure;hypertension;dyslipidemia;family history of premature cardiovascular disease;male gender  Other systems negative   Objective:    Today's Vitals   12/02/17 1118  BP: 138/74  Pulse: (!) 101  Weight: 170 lb (77.1 kg)  Height: 5\' 7"  (1.702 m)   Body mass index is 26.63 kg/m.  Advanced Directives 12/02/2017 07/16/2015 07/09/2015 07/03/2015 06/17/2015 12/13/2014 11/22/2014  Does Patient Have a Medical Advance Directive? No Yes Yes Yes No;Yes No No  Type of Advance Directive - Customer service manager Power of [employer] Power of Attorney - -  Copy of Healthcare Power of Attorney in Chart? - - Yes Yes Yes - -  Would patient like information on creating a medical advance directive? No - Patient declined - - - - No - patient declined information No - patient declined information    Current Medications (verified) Outpatient Encounter Medications as of 12/02/2017  Medication Sig  . Cholecalciferol (VITAMIN D3) 2000 UNITS TABS Take 1 tablet by mouth daily.  . clonazePAM (KLONOPIN) 0.5 MG tablet TAKE 1 TABLET ONCE A DAY  . cyanocobalamin (,VITAMIN B-12,) 1000 MCG/ML injection Inject 1 mL (1,000 mcg total) into the muscle once a week.  Marland Kitchen ELIQUIS 5 MG TABS tablet TAKE (1) TABLET TWICE A DAY.  . folic acid (FOLVITE) 1 MG tablet TAKE 1 TABLET ONCE A DAY  . folic acid (FOLVITE) 1 MG tablet TAKE 1 TABLET ONCE A DAY    . furosemide (LASIX) 20 MG tablet TAKE (1) TABLET DAILY AS DIRECTED.  Marland Kitchen lisinopril-hydrochlorothiazide (PRINZIDE,ZESTORETIC) 20-12.5 MG tablet TAKE 1 TABLET DAILY  . pantoprazole (PROTONIX) 40 MG tablet TAKE 1 TABLET DAILY  . Prenatal Vit-Fe Fumarate-FA (GNP PRENATAL) 28-0.8 MG TABS TAKE 1 TABLET ONCE DAILY AT NOON  . tamsulosin (FLOMAX) 0.4 MG CAPS capsule TAKE (1) CAPSULE DAILY  . HYDROcodone-acetaminophen (NORCO/VICODIN) 5-325 MG per tablet Take 1 tablet by mouth every 6 (six) hours as needed for moderate pain. (Patient not taking: Reported on 12/02/2017)  . lisinopril-hydrochlorothiazide (PRINZIDE,ZESTORETIC) 20-12.5 MG tablet TAKE 1 TABLET DAILY   No facility-administered encounter medications on file as of 12/02/2017.     Allergies (verified) Patient has no known allergies.   History: Past Medical History:  Diagnosis Date  . Acute encephalopathy 08/14/2014  . Anemia    nutritional anemia  . Anemia    posthemorrhagic anemia  . Bladder calculus 10/06/2014  . Blood dyscrasia    pancytopenia  . BPH (benign prostatic hyperplasia) 01/31/2015  . DVT (deep venous thrombosis) (HCC) [id number]   left leg  . Dysphagia 10/11/14  . Encephalopathy   . Essential hypertension 01/31/2015  . Failure to thrive in adult 08/22/14  . Hx of sepsis 12/15  . Megaloblastic anemia due to B12 deficiency 08/15/2014  . Neuromuscular disorder (HCC)    muscle weakness  . Pancytopenia (HCC) 08/14/2014  . Peripheral vascular disease (HCC) 10/11/14   acute embolism and thrombosis Rt femoral vein  .  Renal disorder    Bladder stone,Bladder diverticulm  . Right leg DVT (HCC) 08/14/2014   R. femoral vein  . S/P IVC filter 10/05/2014   Sec to hematuria on Eliquis  . Sepsis (HCC) 10/05/2014  . UTI (lower urinary tract infection)    Past Surgical History:  Procedure Laterality Date  . CYSTOSCOPY N/A 08/21/2014   Procedure: CYSTOSCOPY FLEXIBLE;  Surgeon: Chelsea Aus, MD;  Location: [facility];  Service:  Urology;  Laterality: N/A;  at bedside  . CYSTOSCOPY WITH LITHOLAPAXY N/A 11/22/2014   Procedure: CYSTOSCOPY WITH LITHOLAPAXY;  Surgeon: Chelsea Aus, MD;  Location: [facility];  Service: Urology;  Laterality: N/A;  . TRANSURETHRAL INCISION OF PROSTATE N/A 11/22/2014   Procedure: TRANSURETHRAL INCISION OF THE PROSTATE (TUIP);  Surgeon: Chelsea Aus, MD;  Location: [facility];  Service: Urology;  Laterality: N/A;   Family History  Problem Relation Age of Onset  . Alzheimer's disease Mother 61  . Congenital Murmur Father   . Cancer Sister    Social History   Socioeconomic History  . Marital status: Single    Spouse name: Not on file  . Number of children: 1  . Years of education: 42  . Highest education level: High school graduate  Social Needs  . Financial resource strain: Not hard at all  . Food insecurity - worry: Never true  . Food insecurity - inability: Never true  . Transportation needs - medical: No  . Transportation needs - non-medical: No  Occupational History  . Occupation: Retired    Comment: Designer, fashion/clothing  Tobacco Use  . Smoking status: Light Tobacco Smoker    Types: Cigars  . Smokeless tobacco: Never Used  Substance and Sexual Activity  . Alcohol use: No    Alcohol/week: 0.0 oz  . Drug use: No  . Sexual activity: No  Other Topics Concern  . Not on file  Social History Narrative   Patient lives alone in a ground floor apartment. He is retired from the [employer]. He has one son.        Tobacco Counseling Smokes 2 cigars a day. Not interested in quitting.   Clinical Intake:   Pain : No/denies pain    Nutritional Status: BMI of 19-24  Normal Diabetes: No  How often do you need to have someone help you when you read instructions, pamphlets, or other written materials from your doctor or pharmacy?: 1 - Never What is the last grade level you completed in school?: 12th    Information entered by :: Demetrios Loll, RN  Activities of Daily Living In  your present state of health, do you have any difficulty performing the following activities: 12/02/2017  Hearing? N  Vision? N  Comment Last eye exam was last year with Despina Arias, OD  Difficulty concentrating or making decisions? N  Walking or climbing stairs? N  Dressing or bathing? N  Doing errands, shopping? N  Preparing Food and eating ? N  Using the Toilet? N  In the past six months, have you accidently leaked urine? N  Do you have problems with loss of bowel control? N  Managing your Medications? N  Managing your Finances? N  Housekeeping or managing your Housekeeping? N  Some recent data might be hidden     Immunizations and Health Maintenance  There is no immunization history on file for this patient. There are no preventive care reminders to display for this patient.  Patient Care Team: Elenora Gamma, MD as  PCP - General (Family Medicine) Marcine Matarahlstedt, Stephen, MD as Consulting Physician (Urology) Beryle Beamsoonquah, Kofi, MD as Consulting Physician (Neurology)    No hospitalizations, ER visits, or surgeries this past year.   Assessment:   This is a routine wellness examination for Theodoro.  Hearing/Vision screen No deficits noted during visit. Eye exam is up to date   Dietary issues and exercise activities discussed: Current Exercise Habits: The patient does not participate in regular exercise at present, Exercise limited by: None identified  Diet: Two meals a day. Breakfast and supper. Eats when he is hungry.   Goals    . Exercise 150 min/wk Moderate Activity      Depression Screen PHQ 2/9 Scores 12/02/2017 08/05/2017 03/30/2017 11/18/2016  PHQ - 2 Score 0 0 0 0    Fall Risk Fall Risk  12/02/2017 08/05/2017 03/30/2017 11/18/2016 05/28/2016  Falls in the past year? No No No Yes No  Number falls in past yr: - - - - -  Injury with Fall? - - - - -  Risk for fall due to : - - - - -    Is the patient's home free of loose throw rugs in walkways, pet beds, electrical cords, etc?    yes      Grab bars in the bathroom? no      Handrails on the stairs?   yes      Adequate lighting?   yes   Cognitive Function: MMSE - Mini Mental State Exam 12/02/2017  Orientation to time 5  Orientation to Place 5  Registration 3  Attention/ Calculation 5  Recall 3  Language- name 2 objects 2  Language- repeat 1  Language- follow 3 step command 2  Language- read & follow direction 1  Write a sentence 1  Copy design 1  Total score 29    normal exam    Screening Tests Health Maintenance  Topic Date Due  . COLONOSCOPY  12/02/2018 (Originally 07/02/1999)  . TETANUS/TDAP  12/02/2018 (Originally 07/01/1968)  . PNA vac Low Risk Adult (1 of 2 - PCV13) 12/02/2018 (Originally 07/01/2014)  . Hepatitis C Screening  Completed  . INFLUENZA VACCINE  Discontinued  Declined vaccinations and colon cancers screening    Plan:  Keep f/u with PCP Increase activity level slowly. Start with walking for 5 to 10 minutes a day with a goal of 150 min a week. Quit smoking Consider colon cancer screening Consider vaccinations  I have personally reviewed and noted the following in the patient's chart:   . Medical and social history . Use of alcohol, tobacco or illicit drugs  . Current medications and supplements . Functional ability and status . Nutritional status . Physical activity . Advanced directives . List of other physicians . Hospitalizations, surgeries, and ER visits in previous 12 months . Vitals . Screenings to include cognitive, depression, and falls . Referrals and appointments  In addition, I have reviewed and discussed with patient certain preventive protocols, quality metrics, and best practice recommendations. A written personalized care plan for preventive services as well as general preventive health recommendations were provided to patient.     Demetrios LollKristen Soley Harriss, RN   12/02/2017    I have reviewed and agree with the above AWV documentation.   Murtis SinkSam Bradshaw, MD [facility] 12/02/2017, 5:34 PM

## 2017-12-29 ENCOUNTER — Other Ambulatory Visit: Payer: Self-pay | Admitting: Family Medicine

## 2017-12-29 NOTE — Telephone Encounter (Signed)
last seen 08/05/17  Dr Ermalinda MemosBradshaw

## 2018-01-26 ENCOUNTER — Other Ambulatory Visit: Payer: Self-pay | Admitting: Family Medicine

## 2018-01-26 NOTE — Telephone Encounter (Signed)
OV 02/03/18 

## 2018-01-27 DIAGNOSIS — D649 Anemia, unspecified: Secondary | ICD-10-CM | POA: Diagnosis not present

## 2018-01-27 DIAGNOSIS — J309 Allergic rhinitis, unspecified: Secondary | ICD-10-CM | POA: Diagnosis not present

## 2018-01-27 DIAGNOSIS — G4700 Insomnia, unspecified: Secondary | ICD-10-CM | POA: Diagnosis not present

## 2018-01-27 DIAGNOSIS — R69 Illness, unspecified: Secondary | ICD-10-CM | POA: Diagnosis not present

## 2018-01-27 DIAGNOSIS — E559 Vitamin D deficiency, unspecified: Secondary | ICD-10-CM | POA: Diagnosis not present

## 2018-01-27 DIAGNOSIS — K08409 Partial loss of teeth, unspecified cause, unspecified class: Secondary | ICD-10-CM | POA: Diagnosis not present

## 2018-01-27 DIAGNOSIS — K219 Gastro-esophageal reflux disease without esophagitis: Secondary | ICD-10-CM | POA: Diagnosis not present

## 2018-01-27 DIAGNOSIS — I10 Essential (primary) hypertension: Secondary | ICD-10-CM | POA: Diagnosis not present

## 2018-01-27 DIAGNOSIS — G252 Other specified forms of tremor: Secondary | ICD-10-CM | POA: Diagnosis not present

## 2018-02-03 ENCOUNTER — Encounter: Payer: Self-pay | Admitting: Family Medicine

## 2018-02-03 ENCOUNTER — Ambulatory Visit (INDEPENDENT_AMBULATORY_CARE_PROVIDER_SITE_OTHER): Payer: Medicare HMO | Admitting: Family Medicine

## 2018-02-03 VITALS — BP 131/84 | HR 103 | Temp 97.2°F | Ht 67.0 in | Wt 171.4 lb

## 2018-02-03 DIAGNOSIS — R7303 Prediabetes: Secondary | ICD-10-CM | POA: Diagnosis not present

## 2018-02-03 DIAGNOSIS — I10 Essential (primary) hypertension: Secondary | ICD-10-CM | POA: Diagnosis not present

## 2018-02-03 DIAGNOSIS — I82409 Acute embolism and thrombosis of unspecified deep veins of unspecified lower extremity: Secondary | ICD-10-CM

## 2018-02-03 LAB — BAYER DCA HB A1C WAIVED: HB A1C (BAYER DCA - WAIVED): 5.6 % (ref <7.0)

## 2018-02-03 NOTE — Patient Instructions (Signed)
Great to see you!  Come back in 6 months   

## 2018-02-03 NOTE — Progress Notes (Signed)
   HPI  Patient presents today follow-up chronic medical conditions.  Prediabetes Patient is aware of the diagnosis , This was a new diagnosis last visit.  Hypertension Good medication compliance and tolerance No headache or chest pain.  Patient has history of recurrent DVT-taking Eliquis twice daily  Patient also states that he has been taking Klonopin once daily, he does not readily understand with the medication is used for.  PMH: Smoking status noted ROS: Per HPI  Objective: BP 131/84   Pulse (!) 103   Temp (!) 97.2 F (36.2 C) (Oral)   Ht 5' 7" (1.702 m)   Wt 171 lb 6.4 oz (77.7 kg)   BMI 26.85 kg/m  Gen: NAD, alert, cooperative with exam HEENT: NCAT CV: RRR, good S1/S2, no murmur Resp: CTABL, no wheezes, non-labored Ext: No edema, warm Neuro: Alert and oriented, No gross deficits  Assessment and plan:  #Prediabetes Repeat A1c Continue lifestyle changes, patient is currently only aware of the diagnosis and not making dramatic lifestyle changes  #Hypertension Well-controlled on Prinzide, no changes  #Recurrent DVT Continue Eliquis, CBC  #Anxiety Patient states that his been taking Klonopin daily, he is very sure that he is taking the medication He will return for medication revie with 1 of our RNs This has not been prescribed since March 2018, 1 year ago.  I have checked the [place] database and also verified with the pharmacy, this has not been prescribed, filled, or given to the patient and that time.  His last refill was 01/04/2017. Patient does not recognize with the medication was for, he does not endorse severe anxiety, if he has not been on this for 1 year I would not recommend restarting it. However if he has been taking his daily I will gladly refill of medication, however I would like to understand how he has    Orders Placed This Encounter  Procedures  . CBC with Differential/Platelet  . CMP14+EGFR  . Lipid panel  . Bayer DCA Hb A1c  Waived     Sam Bradshaw, MD [facility] 02/03/2018, 11:21 AM     

## 2018-02-04 ENCOUNTER — Ambulatory Visit: Payer: Medicare HMO | Admitting: *Deleted

## 2018-02-04 LAB — CBC WITH DIFFERENTIAL/PLATELET
BASOS: 0 %
Basophils Absolute: 0 10*3/uL (ref 0.0–0.2)
EOS (ABSOLUTE): 0.2 10*3/uL (ref 0.0–0.4)
EOS: 3 %
HEMATOCRIT: 44.6 % (ref 37.5–51.0)
Hemoglobin: 14.4 g/dL (ref 13.0–17.7)
IMMATURE GRANS (ABS): 0 10*3/uL (ref 0.0–0.1)
Immature Granulocytes: 0 %
LYMPHS: 28 %
Lymphocytes Absolute: 2 10*3/uL (ref 0.7–3.1)
MCH: 29.2 pg (ref 26.6–33.0)
MCHC: 32.3 g/dL (ref 31.5–35.7)
MCV: 91 fL (ref 79–97)
MONOCYTES: 4 %
Monocytes Absolute: 0.3 10*3/uL (ref 0.1–0.9)
NEUTROS PCT: 65 %
Neutrophils Absolute: 4.5 10*3/uL (ref 1.4–7.0)
Platelets: 249 10*3/uL (ref 150–379)
RBC: 4.93 x10E6/uL (ref 4.14–5.80)
RDW: 14.5 % (ref 12.3–15.4)
WBC: 7 10*3/uL (ref 3.4–10.8)

## 2018-02-04 LAB — CMP14+EGFR
A/G RATIO: 1.8 (ref 1.2–2.2)
ALT: 10 IU/L (ref 0–44)
AST: 17 IU/L (ref 0–40)
Albumin: 4.6 g/dL (ref 3.6–4.8)
Alkaline Phosphatase: 62 IU/L (ref 39–117)
BUN/Creatinine Ratio: 19 (ref 10–24)
BUN: 23 mg/dL (ref 8–27)
Bilirubin Total: 0.6 mg/dL (ref 0.0–1.2)
CALCIUM: 9.7 mg/dL (ref 8.6–10.2)
CO2: 25 mmol/L (ref 20–29)
CREATININE: 1.18 mg/dL (ref 0.76–1.27)
Chloride: 103 mmol/L (ref 96–106)
GFR, EST AFRICAN AMERICAN: 73 mL/min/{1.73_m2} (ref >59)
GFR, EST NON AFRICAN AMERICAN: 63 mL/min/{1.73_m2} (ref >59)
GLOBULIN, TOTAL: 2.6 g/dL (ref 1.5–4.5)
Glucose: 135 mg/dL — ABNORMAL HIGH (ref 65–99)
POTASSIUM: 3.6 mmol/L (ref 3.5–5.2)
Sodium: 146 mmol/L — ABNORMAL HIGH (ref 134–144)
TOTAL PROTEIN: 7.2 g/dL (ref 6.0–8.5)

## 2018-02-04 LAB — LIPID PANEL
CHOL/HDL RATIO: 6.6 ratio — AB (ref 0.0–5.0)
Cholesterol, Total: 185 mg/dL (ref 100–199)
HDL: 28 mg/dL — AB (ref >39)
LDL CALC: 109 mg/dL — AB (ref 0–99)
Triglycerides: 241 mg/dL — ABNORMAL HIGH (ref 0–149)
VLDL Cholesterol Cal: 48 mg/dL — ABNORMAL HIGH (ref 5–40)

## 2018-02-04 MED ORDER — CLONAZEPAM 0.5 MG PO TABS: 0.5000 mg | ORAL_TABLET | Freq: Every day | ORAL | 0 refills | Status: DC

## 2018-02-04 NOTE — Progress Notes (Signed)
Medications reviewed with pt Per Dr Ermalinda MemosBradshaw Clonazepam refill ok x 1 mth RX [facility]  [facility]

## 2018-03-30 ENCOUNTER — Other Ambulatory Visit: Payer: Self-pay | Admitting: Family Medicine

## 2018-05-26 ENCOUNTER — Other Ambulatory Visit: Payer: Self-pay | Admitting: Family Medicine

## 2018-05-26 NOTE — Telephone Encounter (Signed)
Last seen 02/03/18, last filled 03/31/18. Edwin [name] pt

## 2018-06-28 ENCOUNTER — Other Ambulatory Visit: Payer: Self-pay | Admitting: Family

## 2018-08-03 ENCOUNTER — Other Ambulatory Visit: Payer: Self-pay | Admitting: Family Medicine

## 2018-08-03 NOTE — Telephone Encounter (Signed)
Patient has appointment on 10/11

## 2018-08-03 NOTE — Telephone Encounter (Signed)
Last seen 02/03/18  Dr Ermalinda Memos

## 2018-08-03 NOTE — Telephone Encounter (Signed)
This patient needs to be seen by a new provider.  We can do one refill on this medicine but he has to come in for future refills.

## 2018-08-05 ENCOUNTER — Ambulatory Visit: Payer: Medicare HMO | Admitting: Family Medicine

## 2018-08-17 ENCOUNTER — Encounter: Payer: Self-pay | Admitting: Family Medicine

## 2018-08-17 ENCOUNTER — Ambulatory Visit (INDEPENDENT_AMBULATORY_CARE_PROVIDER_SITE_OTHER): Payer: Medicare HMO | Admitting: Family Medicine

## 2018-08-17 ENCOUNTER — Encounter (INDEPENDENT_AMBULATORY_CARE_PROVIDER_SITE_OTHER): Payer: Self-pay

## 2018-08-17 VITALS — BP 127/83 | HR 100 | Temp 96.8°F | Ht 67.0 in | Wt 171.2 lb

## 2018-08-17 DIAGNOSIS — F411 Generalized anxiety disorder: Secondary | ICD-10-CM

## 2018-08-17 DIAGNOSIS — R69 Illness, unspecified: Secondary | ICD-10-CM | POA: Diagnosis not present

## 2018-08-17 DIAGNOSIS — R7303 Prediabetes: Secondary | ICD-10-CM

## 2018-08-17 DIAGNOSIS — I10 Essential (primary) hypertension: Secondary | ICD-10-CM | POA: Diagnosis not present

## 2018-08-17 DIAGNOSIS — R7989 Other specified abnormal findings of blood chemistry: Secondary | ICD-10-CM | POA: Diagnosis not present

## 2018-08-17 LAB — BAYER DCA HB A1C WAIVED: HB A1C: 5.7 % (ref <7.0)

## 2018-08-17 MED ORDER — LISINOPRIL-HYDROCHLOROTHIAZIDE 20-12.5 MG PO TABS: 1.0000 | ORAL_TABLET | Freq: Every day | ORAL | 3 refills | Status: DC

## 2018-08-17 MED ORDER — CLONAZEPAM 0.5 MG PO TABS: 0.5000 mg | ORAL_TABLET | Freq: Every day | ORAL | 2 refills | Status: DC

## 2018-08-17 MED ORDER — APIXABAN 5 MG PO TABS: ORAL_TABLET | ORAL | 3 refills | Status: DC

## 2018-08-17 MED ORDER — FUROSEMIDE 20 MG PO TABS: ORAL_TABLET | ORAL | 3 refills | Status: DC

## 2018-08-17 MED ORDER — TAMSULOSIN HCL 0.4 MG PO CAPS: ORAL_CAPSULE | ORAL | 3 refills | Status: DC

## 2018-08-17 MED ORDER — PANTOPRAZOLE SODIUM 40 MG PO TBEC: 40.0000 mg | DELAYED_RELEASE_TABLET | Freq: Every day | ORAL | 3 refills | Status: DC

## 2018-08-17 NOTE — Progress Notes (Signed)
BP 127/83   Pulse 100   Temp (!) 96.8 F (36 C) (Oral)   Ht 5' 7" (1.702 m)   Wt 171 lb 3.2 oz (77.7 kg)   BMI 26.81 kg/m    Subjective:    Patient ID: Edwin [name], male    DOB: [birth year], 67 y.o.   MRN: [id number]  HPI: Edwin [name] is a 67 y.o. male presenting on 08/17/2018 for Hypertension (6 month follow up) and Establish Care Wendi Snipes pt)   HPI Hypertension Patient is currently on lisinopril-hydrochlorothiazide, and their blood pressure today is 127/83. Patient denies any lightheadedness or dizziness. Patient denies headaches, blurred vision, chest pains, shortness of breath, or weakness. Denies any side effects from medication and is content with current medication.   Prediabetes Patient comes in today for recheck of his diabetes. Patient has been currently taking no medications for diabetes and has been trying diet control and just monitoring for now and has been mostly controlled. Patient is currently on an ACE inhibitor/ARB. Patient has not seen an ophthalmologist this year. Patient denies any issues with their feet.   Anxiety Patient is also been seen Dr. Wendi Snipes for anxiety and has been taking clonazepam and has been staying stable on it he takes 0.5 mg by mouth daily.  He denies any major depression or suicidal ideations or thoughts of hurting himself.  Relevant past medical, surgical, family and social history reviewed and updated as indicated. Interim medical history since our last visit reviewed. Allergies and medications reviewed and updated.  Review of Systems  Constitutional: Negative for chills and fever.  Eyes: Negative for visual disturbance.  Respiratory: Negative for shortness of breath and wheezing.   Cardiovascular: Negative for chest pain and leg swelling.  Musculoskeletal: Negative for back pain and gait problem.  Skin: Negative for rash.  Neurological: Negative for dizziness, seizures, weakness and numbness.  Psychiatric/Behavioral: Negative for  decreased concentration, dysphoric mood, self-injury, sleep disturbance and suicidal ideas. The patient is nervous/anxious.   All other systems reviewed and are negative.  Per HPI unless specifically indicated above   Allergies as of 08/17/2018   No Known Allergies     Medication List        Accurate as of 08/17/18 11:42 AM. Always use your most recent med list.          apixaban 5 MG Tabs tablet Commonly known as:  ELIQUIS TAKE (1) TABLET TWICE A DAY.   clonazePAM 0.5 MG tablet Commonly known as:  KLONOPIN Take 1 tablet (0.5 mg total) by mouth daily. Start taking on:  09/02/2018   cyanocobalamin 1000 MCG/ML injection Commonly known as:  (VITAMIN B-12) Inject 1 mL (1,000 mcg total) into the muscle once a week.   folic acid 1 MG tablet Commonly known as:  FOLVITE TAKE 1 TABLET ONCE A DAY   furosemide 20 MG tablet Commonly known as:  LASIX TAKE (1) TABLET DAILY AS DIRECTED.   GNP PRENATAL 28-0.8 MG Tabs TAKE 1 TABLET ONCE DAILY AT NOON   lisinopril-hydrochlorothiazide 20-12.5 MG tablet Commonly known as:  PRINZIDE,ZESTORETIC Take 1 tablet by mouth daily.   pantoprazole 40 MG tablet Commonly known as:  PROTONIX Take 1 tablet (40 mg total) by mouth daily.   tamsulosin 0.4 MG Caps capsule Commonly known as:  FLOMAX TAKE (1) CAPSULE DAILY   Vitamin D3 2000 units Tabs Take 1 tablet by mouth daily.          Objective:    BP 127/83   Pulse 100  Temp (!) 96.8 F (36 C) (Oral)   Ht 5' 7" (1.702 m)   Wt 171 lb 3.2 oz (77.7 kg)   BMI 26.81 kg/m   Wt Readings from Last 3 Encounters:  08/17/18 171 lb 3.2 oz (77.7 kg)  02/03/18 171 lb 6.4 oz (77.7 kg)  12/02/17 170 lb (77.1 kg)    Physical Exam  Constitutional: He is oriented to person, place, and time. He appears well-developed and well-nourished. No distress.  Eyes: Conjunctivae are normal. No scleral icterus.  Neck: Neck supple. No thyromegaly present.  Cardiovascular: Normal rate, regular rhythm,  normal heart sounds and intact distal pulses.  No murmur heard. Pulmonary/Chest: Effort normal and breath sounds normal. No respiratory distress. He has no wheezes.  Lymphadenopathy:    He has no cervical adenopathy.  Neurological: He is alert and oriented to person, place, and time. Coordination normal.  Skin: Skin is warm and dry. No rash noted. He is not diaphoretic.  Psychiatric: His behavior is normal. His mood appears anxious. He does not exhibit a depressed mood. He expresses no suicidal ideation. He expresses no suicidal plans.  Nursing note and vitals reviewed.       Assessment & Plan:   Problem List Items Addressed This Visit      Cardiovascular and Mediastinum   Essential hypertension - Primary   Relevant Medications   lisinopril-hydrochlorothiazide (PRINZIDE,ZESTORETIC) 20-12.5 MG tablet   apixaban (ELIQUIS) 5 MG TABS tablet   furosemide (LASIX) 20 MG tablet   atorvastatin (LIPITOR) 20 MG tablet   Other Relevant Orders   CMP14+EGFR (Completed)   CBC with Differential/Platelet (Completed)   Lipid panel (Completed)     Other   Generalized anxiety disorder   Relevant Orders   CBC with Differential/Platelet (Completed)   Prediabetes   Relevant Orders   Bayer DCA Hb A1c Waived (Completed)   CMP14+EGFR (Completed)   CBC with Differential/Platelet (Completed)   Lipid panel (Completed)    Other Visit Diagnoses    Elevated serum creatinine       Relevant Orders   BMP8+EGFR       Follow up plan: Return in about 3 months (around 11/17/2018), or if symptoms worsen or fail to improve, for Hypertension and prediabetes.  Counseling provided for all of the vaccine components Orders Placed This Encounter  Procedures  . Bayer DCA Hb A1c Waived  . CMP14+EGFR  . CBC with Differential/Platelet  . Lipid panel    Caryl Pina, MD [facility] 08/17/2018, 11:42 AM

## 2018-08-18 LAB — CMP14+EGFR
ALBUMIN: 4.7 g/dL (ref 3.6–4.8)
ALK PHOS: 62 IU/L (ref 39–117)
ALT: 13 IU/L (ref 0–44)
AST: 19 IU/L (ref 0–40)
Albumin/Globulin Ratio: 2 (ref 1.2–2.2)
BILIRUBIN TOTAL: 0.6 mg/dL (ref 0.0–1.2)
BUN/Creatinine Ratio: 22 (ref 10–24)
BUN: 30 mg/dL — AB (ref 8–27)
CHLORIDE: 99 mmol/L (ref 96–106)
CO2: 26 mmol/L (ref 20–29)
Calcium: 9.9 mg/dL (ref 8.6–10.2)
Creatinine, Ser: 1.37 mg/dL — ABNORMAL HIGH (ref 0.76–1.27)
GFR calc Af Amer: 60 mL/min/{1.73_m2} (ref >59)
GFR calc non Af Amer: 52 mL/min/{1.73_m2} — ABNORMAL LOW (ref >59)
GLUCOSE: 114 mg/dL — AB (ref 65–99)
Globulin, Total: 2.4 g/dL (ref 1.5–4.5)
Potassium: 3.7 mmol/L (ref 3.5–5.2)
Sodium: 142 mmol/L (ref 134–144)
TOTAL PROTEIN: 7.1 g/dL (ref 6.0–8.5)

## 2018-08-18 LAB — CBC WITH DIFFERENTIAL/PLATELET
BASOS ABS: 0.1 10*3/uL (ref 0.0–0.2)
BASOS: 1 %
EOS (ABSOLUTE): 0.3 10*3/uL (ref 0.0–0.4)
Eos: 4 %
HEMOGLOBIN: 13.8 g/dL (ref 13.0–17.7)
Hematocrit: 40.9 % (ref 37.5–51.0)
IMMATURE GRANS (ABS): 0 10*3/uL (ref 0.0–0.1)
Immature Granulocytes: 0 %
LYMPHS: 29 %
Lymphocytes Absolute: 2.1 10*3/uL (ref 0.7–3.1)
MCH: 30.2 pg (ref 26.6–33.0)
MCHC: 33.7 g/dL (ref 31.5–35.7)
MCV: 90 fL (ref 79–97)
MONOCYTES: 5 %
Monocytes Absolute: 0.4 10*3/uL (ref 0.1–0.9)
NEUTROS ABS: 4.4 10*3/uL (ref 1.4–7.0)
NEUTROS PCT: 61 %
Platelets: 227 10*3/uL (ref 150–450)
RBC: 4.57 x10E6/uL (ref 4.14–5.80)
RDW: 13.5 % (ref 12.3–15.4)
WBC: 7.2 10*3/uL (ref 3.4–10.8)

## 2018-08-18 LAB — LIPID PANEL
CHOLESTEROL TOTAL: 204 mg/dL — AB (ref 100–199)
Chol/HDL Ratio: 7.3 ratio — ABNORMAL HIGH (ref 0.0–5.0)
HDL: 28 mg/dL — ABNORMAL LOW (ref >39)
LDL CALC: 137 mg/dL — AB (ref 0–99)
Triglycerides: 194 mg/dL — ABNORMAL HIGH (ref 0–149)
VLDL Cholesterol Cal: 39 mg/dL (ref 5–40)

## 2018-08-18 MED ORDER — ATORVASTATIN CALCIUM 20 MG PO TABS: 20.0000 mg | ORAL_TABLET | Freq: Every day | ORAL | 1 refills | Status: DC

## 2018-08-25 ENCOUNTER — Other Ambulatory Visit: Payer: Medicare HMO

## 2018-08-25 DIAGNOSIS — R7989 Other specified abnormal findings of blood chemistry: Secondary | ICD-10-CM

## 2018-08-25 LAB — BMP8+EGFR
BUN/Creatinine Ratio: 12 (ref 10–24)
BUN: 17 mg/dL (ref 8–27)
CALCIUM: 10.1 mg/dL (ref 8.6–10.2)
CO2: 22 mmol/L (ref 20–29)
CREATININE: 1.43 mg/dL — AB (ref 0.76–1.27)
Chloride: 98 mmol/L (ref 96–106)
GFR calc Af Amer: 57 mL/min/{1.73_m2} — ABNORMAL LOW (ref >59)
GFR calc non Af Amer: 50 mL/min/{1.73_m2} — ABNORMAL LOW (ref >59)
Glucose: 104 mg/dL — ABNORMAL HIGH (ref 65–99)
Potassium: 3.8 mmol/L (ref 3.5–5.2)
SODIUM: 140 mmol/L (ref 134–144)

## 2019-02-16 ENCOUNTER — Encounter: Payer: Self-pay | Admitting: Family Medicine

## 2019-02-16 ENCOUNTER — Other Ambulatory Visit: Payer: Self-pay

## 2019-02-16 ENCOUNTER — Ambulatory Visit (INDEPENDENT_AMBULATORY_CARE_PROVIDER_SITE_OTHER): Payer: Medicare HMO | Admitting: Family Medicine

## 2019-02-16 DIAGNOSIS — E782 Mixed hyperlipidemia: Secondary | ICD-10-CM

## 2019-02-16 DIAGNOSIS — R69 Illness, unspecified: Secondary | ICD-10-CM | POA: Diagnosis not present

## 2019-02-16 DIAGNOSIS — E785 Hyperlipidemia, unspecified: Secondary | ICD-10-CM | POA: Insufficient documentation

## 2019-02-16 DIAGNOSIS — F411 Generalized anxiety disorder: Secondary | ICD-10-CM

## 2019-02-16 DIAGNOSIS — I10 Essential (primary) hypertension: Secondary | ICD-10-CM

## 2019-02-16 DIAGNOSIS — R7303 Prediabetes: Secondary | ICD-10-CM

## 2019-02-16 MED ORDER — CLONAZEPAM 0.5 MG PO TABS: 0.5000 mg | ORAL_TABLET | Freq: Every day | ORAL | 2 refills | Status: DC

## 2019-02-16 NOTE — Progress Notes (Signed)
Virtual Visit via telephone Note  I connected with Edwin [name] on 02/16/19 at 1105 by telephone and verified that I am speaking with the correct person using two identifiers. Ishawn Mulroy is currently located at home and no other people are currently with her during visit. The provider, Elige Radon Vivan Agostino, MD is located in their office at time of visit.  Call ended at 1117  I discussed the limitations, risks, security and privacy concerns of performing an evaluation and management service by telephone and the availability of in person appointments. I also discussed with the patient that there may be a patient responsible charge related to this service. The patient expressed understanding and agreed to proceed.   History and Present Illness: Hypertension Patient is currently on lisinopril-hydrochlorothiazide, and their blood pressure today is 140 systolic at home. Patient denies any lightheadedness or dizziness. Patient denies headaches, blurred vision, chest pains, shortness of breath, or weakness. Denies any side effects from medication and is content with current medication.   Prediabetes  Patient comes in today for recheck of his diabetes. Patient has been currently taking no medication currently. Patient is currently on an ACE inhibitor/ARB. Patient has not seen an ophthalmologist this year. Patient denies any issues with their feet.  Hyperlipidemia Patient is coming in for recheck of his hyperlipidemia. The patient is currently taking lipitor. They deny any issues with myalgias or history of liver damage from it. They deny any focal numbness or weakness or chest pain.    Anxiety Patient is checking up on anxiety and is doing well on the Klonopin.  He denies any suicidal ideations or thoughts of hurting herself.   No diagnosis found.  Outpatient Encounter Medications as of 02/16/2019  Medication Sig  . apixaban (ELIQUIS) 5 MG TABS tablet TAKE (1) TABLET TWICE A DAY.  Marland Kitchen atorvastatin  (LIPITOR) 20 MG tablet Take 1 tablet (20 mg total) by mouth daily.  . Cholecalciferol (VITAMIN D3) 2000 UNITS TABS Take 1 tablet by mouth daily.  . clonazePAM (KLONOPIN) 0.5 MG tablet Take 1 tablet (0.5 mg total) by mouth daily.  . cyanocobalamin (,VITAMIN B-12,) 1000 MCG/ML injection Inject 1 mL (1,000 mcg total) into the muscle once a week.  . folic acid (FOLVITE) 1 MG tablet TAKE 1 TABLET ONCE A DAY  . furosemide (LASIX) 20 MG tablet TAKE (1) TABLET DAILY AS DIRECTED.  Marland Kitchen lisinopril-hydrochlorothiazide (PRINZIDE,ZESTORETIC) 20-12.5 MG tablet Take 1 tablet by mouth daily.  . pantoprazole (PROTONIX) 40 MG tablet Take 1 tablet (40 mg total) by mouth daily.  . Prenatal Vit-Fe Fumarate-FA (GNP PRENATAL) 28-0.8 MG TABS TAKE 1 TABLET ONCE DAILY AT NOON  . tamsulosin (FLOMAX) 0.4 MG CAPS capsule TAKE (1) CAPSULE DAILY   No facility-administered encounter medications on file as of 02/16/2019.     Review of Systems  Constitutional: Negative for chills and fever.  Eyes: Negative for visual disturbance.  Respiratory: Negative for shortness of breath and wheezing.   Cardiovascular: Negative for chest pain and leg swelling.  Musculoskeletal: Negative for back pain and gait problem.  Skin: Negative for rash.  Psychiatric/Behavioral: Negative for decreased concentration, dysphoric mood, self-injury, sleep disturbance and suicidal ideas. The patient is not nervous/anxious.   All other systems reviewed and are negative.   Observations/Objective: Patient sounds comfortable and in no acute distress  Assessment and Plan: Problem List Items Addressed This Visit      Cardiovascular and Mediastinum   Essential hypertension - Primary     Other   Generalized anxiety  disorder   Relevant Medications   clonazePAM (KLONOPIN) 0.5 MG tablet   Prediabetes   Hyperlipidemia       Follow Up Instructions:  Recheck in 3 months  Refilled klonipin   I discussed the assessment and treatment plan with the  patient. The patient was provided an opportunity to ask questions and all were answered. The patient agreed with the plan and demonstrated an understanding of the instructions.   The patient was advised to call back or seek an in-person evaluation if the symptoms worsen or if the condition fails to improve as anticipated.  The above assessment and management plan was discussed with the patient. The patient verbalized understanding of and has agreed to the management plan. Patient is aware to call the clinic if symptoms persist or worsen. Patient is aware when to return to the clinic for a follow-up visit. Patient educated on when it is appropriate to go to the emergency department.    I provided 12 minutes of non-face-to-face time during this encounter.    Nils PyleJoshua A Halie Gass, MD

## 2019-03-22 ENCOUNTER — Other Ambulatory Visit: Payer: Self-pay | Admitting: Family Medicine

## 2019-04-04 DIAGNOSIS — E538 Deficiency of other specified B group vitamins: Secondary | ICD-10-CM | POA: Diagnosis not present

## 2019-04-04 DIAGNOSIS — I10 Essential (primary) hypertension: Secondary | ICD-10-CM | POA: Diagnosis not present

## 2019-04-04 DIAGNOSIS — K219 Gastro-esophageal reflux disease without esophagitis: Secondary | ICD-10-CM | POA: Diagnosis not present

## 2019-04-04 DIAGNOSIS — R609 Edema, unspecified: Secondary | ICD-10-CM | POA: Diagnosis not present

## 2019-04-04 DIAGNOSIS — E569 Vitamin deficiency, unspecified: Secondary | ICD-10-CM | POA: Diagnosis not present

## 2019-04-04 DIAGNOSIS — Z7901 Long term (current) use of anticoagulants: Secondary | ICD-10-CM | POA: Diagnosis not present

## 2019-04-04 DIAGNOSIS — Z7722 Contact with and (suspected) exposure to environmental tobacco smoke (acute) (chronic): Secondary | ICD-10-CM | POA: Diagnosis not present

## 2019-04-04 DIAGNOSIS — N400 Enlarged prostate without lower urinary tract symptoms: Secondary | ICD-10-CM | POA: Diagnosis not present

## 2019-04-04 DIAGNOSIS — R69 Illness, unspecified: Secondary | ICD-10-CM | POA: Diagnosis not present

## 2019-04-04 DIAGNOSIS — Z809 Family history of malignant neoplasm, unspecified: Secondary | ICD-10-CM | POA: Diagnosis not present

## 2019-06-16 ENCOUNTER — Other Ambulatory Visit: Payer: Self-pay | Admitting: Family Medicine

## 2019-06-16 DIAGNOSIS — F411 Generalized anxiety disorder: Secondary | ICD-10-CM

## 2019-06-21 ENCOUNTER — Other Ambulatory Visit: Payer: Self-pay | Admitting: Family Medicine

## 2019-06-21 NOTE — Telephone Encounter (Signed)
OV 01/2019

## 2019-07-18 ENCOUNTER — Other Ambulatory Visit: Payer: Self-pay | Admitting: Family Medicine

## 2019-07-18 DIAGNOSIS — F411 Generalized anxiety disorder: Secondary | ICD-10-CM

## 2019-10-03 ENCOUNTER — Ambulatory Visit (INDEPENDENT_AMBULATORY_CARE_PROVIDER_SITE_OTHER): Payer: Medicare HMO | Admitting: Family Medicine

## 2019-10-03 ENCOUNTER — Encounter: Payer: Self-pay | Admitting: Family Medicine

## 2019-10-03 DIAGNOSIS — I10 Essential (primary) hypertension: Secondary | ICD-10-CM

## 2019-10-03 DIAGNOSIS — R7303 Prediabetes: Secondary | ICD-10-CM | POA: Diagnosis not present

## 2019-10-03 DIAGNOSIS — D531 Other megaloblastic anemias, not elsewhere classified: Secondary | ICD-10-CM | POA: Diagnosis not present

## 2019-10-03 DIAGNOSIS — E559 Vitamin D deficiency, unspecified: Secondary | ICD-10-CM | POA: Diagnosis not present

## 2019-10-03 DIAGNOSIS — E782 Mixed hyperlipidemia: Secondary | ICD-10-CM

## 2019-10-03 MED ORDER — ATORVASTATIN CALCIUM 20 MG PO TABS: 20.0000 mg | ORAL_TABLET | Freq: Every day | ORAL | 3 refills | Status: DC

## 2019-10-03 NOTE — Progress Notes (Signed)
Virtual Visit via telephone Note  I connected with Edwin [name] on 10/03/19 at 1428 by telephone and verified that I am speaking with the correct person using two identifiers. Edwin [name] is currently located at home and no other people are currently with her during visit. The provider, Edwin Kaufmann Tallie Hevia, MD is located in their office at time of visit.  Call ended at 1442  I discussed the limitations, risks, security and privacy concerns of performing an evaluation and management service by telephone and the availability of in person appointments. I also discussed with the patient that there may be a patient responsible charge related to this service. The patient expressed understanding and agreed to proceed.   History and Present Illness: Prediabetes  Patient comes in today for recheck of his diabetes. Patient has been currently taking no medication and is diet controlled. Patient is currently on an ACE inhibitor/ARB. Patient has not seen an ophthalmologist this year. Patient denies any issues with their feet.   eliquis recheck Reason on anticoagulation: eliquis Patient denies any bruising or bleeding or chest pain or palpitations   Hypertension Patient is currently on lisinopril-hctz, and their blood pressure today is 120-140 systolid . Patient denies any lightheadedness or dizziness. Patient denies headaches, blurred vision, chest pains, shortness of breath, or weakness. Denies any side effects from medication and is content with current medication.   Hyperlipidemia Patient is coming in for recheck of his hyperlipidemia. The patient is currently taking atorvastatin. They deny any issues with myalgias or history of liver damage from it. They deny any focal numbness or weakness or chest pain.   Anxiety Patient has been off of clonazepam and thinks he doesn't need it, has been off for one month.  He denies any anxiety and is okay with just discontinuing the clonazepam  No diagnosis  found.  Outpatient Encounter Medications as of 10/03/2019  Medication Sig  . apixaban (ELIQUIS) 5 MG TABS tablet TAKE (1) TABLET TWICE A DAY.  Marland Kitchen atorvastatin (LIPITOR) 20 MG tablet TAKE 1 TABLET DAILY  . Cholecalciferol (VITAMIN D3) 2000 UNITS TABS Take 1 tablet by mouth daily.  . clonazePAM (KLONOPIN) 0.5 MG tablet Take 1 tablet (0.5 mg total) by mouth daily.  . cyanocobalamin (,VITAMIN B-12,) 1000 MCG/ML injection Inject 1 mL (1,000 mcg total) into the muscle once a week.  . folic acid (FOLVITE) 1 MG tablet TAKE 1 TABLET ONCE A DAY  . furosemide (LASIX) 20 MG tablet TAKE (1) TABLET DAILY AS DIRECTED.  Marland Kitchen lisinopril-hydrochlorothiazide (PRINZIDE,ZESTORETIC) 20-12.5 MG tablet Take 1 tablet by mouth daily.  . pantoprazole (PROTONIX) 40 MG tablet Take 1 tablet (40 mg total) by mouth daily.  . Prenatal Vit-Fe Fumarate-FA (GNP PRENATAL) 28-0.8 MG TABS TAKE 1 TABLET ONCE DAILY AT NOON  . tamsulosin (FLOMAX) 0.4 MG CAPS capsule TAKE (1) CAPSULE DAILY   No facility-administered encounter medications on file as of 10/03/2019.     Review of Systems  Constitutional: Negative for chills and fever.  Eyes: Negative for visual disturbance.  Respiratory: Negative for shortness of breath and wheezing.   Cardiovascular: Negative for chest pain and leg swelling.  Musculoskeletal: Negative for back pain and gait problem.  Skin: Negative for rash.  Neurological: Negative for dizziness, weakness and light-headedness.  All other systems reviewed and are negative.   Observations/Objective: Patient sounds comfortable and in no acute distress  Assessment and Plan: Problem List Items Addressed This Visit      Cardiovascular and Mediastinum   Essential hypertension  Relevant Medications   atorvastatin (LIPITOR) 20 MG tablet   Other Relevant Orders   CMP14+EGFR     Other   Megaloblastic anemia due to B12 deficiency   Relevant Orders   Vitamin B12   Folate   Vitamin D deficiency   Relevant Orders    Vitamin D 25 hydroxy   Prediabetes - Primary   Relevant Orders   CBC with Differential/Platelet   CMP14+EGFR   hgba1c   Hyperlipidemia   Relevant Medications   atorvastatin (LIPITOR) 20 MG tablet   Other Relevant Orders   Lipid panel       Follow Up Instructions:  Stop the folic acid and will check blood work for his blood counts and also discontinue the clonazepam but other than that keep his medications as is and will check his blood work.  Return in 6 months for routine visit and blood work   I discussed the assessment and treatment plan with the patient. The patient was provided an opportunity to ask questions and all were answered. The patient agreed with the plan and demonstrated an understanding of the instructions.   The patient was advised to call back or seek an in-person evaluation if the symptoms worsen or if the condition fails to improve as anticipated.  The above assessment and management plan was discussed with the patient. The patient verbalized understanding of and has agreed to the management plan. Patient is aware to call the clinic if symptoms persist or worsen. Patient is aware when to return to the clinic for a follow-up visit. Patient educated on when it is appropriate to go to the emergency department.    I provided 14 minutes of non-face-to-face time during this encounter.    Worthy Rancher, MD

## 2019-10-10 ENCOUNTER — Other Ambulatory Visit: Payer: Self-pay

## 2019-10-10 ENCOUNTER — Other Ambulatory Visit: Payer: Medicare HMO

## 2019-10-10 DIAGNOSIS — R7303 Prediabetes: Secondary | ICD-10-CM

## 2019-10-10 DIAGNOSIS — E559 Vitamin D deficiency, unspecified: Secondary | ICD-10-CM | POA: Diagnosis not present

## 2019-10-10 DIAGNOSIS — E782 Mixed hyperlipidemia: Secondary | ICD-10-CM

## 2019-10-10 DIAGNOSIS — D531 Other megaloblastic anemias, not elsewhere classified: Secondary | ICD-10-CM

## 2019-10-10 DIAGNOSIS — I10 Essential (primary) hypertension: Secondary | ICD-10-CM

## 2019-10-10 LAB — BAYER DCA HB A1C WAIVED: HB A1C (BAYER DCA - WAIVED): 5.6 % (ref <7.0)

## 2019-10-11 LAB — CBC WITH DIFFERENTIAL/PLATELET
Basophils Absolute: 0.1 10*3/uL (ref 0.0–0.2)
Basos: 1 %
EOS (ABSOLUTE): 0.3 10*3/uL (ref 0.0–0.4)
Eos: 4 %
Hematocrit: 43.4 % (ref 37.5–51.0)
Hemoglobin: 14.4 g/dL (ref 13.0–17.7)
Immature Grans (Abs): 0 10*3/uL (ref 0.0–0.1)
Immature Granulocytes: 0 %
Lymphocytes Absolute: 1.9 10*3/uL (ref 0.7–3.1)
Lymphs: 24 %
MCH: 29.9 pg (ref 26.6–33.0)
MCHC: 33.2 g/dL (ref 31.5–35.7)
MCV: 90 fL (ref 79–97)
Monocytes Absolute: 0.3 10*3/uL (ref 0.1–0.9)
Monocytes: 4 %
Neutrophils Absolute: 5.4 10*3/uL (ref 1.4–7.0)
Neutrophils: 67 %
Platelets: 254 10*3/uL (ref 150–450)
RBC: 4.81 x10E6/uL (ref 4.14–5.80)
RDW: 13.2 % (ref 11.6–15.4)
WBC: 8 10*3/uL (ref 3.4–10.8)

## 2019-10-11 LAB — LIPID PANEL
Chol/HDL Ratio: 4.1 ratio (ref 0.0–5.0)
Cholesterol, Total: 134 mg/dL (ref 100–199)
HDL: 33 mg/dL — ABNORMAL LOW (ref >39)
LDL Chol Calc (NIH): 76 mg/dL (ref 0–99)
Triglycerides: 143 mg/dL (ref 0–149)
VLDL Cholesterol Cal: 25 mg/dL (ref 5–40)

## 2019-10-11 LAB — CMP14+EGFR
ALT: 15 IU/L (ref 0–44)
AST: 18 IU/L (ref 0–40)
Albumin/Globulin Ratio: 1.9 (ref 1.2–2.2)
Albumin: 4.9 g/dL — ABNORMAL HIGH (ref 3.8–4.8)
Alkaline Phosphatase: 87 IU/L (ref 39–117)
BUN/Creatinine Ratio: 20 (ref 10–24)
BUN: 26 mg/dL (ref 8–27)
Bilirubin Total: 0.9 mg/dL (ref 0.0–1.2)
CO2: 28 mmol/L (ref 20–29)
Calcium: 10.5 mg/dL — ABNORMAL HIGH (ref 8.6–10.2)
Chloride: 97 mmol/L (ref 96–106)
Creatinine, Ser: 1.32 mg/dL — ABNORMAL HIGH (ref 0.76–1.27)
GFR calc Af Amer: 63 mL/min/{1.73_m2} (ref >59)
GFR calc non Af Amer: 54 mL/min/{1.73_m2} — ABNORMAL LOW (ref >59)
Globulin, Total: 2.6 g/dL (ref 1.5–4.5)
Glucose: 135 mg/dL — ABNORMAL HIGH (ref 65–99)
Potassium: 4.5 mmol/L (ref 3.5–5.2)
Sodium: 140 mmol/L (ref 134–144)
Total Protein: 7.5 g/dL (ref 6.0–8.5)

## 2019-10-11 LAB — VITAMIN B12: Vitamin B-12: 183 pg/mL — ABNORMAL LOW (ref 232–1245)

## 2019-10-11 LAB — FOLATE: Folate: >20 ng/mL (ref >3.0)

## 2019-10-11 LAB — VITAMIN D 25 HYDROXY (VIT D DEFICIENCY, FRACTURES): Vit D, 25-Hydroxy: 34.9 ng/mL (ref 30.0–100.0)

## 2019-12-12 ENCOUNTER — Other Ambulatory Visit: Payer: Self-pay | Admitting: Family Medicine

## 2019-12-12 DIAGNOSIS — I10 Essential (primary) hypertension: Secondary | ICD-10-CM

## 2019-12-13 ENCOUNTER — Other Ambulatory Visit: Payer: Self-pay

## 2019-12-13 MED ORDER — GNP PRENATAL 28-0.8 MG PO TABS: ORAL_TABLET | ORAL | 3 refills | Status: DC

## 2020-04-04 ENCOUNTER — Ambulatory Visit: Payer: Medicare HMO | Admitting: Family Medicine

## 2020-04-04 ENCOUNTER — Other Ambulatory Visit: Payer: Self-pay | Admitting: Family Medicine

## 2020-04-04 DIAGNOSIS — I10 Essential (primary) hypertension: Secondary | ICD-10-CM

## 2020-05-14 ENCOUNTER — Ambulatory Visit (INDEPENDENT_AMBULATORY_CARE_PROVIDER_SITE_OTHER): Payer: Medicare HMO | Admitting: Family Medicine

## 2020-05-14 ENCOUNTER — Encounter: Payer: Self-pay | Admitting: Family Medicine

## 2020-05-14 ENCOUNTER — Other Ambulatory Visit: Payer: Self-pay

## 2020-05-14 VITALS — BP 131/82 | HR 101 | Temp 97.6°F | Ht 67.0 in | Wt 169.0 lb

## 2020-05-14 DIAGNOSIS — I10 Essential (primary) hypertension: Secondary | ICD-10-CM | POA: Diagnosis not present

## 2020-05-14 DIAGNOSIS — R7303 Prediabetes: Secondary | ICD-10-CM

## 2020-05-14 DIAGNOSIS — E782 Mixed hyperlipidemia: Secondary | ICD-10-CM | POA: Diagnosis not present

## 2020-05-14 DIAGNOSIS — E538 Deficiency of other specified B group vitamins: Secondary | ICD-10-CM

## 2020-05-14 LAB — BAYER DCA HB A1C WAIVED: HB A1C (BAYER DCA - WAIVED): 5.6 % (ref <7.0)

## 2020-05-14 NOTE — Progress Notes (Signed)
BP 131/82   Pulse (!) 101   Temp 97.6 F (36.4 C)   Ht 5' 7"  (1.702 m)   Wt 169 lb (76.7 kg)   SpO2 97%   BMI 26.47 kg/m    Subjective:   Patient ID: Edwin [name], male    DOB: February 15, 1951, 69 y.o.   MRN: [id number]  HPI: Edwin [name] is a 69 y.o. male presenting on 05/14/2020 for Medical Management of Chronic Issues, Hypertension, and Prediabetes   HPI Prediabetes Patient comes in today for recheck of his diabetes. Patient has been currently taking no medication and has been diet-controlled prediabetic. Patient is currently on an ACE inhibitor/ARB. Patient has not seen an ophthalmologist this year. Patient denies any issues with their feet. The symptom started onset as an adult hypertension and hyperlipidemia and A. fib ARE RELATED TO DM   Hypertension Patient is currently on Lasix and lisinopril hydrochlorothiazide, and their blood pressure today is 131/82. Patient denies any lightheadedness or dizziness. Patient denies headaches, blurred vision, chest pains, shortness of breath, or weakness. Denies any side effects from medication and is content with current medication.   Hyperlipidemia Patient is coming in for recheck of his hyperlipidemia. The patient is currently taking atorvastatin. They deny any issues with myalgias or history of liver damage from it. They deny any focal numbness or weakness or chest pain.    Anticoag recheck Patient is on Eliquis Reason on anticoagulation: Recurrent DVTs Patient denies any bruising or bleeding or chest pain or palpitations   Relevant past medical, surgical, family and social history reviewed and updated as indicated. Interim medical history since our last visit reviewed. Allergies and medications reviewed and updated.  Review of Systems  Constitutional: Negative for chills and fever.  Eyes: Negative for visual disturbance.  Respiratory: Negative for shortness of breath and wheezing.   Cardiovascular: Negative for chest pain,  palpitations and leg swelling.  Musculoskeletal: Negative for back pain and gait problem.  Skin: Negative for rash.  Neurological: Negative for dizziness, weakness and light-headedness.  All other systems reviewed and are negative.   Per HPI unless specifically indicated above   Allergies as of 05/14/2020   No Known Allergies     Medication List       Accurate as of May 14, 2020  2:16 PM. If you have any questions, ask your nurse or doctor.        apixaban 5 MG Tabs tablet Commonly known as: Eliquis TAKE (1) TABLET TWICE A DAY.   atorvastatin 20 MG tablet Commonly known as: LIPITOR Take 1 tablet (20 mg total) by mouth daily.   cyanocobalamin 1000 MCG/ML injection Commonly known as: (VITAMIN B-12) Inject 1 mL (1,000 mcg total) into the muscle once a week.   furosemide 20 MG tablet Commonly known as: LASIX TAKE (1) TABLET DAILY AS DIRECTED.   GNP PreNatal 28-0.8 MG Tabs TAKE 1 TABLET ONCE DAILY AT NOON   lisinopril-hydrochlorothiazide 20-12.5 MG tablet Commonly known as: ZESTORETIC TAKE 1 TABLET ONCE DAILY   pantoprazole 40 MG tablet Commonly known as: PROTONIX TAKE 1 TABLET DAILY   tamsulosin 0.4 MG Caps capsule Commonly known as: FLOMAX TAKE (1) CAPSULE DAILY   Vitamin D3 50 MCG (2000 UT) Tabs Take 1 tablet by mouth daily.        Objective:   BP 131/82   Pulse (!) 101   Temp 97.6 F (36.4 C)   Ht 5' 7"  (1.702 m)   Wt 169 lb (76.7 kg)   SpO2  97%   BMI 26.47 kg/m   Wt Readings from Last 3 Encounters:  05/14/20 169 lb (76.7 kg)  08/17/18 171 lb 3.2 oz (77.7 kg)  02/03/18 171 lb 6.4 oz (77.7 kg)    Physical Exam Vitals and nursing note reviewed.  Constitutional:      General: He is not in acute distress.    Appearance: He is well-developed. He is not diaphoretic.  Eyes:     General: No scleral icterus.    Conjunctiva/sclera: Conjunctivae normal.  Neck:     Thyroid: No thyromegaly.  Cardiovascular:     Rate and Rhythm: Normal rate.  Rhythm irregular.     Heart sounds: Normal heart sounds. No murmur heard.   Pulmonary:     Effort: Pulmonary effort is normal. No respiratory distress.     Breath sounds: Normal breath sounds. No wheezing.  Musculoskeletal:        General: Normal range of motion.     Cervical back: Neck supple.  Lymphadenopathy:     Cervical: No cervical adenopathy.  Skin:    General: Skin is warm and dry.     Findings: No rash.  Neurological:     Mental Status: He is alert and oriented to person, place, and time.     Coordination: Coordination normal.  Psychiatric:        Behavior: Behavior normal.       Assessment & Plan:   Problem List Items Addressed This Visit      Cardiovascular and Mediastinum   Essential hypertension - Primary   Relevant Orders   CBC with Differential/Platelet   CMP14+EGFR     Other   Vitamin B12 deficiency   Relevant Orders   Vitamin B12   Folate   Prediabetes   Relevant Orders   CMP14+EGFR   Bayer DCA Hb A1c Waived   Hyperlipidemia   Relevant Orders   Lipid panel      Continue current medication, will check blood work and A1c. Follow up plan: Return in about 6 months (around 11/14/2020), or if symptoms worsen or fail to improve, for Hyperlipidemia and hypertension and A. fib recheck.  Counseling provided for all of the vaccine components Orders Placed This Encounter  Procedures  . CBC with Differential/Platelet  . CMP14+EGFR  . Lipid panel  . Bayer DCA Hb A1c Waived  . Vitamin B12  . Folate    Caryl Pina, MD [facility] 05/14/2020, 2:16 PM

## 2020-05-15 LAB — CMP14+EGFR
ALT: 10 IU/L (ref 0–44)
AST: 20 IU/L (ref 0–40)
Albumin/Globulin Ratio: 1.9 (ref 1.2–2.2)
Albumin: 4.9 g/dL — ABNORMAL HIGH (ref 3.8–4.8)
Alkaline Phosphatase: 81 IU/L (ref 48–121)
BUN/Creatinine Ratio: 11 (ref 10–24)
BUN: 17 mg/dL (ref 8–27)
Bilirubin Total: 0.6 mg/dL (ref 0.0–1.2)
CO2: 25 mmol/L (ref 20–29)
Calcium: 10.2 mg/dL (ref 8.6–10.2)
Chloride: 101 mmol/L (ref 96–106)
Creatinine, Ser: 1.55 mg/dL — ABNORMAL HIGH (ref 0.76–1.27)
GFR calc Af Amer: 52 mL/min/{1.73_m2} — ABNORMAL LOW (ref >59)
GFR calc non Af Amer: 45 mL/min/{1.73_m2} — ABNORMAL LOW (ref >59)
Globulin, Total: 2.6 g/dL (ref 1.5–4.5)
Glucose: 131 mg/dL — ABNORMAL HIGH (ref 65–99)
Potassium: 4 mmol/L (ref 3.5–5.2)
Sodium: 142 mmol/L (ref 134–144)
Total Protein: 7.5 g/dL (ref 6.0–8.5)

## 2020-05-15 LAB — CBC WITH DIFFERENTIAL/PLATELET
Basophils Absolute: 0.1 10*3/uL (ref 0.0–0.2)
Basos: 1 %
EOS (ABSOLUTE): 0.2 10*3/uL (ref 0.0–0.4)
Eos: 2 %
Hematocrit: 40.8 % (ref 37.5–51.0)
Hemoglobin: 13.9 g/dL (ref 13.0–17.7)
Immature Grans (Abs): 0 10*3/uL (ref 0.0–0.1)
Immature Granulocytes: 0 %
Lymphocytes Absolute: 1.6 10*3/uL (ref 0.7–3.1)
Lymphs: 21 %
MCH: 30.3 pg (ref 26.6–33.0)
MCHC: 34.1 g/dL (ref 31.5–35.7)
MCV: 89 fL (ref 79–97)
Monocytes Absolute: 0.4 10*3/uL (ref 0.1–0.9)
Monocytes: 5 %
Neutrophils Absolute: 5.3 10*3/uL (ref 1.4–7.0)
Neutrophils: 71 %
Platelets: 225 10*3/uL (ref 150–450)
RBC: 4.58 x10E6/uL (ref 4.14–5.80)
RDW: 13.2 % (ref 11.6–15.4)
WBC: 7.5 10*3/uL (ref 3.4–10.8)

## 2020-05-15 LAB — LIPID PANEL
Chol/HDL Ratio: 4.8 ratio (ref 0.0–5.0)
Cholesterol, Total: 139 mg/dL (ref 100–199)
HDL: 29 mg/dL — ABNORMAL LOW (ref >39)
LDL Chol Calc (NIH): 91 mg/dL (ref 0–99)
Triglycerides: 102 mg/dL (ref 0–149)
VLDL Cholesterol Cal: 19 mg/dL (ref 5–40)

## 2020-05-15 LAB — FOLATE: Folate: >20 ng/mL (ref >3.0)

## 2020-05-15 LAB — VITAMIN B12: Vitamin B-12: 200 pg/mL — ABNORMAL LOW (ref 232–1245)

## 2020-07-10 ENCOUNTER — Other Ambulatory Visit: Payer: Self-pay | Admitting: Family Medicine

## 2020-07-10 DIAGNOSIS — I10 Essential (primary) hypertension: Secondary | ICD-10-CM

## 2020-07-23 ENCOUNTER — Other Ambulatory Visit: Payer: Self-pay | Admitting: Family Medicine

## 2020-10-29 ENCOUNTER — Ambulatory Visit: Payer: Medicare HMO | Admitting: Family Medicine

## 2020-10-30 ENCOUNTER — Ambulatory Visit (INDEPENDENT_AMBULATORY_CARE_PROVIDER_SITE_OTHER): Payer: Medicare HMO | Admitting: *Deleted

## 2020-10-30 DIAGNOSIS — Z0000 Encounter for general adult medical examination without abnormal findings: Secondary | ICD-10-CM

## 2020-10-30 NOTE — Patient Instructions (Signed)
  Edwin [name] , Thank you for taking time to come for your Medicare Wellness Visit. I appreciate your ongoing commitment to your health goals. Please review the following plan we discussed and let me know if I can assist you in the future.   These are the goals we discussed: Goals    . DIET - INCREASE WATER INTAKE    . Exercise 150 min/wk Moderate Activity       This is a list of the screening recommended for you and due dates:  Health Maintenance  Topic Date Due  . Colon Cancer Screening  05/14/2021*  . Tetanus Vaccine  05/14/2021*  . Pneumonia vaccines (1 of 2 - PCV13) 05/14/2021*  .  Hepatitis C: One time screening is recommended by Center for Disease Control  (CDC) for  adults born from [birth year] through 1967.   Completed  . Flu Shot  Discontinued  . COVID-19 Vaccine  Discontinued  *Topic was postponed. The date shown is not the original due date.

## 2020-10-30 NOTE — Progress Notes (Signed)
MEDICARE ANNUAL WELLNESS VISIT  10/30/2020  Telephone Visit Disclaimer This Medicare AWV was conducted by telephone due to national recommendations for restrictions regarding the COVID-19 Pandemic (e.g. social distancing).  I verified, using two identifiers, that I am speaking with Edwin [name] or their authorized healthcare agent. I discussed the limitations, risks, security, and privacy concerns of performing an evaluation and management service by telephone and the potential availability of an in-person appointment in the future. The patient expressed understanding and agreed to proceed.  Location of Patient: home Location of Provider (nurse):  office  Subjective:    Edwin [name] is a 70 y.o. male patient of Dettinger, Elige Radon, MD who had a Medicare Annual Wellness Visit today via telephone. Lesley is Retired and lives alone. he has 1 child. he reports that he is socially active and does interact with friends/family regularly. he is minimally physically active and enjoys watching TV and working jigsaw puzzles.  Patient Care Team: Dettinger, Elige Radon, MD as PCP - General (Family Medicine) Marcine Matar, MD as Consulting Physician (Urology) Beryle Beams, MD as Consulting Physician (Neurology)  Advanced Directives 10/30/2020 12/02/2017 07/16/2015 07/09/2015 07/03/2015 06/17/2015 12/13/2014  Does Patient Have a Medical Advance Directive? No No Yes Yes Yes No;Yes No  Type of Advance Directive - - Customer service manager Power of [employer] Power of Attorney -  Copy of Healthcare Power of Attorney in Chart? - - - Yes Yes Yes -  Would patient like information on creating a medical advance directive? No - Patient declined No - Patient declined - - - - No - patient declined information    Hospital Utilization Over the Past 12 Months: # of hospitalizations or ER visits: 0 # of surgeries: 0  Review of Systems    Patient reports that his  overall health is better compared to last year.  History obtained from the patient  Patient Reported Readings (BP, Pulse, CBG, Weight, etc) none  Pain Assessment Pain : No/denies pain     Current Medications & Allergies (verified) Allergies as of 10/30/2020   No Known Allergies     Medication List       Accurate as of October 30, 2020  9:20 AM. If you have any questions, ask your nurse or doctor.        apixaban 5 MG Tabs tablet Commonly known as: Eliquis TAKE (1) TABLET TWICE A DAY.   atorvastatin 20 MG tablet Commonly known as: LIPITOR Take 1 tablet (20 mg total) by mouth daily.   cyanocobalamin 1000 MCG/ML injection Commonly known as: (VITAMIN B-12) Inject 1 mL (1,000 mcg total) into the muscle once a week.   furosemide 20 MG tablet Commonly known as: LASIX TAKE (1) TABLET DAILY AS DIRECTED.   GNP PreNatal 28-0.8 MG Tabs TAKE 1 TABLET ONCE DAILY AT NOON   lisinopril-hydrochlorothiazide 20-12.5 MG tablet Commonly known as: ZESTORETIC TAKE 1 TABLET ONCE DAILY   pantoprazole 40 MG tablet Commonly known as: PROTONIX TAKE 1 TABLET DAILY   tamsulosin 0.4 MG Caps capsule Commonly known as: FLOMAX TAKE (1) CAPSULE DAILY   Vitamin D3 50 MCG (2000 UT) Tabs Take 1 tablet by mouth daily.       History (reviewed): Past Medical History:  Diagnosis Date  . Acute encephalopathy 08/14/2014  . Anemia    nutritional anemia  . Anemia    posthemorrhagic anemia  . Bladder calculus 10/06/2014  . Blood dyscrasia    pancytopenia  .  BPH (benign prostatic hyperplasia) 01/31/2015  . DVT (deep venous thrombosis) ([place]) [id number]   left leg  . Dysphagia 10/11/14  . Encephalopathy   . Essential hypertension 01/31/2015  . Failure to thrive in adult 08/22/14  . Hx of sepsis 12/15  . Megaloblastic anemia due to B12 deficiency 08/15/2014  . Neuromuscular disorder (H[facility])    muscle weakness  . Pancytopenia ([place]) 08/14/2014  . Peripheral vascular disease ([place]) 10/11/14   acute  embolism and thrombosis Rt femoral vein  . Renal disorder    Bladder stone,Bladder diverticulm  . Right leg DVT ([place]) 08/14/2014   R. femoral vein  . S/P IVC filter 10/05/2014   Sec to hematuria on Eliquis  . Sepsis ([place]) 10/05/2014  . UTI (lower urinary tract infection)    Past Surgical History:  Procedure Laterality Date  . CYSTOSCOPY N/A 08/21/2014   Procedure: CYSTOSCOPY FLEXIBLE;  Surgeon: Jorja Loa, MD;  Location: [facility];  Service: Urology;  Laterality: N/A;  at bedside  . CYSTOSCOPY WITH LITHOLAPAXY N/A 11/22/2014   Procedure: CYSTOSCOPY WITH LITHOLAPAXY;  Surgeon: Jorja Loa, MD;  Location: [facility];  Service: Urology;  Laterality: N/A;  . TRANSURETHRAL INCISION OF PROSTATE N/A 11/22/2014   Procedure: TRANSURETHRAL INCISION OF THE PROSTATE (TUIP);  Surgeon: Jorja Loa, MD;  Location: [facility];  Service: Urology;  Laterality: N/A;   Family History  Problem Relation Age of Onset  . Alzheimer's disease Mother 12  . Congenital Murmur Father   . Cancer Sister    Social History   Socioeconomic History  . Marital status: Single    Spouse name: Not on file  . Number of children: 1  . Years of education: 46  . Highest education level: High school graduate  Occupational History  . Occupation: Retired    Comment: Charity fundraiser  Tobacco Use  . Smoking status: Light Tobacco Smoker    Types: Cigars  . Smokeless tobacco: Never Used  Vaping Use  . Vaping Use: Never used  Substance and Sexual Activity  . Alcohol use: No    Alcohol/week: 0.0 standard drinks  . Drug use: No  . Sexual activity: Not Currently  Other Topics Concern  . Not on file  Social History Narrative   Patient lives alone in a ground floor apartment. He is retired from the [employer]. He has one son.        Social Determinants of Health   Financial Resource Strain: Low Risk   . Difficulty of Paying Living Expenses: Not hard at all  Food Insecurity: No Food Insecurity  . Worried  About Charity fundraiser in the Last Year: Never true  . Ran Out of Food in the Last Year: Never true  Transportation Needs: No Transportation Needs  . Lack of Transportation (Medical): No  . Lack of Transportation (Non-Medical): No  Physical Activity: Inactive  . Days of Exercise per Week: 0 days  . Minutes of Exercise per Session: 0 min  Stress: No Stress Concern Present  . Feeling of Stress : Not at all  Social Connections: Socially Isolated  . Frequency of Communication with Friends and Family: More than three times a week  . Frequency of Social Gatherings with Friends and Family: More than three times a week  . Attends Religious Services: Never  . Active Member of Clubs or Organizations: No  . Attends Archivist Meetings: Never  . Marital Status: Never married    Activities of Daily Living In your present  state of health, do you have any difficulty performing the following activities: 10/30/2020  Hearing? N  Vision? N  Difficulty concentrating or making decisions? N  Walking or climbing stairs? N  Dressing or bathing? N  Doing errands, shopping? N  Preparing Food and eating ? N  Using the Toilet? N  In the past six months, have you accidently leaked urine? N  Do you have problems with loss of bowel control? N  Managing your Medications? N  Managing your Finances? N  Housekeeping or managing your Housekeeping? N  Some recent data might be hidden    Patient Education/ Literacy How often do you need to have someone help you when you read instructions, pamphlets, or other written materials from your doctor or pharmacy?: 1 - Never What is the last grade level you completed in school?: 12th grade  Exercise Current Exercise Habits: The patient does not participate in regular exercise at present, Exercise limited by: None identified  Diet Patient reports consuming 3 meals a day and 1 snack(s) a day Patient reports that his primary diet is: Regular Patient reports  that she does have regular access to food.   Depression Screen PHQ 2/9 Scores 10/30/2020 05/14/2020 08/17/2018 02/03/2018 12/02/2017 08/05/2017 03/30/2017  PHQ - 2 Score 0 0 0 0 0 0 0     Fall Risk Fall Risk  10/30/2020 05/14/2020 08/17/2018 02/03/2018 12/02/2017  Falls in the past year? 0 0 No No No  Number falls in past yr: - - - - -  Injury with Fall? - - - - -  Risk for fall due to : No Fall Risks - - - -  Follow up Falls evaluation completed - - - -     Objective:  Edwin [name] seemed alert and oriented and he participated appropriately during our telephone visit.  Blood Pressure Weight BMI  BP Readings from Last 3 Encounters:  05/14/20 131/82  08/17/18 127/83  02/03/18 131/84   Wt Readings from Last 3 Encounters:  05/14/20 169 lb (76.7 kg)  08/17/18 171 lb 3.2 oz (77.7 kg)  02/03/18 171 lb 6.4 oz (77.7 kg)   BMI Readings from Last 1 Encounters:  05/14/20 26.47 kg/m    *Unable to obtain current vital signs, weight, and BMI due to telephone visit type  Hearing/Vision  . Jewett did not seem to have difficulty with hearing/understanding during the telephone conversation . Reports that he has not had a formal eye exam by an eye care professional within the past year . Reports that he has not had a formal hearing evaluation within the past year *Unable to fully assess hearing and vision during telephone visit type  Cognitive Function: 6CIT Screen 10/30/2020  What Year? 0 points  What month? 0 points  What time? 0 points  Count back from 20 0 points  Months in reverse 4 points  Repeat phrase 0 points  Total Score 4   (Normal:0-7, Significant for Dysfunction: >8)  Normal Cognitive Function Screening: Yes   Immunization & Health Maintenance Record  There is no immunization history on file for this patient.  Health Maintenance  Topic Date Due  . COLONOSCOPY (Pts 45-26yrs Insurance coverage will need to be confirmed)  05/14/2021 (Originally 07/01/1994)  . TETANUS/TDAP   05/14/2021 (Originally 07/01/1968)  . PNA vac Low Risk Adult (1 of 2 - PCV13) 05/14/2021 (Originally 07/01/2014)  . Hepatitis C Screening  Completed  . INFLUENZA VACCINE  Discontinued  . COVID-19 Vaccine  Discontinued  Assessment  This is a routine wellness examination for Edwin [name].  Health Maintenance: Due or Overdue There are no preventive care reminders to display for this patient.  Edwin [name] does not need a referral for Community Assistance: Care Management:   no Social Work:    no Prescription Assistance:  no Nutrition/Diabetes Education:  no   Plan:  Personalized Goals Goals Addressed            This Visit's Progress   . DIET - INCREASE WATER INTAKE        Personalized Health Maintenance & Screening Recommendations  Advanced directives: has NO advanced directive - not interested in additional information  Lung Cancer Screening Recommended: no (Low Dose CT Chest recommended if Age 39-80 years, 30 pack-year currently smoking OR have quit w/in past 15 years) Hepatitis C Screening recommended: no HIV Screening recommended: no  Advanced Directives: Written information was not prepared per patient's request.  Referrals & Orders No orders of the defined types were placed in this encounter.   Follow-up Plan . Follow-up with Dettinger, Elige Radon, MD as planned . Consider recommended Vaccines including COVID, flu, Prevnar, TDAP and Shingrix . Consider Colon Cancer Screening   I have personally reviewed and noted the following in the patient's chart:   . Medical and social history . Use of alcohol, tobacco or illicit drugs  . Current medications and supplements . Functional ability and status . Nutritional status . Physical activity . Advanced directives . List of other physicians . Hospitalizations, surgeries, and ER visits in previous 12 months . Vitals . Screenings to include cognitive, depression, and falls . Referrals and appointments  In  addition, I have reviewed and discussed with Edwin [name] certain preventive protocols, quality metrics, and best practice recommendations. A written personalized care plan for preventive services as well as general preventive health recommendations is available and can be mailed to the patient at his request.      Hessie Diener, LPN  11/01/6158

## 2020-11-06 ENCOUNTER — Other Ambulatory Visit: Payer: Self-pay | Admitting: Family Medicine

## 2020-11-06 DIAGNOSIS — I10 Essential (primary) hypertension: Secondary | ICD-10-CM

## 2020-11-26 ENCOUNTER — Ambulatory Visit (INDEPENDENT_AMBULATORY_CARE_PROVIDER_SITE_OTHER): Payer: Medicare HMO | Admitting: Family Medicine

## 2020-11-26 ENCOUNTER — Encounter: Payer: Self-pay | Admitting: Family Medicine

## 2020-11-26 ENCOUNTER — Other Ambulatory Visit: Payer: Self-pay

## 2020-11-26 VITALS — BP 106/72 | HR 124 | Ht 67.0 in | Wt 175.0 lb

## 2020-11-26 DIAGNOSIS — I10 Essential (primary) hypertension: Secondary | ICD-10-CM | POA: Diagnosis not present

## 2020-11-26 DIAGNOSIS — I82409 Acute embolism and thrombosis of unspecified deep veins of unspecified lower extremity: Secondary | ICD-10-CM

## 2020-11-26 DIAGNOSIS — E782 Mixed hyperlipidemia: Secondary | ICD-10-CM | POA: Diagnosis not present

## 2020-11-26 DIAGNOSIS — R000 Tachycardia, unspecified: Secondary | ICD-10-CM

## 2020-11-26 DIAGNOSIS — R7303 Prediabetes: Secondary | ICD-10-CM | POA: Diagnosis not present

## 2020-11-26 DIAGNOSIS — D531 Other megaloblastic anemias, not elsewhere classified: Secondary | ICD-10-CM

## 2020-11-26 LAB — BAYER DCA HB A1C WAIVED: HB A1C (BAYER DCA - WAIVED): 5.6 % (ref <7.0)

## 2020-11-26 MED ORDER — APIXABAN 5 MG PO TABS: ORAL_TABLET | ORAL | 3 refills | Status: DC

## 2020-11-26 MED ORDER — PANTOPRAZOLE SODIUM 40 MG PO TBEC: 40.0000 mg | DELAYED_RELEASE_TABLET | Freq: Every day | ORAL | 3 refills | Status: DC

## 2020-11-26 MED ORDER — ATORVASTATIN CALCIUM 20 MG PO TABS: 20.0000 mg | ORAL_TABLET | Freq: Every day | ORAL | 3 refills | Status: DC

## 2020-11-26 MED ORDER — TAMSULOSIN HCL 0.4 MG PO CAPS: 0.4000 mg | ORAL_CAPSULE | Freq: Every day | ORAL | 3 refills | Status: DC

## 2020-11-26 MED ORDER — LISINOPRIL-HYDROCHLOROTHIAZIDE 20-12.5 MG PO TABS: 1.0000 | ORAL_TABLET | Freq: Every day | ORAL | 3 refills | Status: DC

## 2020-11-26 MED ORDER — FUROSEMIDE 20 MG PO TABS: 20.0000 mg | ORAL_TABLET | Freq: Every day | ORAL | 3 refills | Status: DC

## 2020-11-26 NOTE — Progress Notes (Signed)
BP 106/72   Pulse (!) 124   Ht 5' 7"  (1.702 m)   Wt 175 lb (79.4 kg)   SpO2 97%   BMI 27.41 kg/m    Subjective:   Patient ID: Edwin [name], male    DOB: 05/08/1951, 70 y.o.   MRN: [id number]  HPI: Edwin [name] is a 70 y.o. male presenting on 11/26/2020 for Medical Management of Chronic Issues and Hypertension   HPI Pt coming in for a 6 months recheck on chronic conditions. He states he is tolerating her current medications well and has no complaints. He has been living at home and doesn't get out much so has gained a little weight. He denies fevers, chills, weight loss, chest pain, difficulty breathing, muscle aches, HA, numbness or tingling, syncope, visual disturbances, weakness or gait problems.  Tachycardia Pt is presenting today with tachycardia. His heart rate is 122 bpm. He does have a history of DVTs and an IVC filter which he is currently taking 5 mg Apixaban for. Pt states he smokes 2 cigars a day. He denies chest pain, difficulty or painful breathing, hemoptysis, leg swelling or pain, or fevers. He does not appear to be in any distress and is acting normal without complaints. He expresses interest in cutting back on his cigar smoking and says that he will attempt to cut back to one a day.   Relevant past medical, surgical, family and social history reviewed and updated as indicated. Interim medical history since our last visit reviewed. Allergies and medications reviewed and updated.  Review of Systems  Constitutional: Negative for chills, diaphoresis, fatigue, fever and unexpected weight change.  HENT: Negative.   Eyes: Negative.   Respiratory: Negative for cough, chest tightness and shortness of breath.   Cardiovascular: Negative for chest pain, palpitations and leg swelling.  Gastrointestinal: Negative.   Endocrine: Negative for polyuria.  Genitourinary: Negative.  Negative for dysuria, hematuria and urgency.  Musculoskeletal: Negative.   Skin: Negative.    Neurological: Negative for dizziness, syncope, weakness, light-headedness, numbness and headaches.  Hematological: Negative.   Psychiatric/Behavioral: Negative.     Per HPI unless specifically indicated above   Allergies as of 11/26/2020   No Known Allergies     Medication List       Accurate as of November 26, 2020 11:02 AM. If you have any questions, ask your nurse or doctor.        STOP taking these medications   cyanocobalamin 1000 MCG/ML injection Commonly known as: (VITAMIN B-12) Stopped by: Fransisca Kaufmann Rodolph Hagemann, MD     TAKE these medications   apixaban 5 MG Tabs tablet Commonly known as: Eliquis TAKE (1) TABLET TWICE A DAY.   atorvastatin 20 MG tablet Commonly known as: LIPITOR Take 1 tablet (20 mg total) by mouth daily.   furosemide 20 MG tablet Commonly known as: LASIX Take 1 tablet (20 mg total) by mouth daily. What changed: See the new instructions. Changed by: Fransisca Kaufmann Tayvian Holycross, MD   GNP PreNatal 28-0.8 MG Tabs TAKE 1 TABLET ONCE DAILY AT NOON   lisinopril-hydrochlorothiazide 20-12.5 MG tablet Commonly known as: ZESTORETIC Take 1 tablet by mouth daily.   pantoprazole 40 MG tablet Commonly known as: PROTONIX Take 1 tablet (40 mg total) by mouth daily.   tamsulosin 0.4 MG Caps capsule Commonly known as: FLOMAX Take 1 capsule (0.4 mg total) by mouth daily after supper. What changed: See the new instructions. Changed by: Worthy Rancher, MD   Vitamin D3 50 MCG (2000 UT)  Tabs Take 1 tablet by mouth daily.        Objective:   BP 106/72   Pulse (!) 124   Ht 5' 7"  (1.702 m)   Wt 175 lb (79.4 kg)   SpO2 97%   BMI 27.41 kg/m   Wt Readings from Last 3 Encounters:  11/26/20 175 lb (79.4 kg)  05/14/20 169 lb (76.7 kg)  08/17/18 171 lb 3.2 oz (77.7 kg)    Physical Exam Constitutional:      Appearance: Normal appearance.  HENT:     Right Ear: Tympanic membrane normal. There is impacted cerumen.     Left Ear: Tympanic membrane normal.  There is no impacted cerumen.  Eyes:     Extraocular Movements: Extraocular movements intact.     Conjunctiva/sclera: Conjunctivae normal.     Pupils: Pupils are equal, round, and reactive to light.  Cardiovascular:     Rate and Rhythm: Regular rhythm. Tachycardia present.     Pulses: Normal pulses.     Heart sounds: Normal heart sounds.  Pulmonary:     Effort: Pulmonary effort is normal. No respiratory distress.     Breath sounds: Normal breath sounds. No wheezing, rhonchi or rales.  Abdominal:     General: Abdomen is flat.     Palpations: Abdomen is soft. There is no mass.     Tenderness: There is no abdominal tenderness.  Musculoskeletal:        General: No swelling, tenderness or deformity. Normal range of motion.     Cervical back: Neck supple.     Right lower leg: No edema.     Left lower leg: No edema.  Skin:    General: Skin is warm and dry.     Findings: No bruising.  Neurological:     General: No focal deficit present.     Mental Status: He is alert and oriented to person, place, and time.     Motor: No weakness.     Gait: Gait normal.  Psychiatric:        Mood and Affect: Mood normal.        Behavior: Behavior normal.       Assessment & Plan:   Problem List Items Addressed This Visit      Cardiovascular and Mediastinum   Essential hypertension - Primary   Relevant Medications   lisinopril-hydrochlorothiazide (ZESTORETIC) 20-12.5 MG tablet   furosemide (LASIX) 20 MG tablet   atorvastatin (LIPITOR) 20 MG tablet   apixaban (ELIQUIS) 5 MG TABS tablet   Recurrent deep vein thrombosis (DVT) (HCC)   Relevant Medications   lisinopril-hydrochlorothiazide (ZESTORETIC) 20-12.5 MG tablet   furosemide (LASIX) 20 MG tablet   atorvastatin (LIPITOR) 20 MG tablet   apixaban (ELIQUIS) 5 MG TABS tablet     Other   Megaloblastic anemia due to B12 deficiency   Relevant Orders   Vitamin B12 (Completed)   Prediabetes   Relevant Orders   CBC with Differential/Platelet  (Completed)   CMP14+EGFR (Completed)   Bayer DCA Hb A1c Waived (Completed)   Hyperlipidemia   Relevant Medications   lisinopril-hydrochlorothiazide (ZESTORETIC) 20-12.5 MG tablet   furosemide (LASIX) 20 MG tablet   atorvastatin (LIPITOR) 20 MG tablet   apixaban (ELIQUIS) 5 MG TABS tablet   Other Relevant Orders   Lipid panel (Completed)    Other Visit Diagnoses    Tachycardia       Relevant Orders   EKG 12-Lead (Completed)       Follow up plan: Return in  about 6 months (around 05/26/2021), or if symptoms worsen or fail to improve, for Hypertension and hyperlipidemia recheck.   Pt EKG today shows sinus tachycardia. His HR after sitting for a while dropped to 105 bpm. He has no other signs or symptoms that would indicate a PE. He is already taking Apixaban and has an IVC filter and is in no respiratory distress, no chest pain, his lungs are CTA bilaterally, no leg swelling or pain, the legs are equal in size. He should return or seek help if he develops any chest pain of difficulty breathing.  Pt is otherwise tolerating current medications well. He will do routine blood work.  Counseling provided for all of the vaccine components Orders Placed This Encounter  Procedures  . CBC with Differential/Platelet  . CMP14+EGFR  . Lipid panel  . Bayer DCA Hb A1c Waived  . Vitamin B12  . EKG 12-Lead   Silverio Decamp 11/26/2020  I was personally present for all components of the history, physical exam and/or medical decision making.  I agree with the documentation performed by the PA student and agree with assessment and plan above.  PA student was [employer]  Asymptomatic sinus tachycardia likely from overstress and under conditioning from walking in here, we will keep an eye in the future.  Will check blood work.  Caryl Pina, MD [facility] 11/26/2020, 11:02 AM

## 2020-11-27 LAB — CBC WITH DIFFERENTIAL/PLATELET
Basophils Absolute: 0.1 10*3/uL (ref 0.0–0.2)
Basos: 1 %
EOS (ABSOLUTE): 0.3 10*3/uL (ref 0.0–0.4)
Eos: 3 %
Hematocrit: 40 % (ref 37.5–51.0)
Hemoglobin: 13.5 g/dL (ref 13.0–17.7)
Immature Grans (Abs): 0 10*3/uL (ref 0.0–0.1)
Immature Granulocytes: 0 %
Lymphocytes Absolute: 1.6 10*3/uL (ref 0.7–3.1)
Lymphs: 17 %
MCH: 30.2 pg (ref 26.6–33.0)
MCHC: 33.8 g/dL (ref 31.5–35.7)
MCV: 90 fL (ref 79–97)
Monocytes Absolute: 0.5 10*3/uL (ref 0.1–0.9)
Monocytes: 5 %
Neutrophils Absolute: 7 10*3/uL (ref 1.4–7.0)
Neutrophils: 74 %
Platelets: 240 10*3/uL (ref 150–450)
RBC: 4.47 x10E6/uL (ref 4.14–5.80)
RDW: 13.1 % (ref 11.6–15.4)
WBC: 9.4 10*3/uL (ref 3.4–10.8)

## 2020-11-27 LAB — CMP14+EGFR
ALT: 15 IU/L (ref 0–44)
AST: 16 IU/L (ref 0–40)
Albumin/Globulin Ratio: 1.8 (ref 1.2–2.2)
Albumin: 4.5 g/dL (ref 3.7–4.7)
Alkaline Phosphatase: 83 IU/L (ref 44–121)
BUN/Creatinine Ratio: 15 (ref 10–24)
BUN: 27 mg/dL (ref 8–27)
Bilirubin Total: 0.8 mg/dL (ref 0.0–1.2)
CO2: 25 mmol/L (ref 20–29)
Calcium: 9.6 mg/dL (ref 8.6–10.2)
Chloride: 98 mmol/L (ref 96–106)
Creatinine, Ser: 1.84 mg/dL — ABNORMAL HIGH (ref 0.76–1.27)
GFR calc Af Amer: 42 mL/min/{1.73_m2} — ABNORMAL LOW (ref >59)
GFR calc non Af Amer: 36 mL/min/{1.73_m2} — ABNORMAL LOW (ref >59)
Globulin, Total: 2.5 g/dL (ref 1.5–4.5)
Glucose: 123 mg/dL — ABNORMAL HIGH (ref 65–99)
Potassium: 3.7 mmol/L (ref 3.5–5.2)
Sodium: 140 mmol/L (ref 134–144)
Total Protein: 7 g/dL (ref 6.0–8.5)

## 2020-11-27 LAB — LIPID PANEL
Chol/HDL Ratio: 4.6 ratio (ref 0.0–5.0)
Cholesterol, Total: 130 mg/dL (ref 100–199)
HDL: 28 mg/dL — ABNORMAL LOW (ref >39)
LDL Chol Calc (NIH): 79 mg/dL (ref 0–99)
Triglycerides: 130 mg/dL (ref 0–149)
VLDL Cholesterol Cal: 23 mg/dL (ref 5–40)

## 2020-11-27 LAB — VITAMIN B12: Vitamin B-12: 155 pg/mL — ABNORMAL LOW (ref 232–1245)

## 2020-11-28 ENCOUNTER — Other Ambulatory Visit: Payer: Self-pay

## 2020-11-28 DIAGNOSIS — N289 Disorder of kidney and ureter, unspecified: Secondary | ICD-10-CM

## 2021-03-18 ENCOUNTER — Other Ambulatory Visit: Payer: Self-pay | Admitting: Family Medicine

## 2021-06-10 ENCOUNTER — Ambulatory Visit (INDEPENDENT_AMBULATORY_CARE_PROVIDER_SITE_OTHER): Payer: Medicare HMO | Admitting: Family Medicine

## 2021-06-10 ENCOUNTER — Other Ambulatory Visit: Payer: Self-pay

## 2021-06-10 ENCOUNTER — Encounter: Payer: Self-pay | Admitting: Family Medicine

## 2021-06-10 VITALS — BP 130/84 | HR 118 | Ht 67.0 in | Wt 170.0 lb

## 2021-06-10 DIAGNOSIS — E538 Deficiency of other specified B group vitamins: Secondary | ICD-10-CM

## 2021-06-10 DIAGNOSIS — E782 Mixed hyperlipidemia: Secondary | ICD-10-CM

## 2021-06-10 DIAGNOSIS — Z1211 Encounter for screening for malignant neoplasm of colon: Secondary | ICD-10-CM | POA: Diagnosis not present

## 2021-06-10 DIAGNOSIS — R739 Hyperglycemia, unspecified: Secondary | ICD-10-CM | POA: Diagnosis not present

## 2021-06-10 DIAGNOSIS — R7303 Prediabetes: Secondary | ICD-10-CM

## 2021-06-10 DIAGNOSIS — I82409 Acute embolism and thrombosis of unspecified deep veins of unspecified lower extremity: Secondary | ICD-10-CM | POA: Diagnosis not present

## 2021-06-10 DIAGNOSIS — I10 Essential (primary) hypertension: Secondary | ICD-10-CM

## 2021-06-10 LAB — BAYER DCA HB A1C WAIVED: HB A1C (BAYER DCA - WAIVED): 5.6 % (ref <7.0)

## 2021-06-10 NOTE — Progress Notes (Signed)
BP 130/84   Pulse (!) 118   Ht 5' 7"  (1.702 m)   Wt 170 lb (77.1 kg)   SpO2 96%   BMI 26.63 kg/m    Subjective:   Patient ID: Edwin [name], male    DOB: 12/17/1950, 70 y.o.   MRN: [id number]  HPI: Edwin [name] is a 70 y.o. male presenting on 06/10/2021 for Medical Management of Chronic Issues, Hyperlipidemia, and Hypertension   HPI Physical exam Patient is coming in today for physical exam and recheck of chronic medical issues. Patient denies any chest pain, shortness of breath, headaches or vision issues, abdominal complaints, diarrhea, nausea, vomiting, or joint issues.   Hypertension Patient is currently on lisinopril hydrochlorothiazide and furosemide, and their blood pressure today is 130/84. Patient denies any lightheadedness or dizziness. Patient denies headaches, blurred vision, chest pains, shortness of breath, or weakness. Denies any side effects from medication and is content with current medication.   Prediabetes  patient comes in today for recheck of his diabetes. Patient has been currently taking no medication has been diet controlled, will check A1c today.. Patient is currently on an ACE inhibitor/ARB. Patient has seen an ophthalmologist this year. Patient denies any issues with their feet. The symptom started onset as an adult hypertension and hyperlipidemia ARE RELATED TO DM   Hyperlipidemia Patient is coming in for recheck of his hyperlipidemia. The patient is currently taking Lipitor. They deny any issues with myalgias or history of liver damage from it. They deny any focal numbness or weakness or chest pain.   Patient has a history of recurrent DVTs and currently takes Eliquis, denies any major bleeding issues.  Patient is coming in for recheck for vitamin B12 deficiency and megaloblastic anemia.  Relevant past medical, surgical, family and social history reviewed and updated as indicated. Interim medical history since our last visit reviewed. Allergies and  medications reviewed and updated.  Review of Systems  Constitutional:  Negative for chills and fever.  HENT:  Negative for ear pain and tinnitus.   Eyes:  Negative for pain.  Respiratory:  Negative for cough, shortness of breath and wheezing.   Cardiovascular:  Negative for chest pain, palpitations and leg swelling.  Gastrointestinal:  Negative for abdominal pain, blood in stool, constipation and diarrhea.  Genitourinary:  Negative for dysuria and hematuria.  Musculoskeletal:  Negative for back pain, gait problem and myalgias.  Skin:  Negative for rash.  Neurological:  Negative for dizziness, weakness and headaches.  Psychiatric/Behavioral:  Negative for suicidal ideas.   All other systems reviewed and are negative.  Per HPI unless specifically indicated above   Allergies as of 06/10/2021   No Known Allergies      Medication List        Accurate as of June 10, 2021  4:20 PM. If you have any questions, ask your nurse or doctor.          apixaban 5 MG Tabs tablet Commonly known as: Eliquis TAKE (1) TABLET TWICE A DAY.   atorvastatin 20 MG tablet Commonly known as: LIPITOR Take 1 tablet (20 mg total) by mouth daily.   furosemide 20 MG tablet Commonly known as: LASIX Take 1 tablet (20 mg total) by mouth daily.   GNP PreNatal 28-0.8 MG Tabs TAKE 1 TABLET ONCE DAILY AT NOON   lisinopril-hydrochlorothiazide 20-12.5 MG tablet Commonly known as: ZESTORETIC Take 1 tablet by mouth daily.   pantoprazole 40 MG tablet Commonly known as: PROTONIX Take 1 tablet (40 mg total) by  mouth daily.   tamsulosin 0.4 MG Caps capsule Commonly known as: FLOMAX Take 1 capsule (0.4 mg total) by mouth daily after supper.   Vitamin D3 50 MCG (2000 UT) Tabs Take 1 tablet by mouth daily.         Objective:   BP 130/84   Pulse (!) 118   Ht 5' 7"  (1.702 m)   Wt 170 lb (77.1 kg)   SpO2 96%   BMI 26.63 kg/m   Wt Readings from Last 3 Encounters:  06/10/21 170 lb (77.1 kg)   11/26/20 175 lb (79.4 kg)  05/14/20 169 lb (76.7 kg)    Physical Exam Vitals and nursing note reviewed.  Constitutional:      General: He is not in acute distress.    Appearance: He is well-developed. He is not diaphoretic.  Eyes:     General: No scleral icterus.    Conjunctiva/sclera: Conjunctivae normal.  Neck:     Thyroid: No thyromegaly.  Cardiovascular:     Rate and Rhythm: Normal rate and regular rhythm.     Heart sounds: Normal heart sounds. No murmur heard. Pulmonary:     Effort: Pulmonary effort is normal. No respiratory distress.     Breath sounds: Normal breath sounds. No wheezing.  Abdominal:     General: Abdomen is flat. Bowel sounds are normal. There is no distension.     Tenderness: There is no abdominal tenderness.  Musculoskeletal:        General: No swelling. Normal range of motion.     Cervical back: Neck supple.  Lymphadenopathy:     Cervical: No cervical adenopathy.  Skin:    General: Skin is warm and dry.     Findings: No rash.  Neurological:     Mental Status: He is alert and oriented to person, place, and time.     Coordination: Coordination normal.  Psychiatric:        Behavior: Behavior normal.      Assessment & Plan:   Problem List Items Addressed This Visit       Cardiovascular and Mediastinum   Essential hypertension   Relevant Orders   Cologuard   CBC with Differential/Platelet   CMP14+EGFR   Lipid panel   Bayer DCA Hb A1c Waived   Vitamin B12     Other   Hyperlipidemia   Relevant Orders   Cologuard   CBC with Differential/Platelet   CMP14+EGFR   Lipid panel   Bayer DCA Hb A1c Waived   Vitamin B12   Vitamin B12 deficiency   Relevant Orders   Vitamin B12   Other Visit Diagnoses     Colon cancer screening    -  Primary   Relevant Orders   Cologuard       We will send Cologuard for him, blood pressure looks good, will check blood work today.  No medication change, seems to be doing well. Follow up plan: Return  in about 6 months (around 12/11/2021), or if symptoms worsen or fail to improve, for Hyperlipidemia and hypertension and prediabetes.  Counseling provided for all of the vaccine components Orders Placed This Encounter  Procedures   Cologuard   CBC with Differential/Platelet   CMP14+EGFR   Lipid panel   Bayer DCA Hb A1c Waived   Vitamin B12    Caryl Pina, MD [facility] 06/10/2021, 4:20 PM

## 2021-06-11 LAB — CBC WITH DIFFERENTIAL/PLATELET
Basophils Absolute: 0.1 10*3/uL (ref 0.0–0.2)
Basos: 1 %
EOS (ABSOLUTE): 0.1 10*3/uL (ref 0.0–0.4)
Eos: 2 %
Hematocrit: 43.9 % (ref 37.5–51.0)
Hemoglobin: 14.3 g/dL (ref 13.0–17.7)
Immature Grans (Abs): 0 10*3/uL (ref 0.0–0.1)
Immature Granulocytes: 0 %
Lymphocytes Absolute: 1.3 10*3/uL (ref 0.7–3.1)
Lymphs: 18 %
MCH: 28.5 pg (ref 26.6–33.0)
MCHC: 32.6 g/dL (ref 31.5–35.7)
MCV: 88 fL (ref 79–97)
Monocytes Absolute: 0.3 10*3/uL (ref 0.1–0.9)
Monocytes: 4 %
Neutrophils Absolute: 5.4 10*3/uL (ref 1.4–7.0)
Neutrophils: 75 %
Platelets: 238 10*3/uL (ref 150–450)
RBC: 5.02 x10E6/uL (ref 4.14–5.80)
RDW: 13.8 % (ref 11.6–15.4)
WBC: 7.1 10*3/uL (ref 3.4–10.8)

## 2021-06-11 LAB — CMP14+EGFR
ALT: 10 IU/L (ref 0–44)
AST: 20 IU/L (ref 0–40)
Albumin/Globulin Ratio: 1.8 (ref 1.2–2.2)
Albumin: 4.9 g/dL — ABNORMAL HIGH (ref 3.7–4.7)
Alkaline Phosphatase: 104 IU/L (ref 44–121)
BUN/Creatinine Ratio: 12 (ref 10–24)
BUN: 17 mg/dL (ref 8–27)
Bilirubin Total: 1 mg/dL (ref 0.0–1.2)
CO2: 20 mmol/L (ref 20–29)
Calcium: 9.7 mg/dL (ref 8.6–10.2)
Chloride: 100 mmol/L (ref 96–106)
Creatinine, Ser: 1.46 mg/dL — ABNORMAL HIGH (ref 0.76–1.27)
Globulin, Total: 2.8 g/dL (ref 1.5–4.5)
Glucose: 130 mg/dL — ABNORMAL HIGH (ref 65–99)
Potassium: 4 mmol/L (ref 3.5–5.2)
Sodium: 142 mmol/L (ref 134–144)
Total Protein: 7.7 g/dL (ref 6.0–8.5)
eGFR: 51 mL/min/{1.73_m2} — ABNORMAL LOW (ref >59)

## 2021-06-11 LAB — LIPID PANEL
Chol/HDL Ratio: 4.5 ratio (ref 0.0–5.0)
Cholesterol, Total: 139 mg/dL (ref 100–199)
HDL: 31 mg/dL — ABNORMAL LOW (ref >39)
LDL Chol Calc (NIH): 85 mg/dL (ref 0–99)
Triglycerides: 125 mg/dL (ref 0–149)
VLDL Cholesterol Cal: 23 mg/dL (ref 5–40)

## 2021-06-11 LAB — VITAMIN B12: Vitamin B-12: 137 pg/mL — ABNORMAL LOW (ref 232–1245)

## 2021-07-12 LAB — COLOGUARD

## 2021-12-03 ENCOUNTER — Telehealth: Payer: Self-pay

## 2021-12-03 NOTE — Telephone Encounter (Signed)
Unable to reach patient for AWV. Tried both home and cell numbers. Left messages at both numbers with no return call. Mjp,lpn

## 2021-12-05 ENCOUNTER — Ambulatory Visit (INDEPENDENT_AMBULATORY_CARE_PROVIDER_SITE_OTHER): Payer: Medicare HMO

## 2021-12-05 VITALS — Wt 170.0 lb

## 2021-12-05 DIAGNOSIS — Z0000 Encounter for general adult medical examination without abnormal findings: Secondary | ICD-10-CM | POA: Diagnosis not present

## 2021-12-05 NOTE — Patient Instructions (Signed)
Edwin [name] , Thank you for taking time to come for your Medicare Wellness Visit. I appreciate your ongoing commitment to your health goals. Please review the following plan we discussed and let me know if I can assist you in the future.   Screening recommendations/referrals: Colonoscopy: Cologuard done at home 07/08/2021 - repeat in 3 years Recommended yearly ophthalmology/optometry visit for glaucoma screening and checkup Recommended yearly dental visit for hygiene and checkup  Vaccinations: Influenza vaccine: Declined Pneumococcal vaccine: Patient declined  Tdap vaccine: Patient declined  Shingles vaccine: Patient declined    Covid-19: Patient declined   Advanced directives: Please bring a copy of your health care power of attorney and living will to the office to be added to your chart at your convenience.   Conditions/risks identified: Aim for 30 minutes of exercise or brisk walking each day, drink 6-8 glasses of water and eat lots of fruits and vegetables.   Next appointment: Follow up in one year for your annual wellness visit.   Preventive Care 34 Years and Older, Male  Preventive care refers to lifestyle choices and visits with your health care provider that can promote health and wellness. What does preventive care include? A yearly physical exam. This is also called an annual well check. Dental exams once or twice a year. Routine eye exams. Ask your health care provider how often you should have your eyes checked. Personal lifestyle choices, including: Daily care of your teeth and gums. Regular physical activity. Eating a healthy diet. Avoiding tobacco and drug use. Limiting alcohol use. Practicing safe sex. Taking low doses of aspirin every day. Taking vitamin and mineral supplements as recommended by your health care provider. What happens during an annual well check? The services and screenings done by your health care provider during your annual well check will  depend on your age, overall health, lifestyle risk factors, and family history of disease. Counseling  Your health care provider may ask you questions about your: Alcohol use. Tobacco use. Drug use. Emotional well-being. Home and relationship well-being. Sexual activity. Eating habits. History of falls. Memory and ability to understand (cognition). Work and work Statistician. Screening  You may have the following tests or measurements: Height, weight, and BMI. Blood pressure. Lipid and cholesterol levels. These may be checked every 5 years, or more frequently if you are over 53 years old. Skin check. Lung cancer screening. You may have this screening every year starting at age 1 if you have a 30-pack-year history of smoking and currently smoke or have quit within the past 15 years. Fecal occult blood test (FOBT) of the stool. You may have this test every year starting at age 76. Flexible sigmoidoscopy or colonoscopy. You may have a sigmoidoscopy every 5 years or a colonoscopy every 10 years starting at age 51. Prostate cancer screening. Recommendations will vary depending on your family history and other risks. Hepatitis C blood test. Hepatitis B blood test. Sexually transmitted disease (STD) testing. Diabetes screening. This is done by checking your blood sugar (glucose) after you have not eaten for a while (fasting). You may have this done every 1-3 years. Abdominal aortic aneurysm (AAA) screening. You may need this if you are a current or former smoker. Osteoporosis. You may be screened starting at age 74 if you are at high risk. Talk with your health care provider about your test results, treatment options, and if necessary, the need for more tests. Vaccines  Your health care provider may recommend certain vaccines, such as: Influenza  vaccine. This is recommended every year. Tetanus, diphtheria, and acellular pertussis (Tdap, Td) vaccine. You may need a Td booster every 10  years. Zoster vaccine. You may need this after age 41. Pneumococcal 13-valent conjugate (PCV13) vaccine. One dose is recommended after age 77. Pneumococcal polysaccharide (PPSV23) vaccine. One dose is recommended after age 32. Talk to your health care provider about which screenings and vaccines you need and how often you need them. This information is not intended to replace advice given to you by your health care provider. Make sure you discuss any questions you have with your health care provider. Document Released: 11/08/2015 Document Revised: 07/01/2016 Document Reviewed: 08/13/2015 Elsevier Interactive Patient Education  2017 [employer] Prevention in the Home Falls can cause injuries. They can happen to people of all ages. There are many things you can do to make your home safe and to help prevent falls. What can I do on the outside of my home? Regularly fix the edges of walkways and driveways and fix any cracks. Remove anything that might make you trip as you walk through a door, such as a raised step or threshold. Trim any bushes or trees on the path to your home. Use bright outdoor lighting. Clear any walking paths of anything that might make someone trip, such as rocks or tools. Regularly check to see if handrails are loose or broken. Make sure that both sides of any steps have handrails. Any raised decks and porches should have guardrails on the edges. Have any leaves, snow, or ice cleared regularly. Use sand or salt on walking paths during winter. Clean up any spills in your garage right away. This includes oil or grease spills. What can I do in the bathroom? Use night lights. Install grab bars by the toilet and in the tub and shower. Do not use towel bars as grab bars. Use non-skid mats or decals in the tub or shower. If you need to sit down in the shower, use a plastic, non-slip stool. Keep the floor dry. Clean up any water that spills on the floor as soon as it  happens. Remove soap buildup in the tub or shower regularly. Attach bath mats securely with double-sided non-slip rug tape. Do not have throw rugs and other things on the floor that can make you trip. What can I do in the bedroom? Use night lights. Make sure that you have a light by your bed that is easy to reach. Do not use any sheets or blankets that are too big for your bed. They should not hang down onto the floor. Have a firm chair that has side arms. You can use this for support while you get dressed. Do not have throw rugs and other things on the floor that can make you trip. What can I do in the kitchen? Clean up any spills right away. Avoid walking on wet floors. Keep items that you use a lot in easy-to-reach places. If you need to reach something above you, use a strong step stool that has a grab bar. Keep electrical cords out of the way. Do not use floor polish or wax that makes floors slippery. If you must use wax, use non-skid floor wax. Do not have throw rugs and other things on the floor that can make you trip. What can I do with my stairs? Do not leave any items on the stairs. Make sure that there are handrails on both sides of the stairs and use them. Fix  handrails that are broken or loose. Make sure that handrails are as long as the stairways. Check any carpeting to make sure that it is firmly attached to the stairs. Fix any carpet that is loose or worn. Avoid having throw rugs at the top or bottom of the stairs. If you do have throw rugs, attach them to the floor with carpet tape. Make sure that you have a light switch at the top of the stairs and the bottom of the stairs. If you do not have them, ask someone to add them for you. What else can I do to help prevent falls? Wear shoes that: Do not have high heels. Have rubber bottoms. Are comfortable and fit you well. Are closed at the toe. Do not wear sandals. If you use a stepladder: Make sure that it is fully opened.  Do not climb a closed stepladder. Make sure that both sides of the stepladder are locked into place. Ask someone to hold it for you, if possible. Clearly mark and make sure that you can see: Any grab bars or handrails. First and last steps. Where the edge of each step is. Use tools that help you move around (mobility aids) if they are needed. These include: Canes. Walkers. Scooters. Crutches. Turn on the lights when you go into a dark area. Replace any light bulbs as soon as they burn out. Set up your furniture so you have a clear path. Avoid moving your furniture around. If any of your floors are uneven, fix them. If there are any pets around you, be aware of where they are. Review your medicines with your doctor. Some medicines can make you feel dizzy. This can increase your chance of falling. Ask your doctor what other things that you can do to help prevent falls. This information is not intended to replace advice given to you by your health care provider. Make sure you discuss any questions you have with your health care provider. Document Released: 08/08/2009 Document Revised: 03/19/2016 Document Reviewed: 11/16/2014 Elsevier Interactive Patient Education  2017 [employer].

## 2021-12-05 NOTE — Progress Notes (Addendum)
Subjective:   Edwin [name] is a 71 y.o. male who presents for Medicare Annual/Subsequent preventive examination.  Virtual Visit via Telephone Note  I connected with  Aurea Graff on 12/05/21 at  1:15 PM EST by telephone and verified that I am speaking with the correct person using two identifiers.  Location: Patient: Home Provider: [facility] Persons participating in the virtual visit: patient/Nurse Health Advisor   I discussed the limitations, risks, security and privacy concerns of performing an evaluation and management service by telephone and the availability of in person appointments. The patient expressed understanding and agreed to proceed.  Interactive audio and video telecommunications were attempted between this nurse and patient, however failed, due to patient having technical difficulties OR patient did not have access to video capability.  We continued and completed visit with audio only.  Some vital signs may be absent or patient reported.   Connie Lasater E Ruairi Stutsman, LPN   Review of Systems     Cardiac Risk Factors include: advanced age (>58men, >70 women);male gender;smoking/ tobacco exposure;sedentary lifestyle;dyslipidemia;hypertension;Other (see comment), Risk factor comments: pre-diabetes, hx of recurrent DVT, megaloblastic anemia     Objective:    Today's Vitals   12/05/21 1325  Weight: 170 lb (77.1 kg)   Body mass index is 26.63 kg/m.  Advanced Directives 12/05/2021 10/30/2020 12/02/2017 07/16/2015 07/09/2015 07/03/2015 06/17/2015  Does Patient Have a Medical Advance Directive? Yes No No Yes Yes Yes No;Yes  Type of Advance Directive Healthcare Power of [place] in Chart? No - copy requested - - - Yes Yes Yes  Would patient like information on creating a medical advance directive? - No - Patient declined No - Patient declined  - - - -    Current Medications (verified) Outpatient Encounter Medications as of 12/05/2021  Medication Sig   apixaban (ELIQUIS) 5 MG TABS tablet TAKE (1) TABLET TWICE A DAY.   atorvastatin (LIPITOR) 20 MG tablet Take 1 tablet (20 mg total) by mouth daily.   Cholecalciferol (VITAMIN D3) 2000 UNITS TABS Take 1 tablet by mouth daily.   furosemide (LASIX) 20 MG tablet Take 1 tablet (20 mg total) by mouth daily.   lisinopril-hydrochlorothiazide (ZESTORETIC) 20-12.5 MG tablet Take 1 tablet by mouth daily.   pantoprazole (PROTONIX) 40 MG tablet Take 1 tablet (40 mg total) by mouth daily.   Prenatal Vit-Fe Fumarate-FA (GNP PRENATAL) 28-0.8 MG TABS TAKE 1 TABLET ONCE DAILY AT NOON   tamsulosin (FLOMAX) 0.4 MG CAPS capsule Take 1 capsule (0.4 mg total) by mouth daily after supper.   No facility-administered encounter medications on file as of 12/05/2021.    Allergies (verified) Patient has no known allergies.   History: Past Medical History:  Diagnosis Date   Acute encephalopathy 08/14/2014   Anemia    nutritional anemia   Anemia    posthemorrhagic anemia   Bladder calculus 10/06/2014   Blood dyscrasia    pancytopenia   BPH (benign prostatic hyperplasia) 01/31/2015   DVT (deep venous thrombosis) ([place]) [id number]   left leg   Dysphagia 10/11/14   Encephalopathy    Essential hypertension 01/31/2015   Failure to thrive in adult 08/22/14   Hx of sepsis 12/15   Megaloblastic anemia due to B12 deficiency 08/15/2014   Neuromuscular disorder ([place])    muscle weakness   Pancytopenia ([place]) 08/14/2014   Peripheral vascular disease ([place]) 10/11/14   acute  embolism and thrombosis Rt femoral vein   Renal disorder    Bladder stone,Bladder diverticulm   Right leg DVT ([place]) 08/14/2014   R. femoral vein   S/P IVC filter 10/05/2014   Sec to hematuria on Eliquis   Sepsis ([place]) 10/05/2014   UTI (lower urinary tract infection)    Past Surgical History:  Procedure Laterality Date   CYSTOSCOPY N/A  08/21/2014   Procedure: CYSTOSCOPY FLEXIBLE;  Surgeon: Jorja Loa, MD;  Location: [facility];  Service: Urology;  Laterality: N/A;  at bedside   CYSTOSCOPY WITH LITHOLAPAXY N/A 11/22/2014   Procedure: CYSTOSCOPY WITH LITHOLAPAXY;  Surgeon: Jorja Loa, MD;  Location: [facility];  Service: Urology;  Laterality: N/A;   TRANSURETHRAL INCISION OF PROSTATE N/A 11/22/2014   Procedure: TRANSURETHRAL INCISION OF THE PROSTATE (TUIP);  Surgeon: Jorja Loa, MD;  Location: [facility];  Service: Urology;  Laterality: N/A;   Family History  Problem Relation Age of Onset   Alzheimer's disease Mother 59   Congenital Murmur Father    Cancer Sister    Social History   Socioeconomic History   Marital status: Single    Spouse name: Not on file   Number of children: 1   Years of education: 12   Highest education level: High school graduate  Occupational History   Occupation: Retired    Comment: Charity fundraiser  Tobacco Use   Smoking status: Light Smoker    Types: Cigars   Smokeless tobacco: Never  Vaping Use   Vaping Use: Never used  Substance and Sexual Activity   Alcohol use: No    Alcohol/week: 0.0 standard drinks   Drug use: No   Sexual activity: Not Currently  Other Topics Concern   Not on file  Social History Narrative   Patient lives alone in a ground floor apartment. He is retired from the [employer]. He has one son.        Social Determinants of Health   Financial Resource Strain: Low Risk    Difficulty of Paying Living Expenses: Not hard at all  Food Insecurity: No Food Insecurity   Worried About Charity fundraiser in the Last Year: Never true   [place] in the Last Year: Never true  Transportation Needs: No Transportation Needs   Lack of Transportation (Medical): No   Lack of Transportation (Non-Medical): No  Physical Activity: Insufficiently Active   Days of Exercise per Week: 7 days   Minutes of Exercise per Session: 10 min  Stress: No Stress Concern  Present   Feeling of Stress : Not at all  Social Connections: Socially Isolated   Frequency of Communication with Friends and Family: More than three times a week   Frequency of Social Gatherings with Friends and Family: More than three times a week   Attends Religious Services: Never   Marine scientist or Organizations: No   Attends Music therapist: Never   Marital Status: Never married    Tobacco Counseling Ready to quit: Not Answered Counseling given: Not Answered   Clinical Intake:  Pre-visit preparation completed: Yes  Pain : No/denies pain     BMI - recorded: 26.63 Nutritional Status: BMI 25 -29 Overweight Nutritional Risks: None Diabetes: No  How often do you need to have someone help you when you read instructions, pamphlets, or other written materials from your doctor or pharmacy?: 1 - Never  Diabetic? no  Interpreter Needed?: No  Information entered by :: Brailey Buescher,  LPN   Activities of Daily Living In your present state of health, do you have any difficulty performing the following activities: 12/05/2021  Hearing? N  Vision? N  Difficulty concentrating or making decisions? N  Walking or climbing stairs? N  Dressing or bathing? N  Doing errands, shopping? N  Preparing Food and eating ? N  Using the Toilet? N  In the past six months, have you accidently leaked urine? N  Do you have problems with loss of bowel control? N  Managing your Medications? N  Managing your Finances? N  Housekeeping or managing your Housekeeping? N  Some recent data might be hidden    Patient Care Team: Dettinger, Fransisca Kaufmann, MD as PCP - General (Family Medicine) Franchot Gallo, MD as Consulting Physician (Urology) Phillips Odor, MD as Consulting Physician (Neurology)  Indicate any recent Medical Services you may have received from other than [facility] providers in the past year (date may be approximate).     Assessment:   This is a routine wellness  examination for Mehdi.  Hearing/Vision screen Hearing Screening - Comments:: Denies hearing difficulties   Vision Screening - Comments:: Wears rx glasses - behind on eye exams with Dr Marin Comment in [place]  Dietary issues and exercise activities discussed: Current Exercise Habits: Home exercise routine, Type of exercise: walking, Time (Minutes): 10, Frequency (Times/Week): 7, Weekly Exercise (Minutes/Week): 70, Intensity: Mild, Exercise limited by: None identified   Goals Addressed             This Visit's Progress    DIET - INCREASE WATER INTAKE   On track    Exercise 150 min/wk Moderate Activity   Not on track      Depression Screen [facility] 2/9 Scores 12/05/2021 06/10/2021 11/26/2020 10/30/2020 05/14/2020 08/17/2018 02/03/2018  PHQ - 2 Score 0 0 0 0 0 0 0  PHQ- 9 Score - 0 - - - - -    Fall Risk Fall Risk  12/05/2021 06/10/2021 11/26/2020 10/30/2020 05/14/2020  Falls in the past year? 0 0 0 0 0  Number falls in past yr: 0 - - - -  Injury with Fall? 0 - - - -  Risk for fall due to : No Fall Risks - - No Fall Risks -  Follow up Falls prevention discussed - - Falls evaluation completed -    FALL RISK PREVENTION PERTAINING TO THE HOME:  Any stairs in or around the home? No  If so, are there any without handrails? No  Home free of loose throw rugs in walkways, pet beds, electrical cords, etc? Yes  Adequate lighting in your home to reduce risk of falls? Yes   ASSISTIVE DEVICES UTILIZED TO PREVENT FALLS:  Life alert? No  Use of a cane, walker or w/c? No  Grab bars in the bathroom? Yes  Shower chair or bench in shower? Yes  Elevated toilet seat or a handicapped toilet? No   TIMED UP AND GO:  Was the test performed? No . Telephonic visit  Cognitive Function: MMSE - Mini Mental State Exam 12/02/2017  Orientation to time 5  Orientation to Place 5  Registration 3  Attention/ Calculation 5  Recall 3  Language- name 2 objects 2  Language- repeat 1  Language- follow 3 step command 2  Language-  read & follow direction 1  Write a sentence 1  Copy design 1  Total score 29     6CIT Screen 12/05/2021 10/30/2020  What Year? 0 points 0 points  What month?  0 points 0 points  What time? 0 points 0 points  Count back from 20 0 points 0 points  Months in reverse 0 points 4 points  Repeat phrase 6 points 0 points  Total Score 6 4    Immunizations  There is no immunization history on file for this patient.  TDAP status: Due, Education has been provided regarding the importance of this vaccine. Advised may receive this vaccine at local pharmacy or Health Dept. Aware to provide a copy of the vaccination record if obtained from local pharmacy or Health Dept. Verbalized acceptance and understanding.  Flu Vaccine status: Declined, Education has been provided regarding the importance of this vaccine but patient still declined. Advised may receive this vaccine at local pharmacy or Health Dept. Aware to provide a copy of the vaccination record if obtained from local pharmacy or Health Dept. Verbalized acceptance and understanding.  Pneumococcal vaccine status: Declined,  Education has been provided regarding the importance of this vaccine but patient still declined. Advised may receive this vaccine at local pharmacy or Health Dept. Aware to provide a copy of the vaccination record if obtained from local pharmacy or Health Dept. Verbalized acceptance and understanding.   Covid-19 vaccine status: Declined, Education has been provided regarding the importance of this vaccine but patient still declined. Advised may receive this vaccine at local pharmacy or Health Dept.or vaccine clinic. Aware to provide a copy of the vaccination record if obtained from local pharmacy or Health Dept. Verbalized acceptance and understanding.  Qualifies for Shingles Vaccine? Yes   Zostavax completed No   Shingrix Completed?: No.    Education has been provided regarding the importance of this vaccine. Patient has been  advised to call insurance company to determine out of pocket expense if they have not yet received this vaccine. Advised may also receive vaccine at local pharmacy or Health Dept. Verbalized acceptance and understanding.  Screening Tests Health Maintenance  Topic Date Due   Pneumonia Vaccine 73+ Years old (1 - PCV) Never done   Fecal DNA (Cologuard)  Never done   INFLUENZA VACCINE  Never done   TETANUS/TDAP  06/10/2022 (Originally 07/01/1970)   Zoster Vaccines- Shingrix (1 of 2) 09/07/2022 (Originally 07/02/1999)   Hepatitis C Screening  Completed   HPV VACCINES  Aged Out   COVID-19 Vaccine  Discontinued    Health Maintenance  Health Maintenance Due  Topic Date Due   Pneumonia Vaccine 71+ Years old (1 - PCV) Never done   Fecal DNA (Cologuard)  Never done   INFLUENZA VACCINE  Never done    Colorectal cancer screening: Type of screening: Cologuard. Completed 07/08/2021. Repeat every 3 years  Lung Cancer Screening: (Low Dose CT Chest recommended if Age 37-80 years, 30 pack-year currently smoking OR have quit w/in 15years.) does not qualify.   Additional Screening:  Hepatitis C Screening: does not qualify; Completed 08/15/2014  Vision Screening: Recommended annual ophthalmology exams for early detection of glaucoma and other disorders of the eye. Is the patient up to date with their annual eye exam?  No  Who is the provider or what is the name of the office in which the patient attends annual eye exams? [facility] If pt is not established with a provider, would they like to be referred to a provider to establish care? No .   Dental Screening: Recommended annual dental exams for proper oral hygiene  Community Resource Referral / Chronic Care Management: CRR required this visit?  No   CCM required this  visit?  No      Plan:     I have personally reviewed and noted the following in the patients chart:   Medical and social history Use of alcohol, tobacco or illicit drugs  Current  medications and supplements including opioid prescriptions. Patient is not currently taking opioid prescriptions. Functional ability and status Nutritional status Physical activity Advanced directives List of other physicians Hospitalizations, surgeries, and ER visits in previous 12 months Vitals Screenings to include cognitive, depression, and falls Referrals and appointments  In addition, I have reviewed and discussed with patient certain preventive protocols, quality metrics, and best practice recommendations. A written personalized care plan for preventive services as well as general preventive health recommendations were provided to patient.   Due to this being a telephonic visit, the after visit summary with patients personalized plan was offered to patient via mail or my-chart. Patient declined at this time  Sandrea Hammond, LPN   [id number]   Nurse Notes: He couldn't tell me what medications he is taking - said "they have funny names" I offered to name them and he just make sure he has them, but he said he couldn't do that. 6CIT = 6 - maybe check MMSE in office and/or do CCM referral (he refused this over the phone)

## 2021-12-10 ENCOUNTER — Ambulatory Visit: Payer: Medicare HMO | Admitting: Family Medicine

## 2022-01-09 ENCOUNTER — Encounter: Payer: Self-pay | Admitting: Family Medicine

## 2022-01-09 ENCOUNTER — Ambulatory Visit (INDEPENDENT_AMBULATORY_CARE_PROVIDER_SITE_OTHER): Payer: Medicare HMO | Admitting: Family Medicine

## 2022-01-09 VITALS — BP 137/82 | HR 120 | Ht 67.0 in | Wt 174.0 lb

## 2022-01-09 DIAGNOSIS — N1832 Chronic kidney disease, stage 3b: Secondary | ICD-10-CM

## 2022-01-09 DIAGNOSIS — R7303 Prediabetes: Secondary | ICD-10-CM

## 2022-01-09 DIAGNOSIS — E782 Mixed hyperlipidemia: Secondary | ICD-10-CM

## 2022-01-09 DIAGNOSIS — I82409 Acute embolism and thrombosis of unspecified deep veins of unspecified lower extremity: Secondary | ICD-10-CM | POA: Diagnosis not present

## 2022-01-09 DIAGNOSIS — I10 Essential (primary) hypertension: Secondary | ICD-10-CM | POA: Diagnosis not present

## 2022-01-09 LAB — BAYER DCA HB A1C WAIVED: HB A1C (BAYER DCA - WAIVED): 5.8 % — ABNORMAL HIGH (ref 4.8–5.6)

## 2022-01-09 MED ORDER — PANTOPRAZOLE SODIUM 40 MG PO TBEC: 40.0000 mg | DELAYED_RELEASE_TABLET | Freq: Every day | ORAL | 3 refills | Status: DC

## 2022-01-09 MED ORDER — TAMSULOSIN HCL 0.4 MG PO CAPS: 0.4000 mg | ORAL_CAPSULE | Freq: Every day | ORAL | 3 refills | Status: DC

## 2022-01-09 MED ORDER — ATORVASTATIN CALCIUM 20 MG PO TABS: 20.0000 mg | ORAL_TABLET | Freq: Every day | ORAL | 3 refills | Status: DC

## 2022-01-09 MED ORDER — APIXABAN 5 MG PO TABS: ORAL_TABLET | ORAL | 3 refills | Status: DC

## 2022-01-09 MED ORDER — LISINOPRIL-HYDROCHLOROTHIAZIDE 20-12.5 MG PO TABS: 1.0000 | ORAL_TABLET | Freq: Every day | ORAL | 3 refills | Status: DC

## 2022-01-09 MED ORDER — FUROSEMIDE 20 MG PO TABS: 20.0000 mg | ORAL_TABLET | Freq: Every day | ORAL | 3 refills | Status: DC

## 2022-01-09 NOTE — Progress Notes (Signed)
? ?BP 137/82   Pulse (!) 120   Ht 5' 7" (1.702 m)   Wt 174 lb (78.9 kg)   SpO2 98%   BMI 27.25 kg/m?   ? ?Subjective:  ? ?Patient ID: Edwin [name], male    DOB: [birth year], 71 y.o.   MRN: [id number] ? ?HPI: ?Edwin [name] is a 71 y.o. male presenting on 01/09/2022 for Medical Management of Chronic Issues and Hypertension ? ? ?HPI ?Hypertension ?Patient is currently on lisinopril hydrochlorothiazide and furosemide, and their blood pressure today is 137/82. Patient denies any lightheadedness or dizziness. Patient denies headaches, blurred vision, chest pains, shortness of breath, or weakness. Denies any side effects from medication and is content with current medication.  ? ?Prediabetes  ?patient comes in today for recheck of his diabetes. Patient has been currently taking no medication currently. Patient is currently on an ACE inhibitor/ARB. Patient has not seen an ophthalmologist this year. Patient denies any issues with their feet. The symptom started onset as an adult hypertension and CKD ARE RELATED TO DM  ? ?Recurrent DVTs, patient is on Eliquis for recurrent DVTs ?He takes Eliquis and denies any bleeding or blood in stool ? ?CKD 3 ?We will recheck levels today but he denies any urinary difficulties or issues. ? ?Relevant past medical, surgical, family and social history reviewed and updated as indicated. Interim medical history since our last visit reviewed. ?Allergies and medications reviewed and updated. ? ?Review of Systems  ?Constitutional:  Negative for chills and fever.  ?Eyes:  Negative for discharge.  ?Respiratory:  Negative for shortness of breath and wheezing.   ?Cardiovascular:  Negative for chest pain and leg swelling.  ?Gastrointestinal:  Negative for anal bleeding and blood in stool.  ?Genitourinary:  Negative for hematuria.  ?Musculoskeletal:  Negative for arthralgias, back pain and gait problem.  ?Skin:  Negative for rash.  ?All other systems reviewed and are negative. ? ?Per HPI unless  specifically indicated above ? ? ?Allergies as of 01/09/2022   ?No Known Allergies ?  ? ?  ?Medication List  ?  ? ?  ? Accurate as of January 09, 2022  2:51 PM. If you have any questions, ask your nurse or doctor.  ?  ?  ? ?  ? ?apixaban 5 MG Tabs tablet ?Commonly known as: Eliquis ?TAKE (1) TABLET TWICE A DAY. ?  ?atorvastatin 20 MG tablet ?Commonly known as: LIPITOR ?Take 1 tablet (20 mg total) by mouth daily. ?  ?furosemide 20 MG tablet ?Commonly known as: LASIX ?Take 1 tablet (20 mg total) by mouth daily. ?  ?GNP PreNatal 28-0.8 MG Tabs ?TAKE 1 TABLET ONCE DAILY AT NOON ?  ?lisinopril-hydrochlorothiazide 20-12.5 MG tablet ?Commonly known as: ZESTORETIC ?Take 1 tablet by mouth daily. ?  ?pantoprazole 40 MG tablet ?Commonly known as: PROTONIX ?Take 1 tablet (40 mg total) by mouth daily. ?  ?tamsulosin 0.4 MG Caps capsule ?Commonly known as: FLOMAX ?Take 1 capsule (0.4 mg total) by mouth daily after supper. ?  ?Vitamin D3 50 MCG (2000 UT) Tabs ?Take 1 tablet by mouth daily. ?  ? ?  ? ? ? ?Objective:  ? ?BP 137/82   Pulse (!) 120   Ht 5' 7" (1.702 m)   Wt 174 lb (78.9 kg)   SpO2 98%   BMI 27.25 kg/m?   ?Wt Readings from Last 3 Encounters:  ?01/09/22 174 lb (78.9 kg)  ?12/05/21 170 lb (77.1 kg)  ?06/10/21 170 lb (77.1 kg)  ?  ?Physical Exam ?Vitals and  nursing note reviewed.  ?Constitutional:   ?   General: He is not in acute distress. ?   Appearance: He is well-developed. He is not diaphoretic.  ?Eyes:  ?   General: No scleral icterus. ?   Conjunctiva/sclera: Conjunctivae normal.  ?Neck:  ?   Thyroid: No thyromegaly.  ?Cardiovascular:  ?   Rate and Rhythm: Regular rhythm. Tachycardia present.  ?   Heart sounds: Normal heart sounds. No murmur heard. ?Pulmonary:  ?   Effort: Pulmonary effort is normal. No respiratory distress.  ?   Breath sounds: Normal breath sounds. No wheezing.  ?Musculoskeletal:     ?   General: No swelling. Normal range of motion.  ?   Cervical back: Neck supple.  ?Lymphadenopathy:  ?    Cervical: No cervical adenopathy.  ?Skin: ?   General: Skin is warm and dry.  ?   Findings: No rash.  ?Neurological:  ?   Mental Status: He is alert and oriented to person, place, and time.  ?   Coordination: Coordination normal.  ?Psychiatric:     ?   Behavior: Behavior normal.  ? ? ? ? ?Assessment & Plan:  ? ?Problem List Items Addressed This Visit   ? ?  ? Cardiovascular and Mediastinum  ? Essential hypertension - Primary  ? Relevant Medications  ? apixaban (ELIQUIS) 5 MG TABS tablet  ? atorvastatin (LIPITOR) 20 MG tablet  ? furosemide (LASIX) 20 MG tablet  ? lisinopril-hydrochlorothiazide (ZESTORETIC) 20-12.5 MG tablet  ? Other Relevant Orders  ? CBC with Differential/Platelet  ? CMP14+EGFR  ? Recurrent deep vein thrombosis (DVT) (HCC)  ? Relevant Medications  ? apixaban (ELIQUIS) 5 MG TABS tablet  ? atorvastatin (LIPITOR) 20 MG tablet  ? furosemide (LASIX) 20 MG tablet  ? lisinopril-hydrochlorothiazide (ZESTORETIC) 20-12.5 MG tablet  ?  ? Genitourinary  ? CKD (chronic kidney disease) stage 3, GFR 30-59 ml/min (HCC)  ?  ? Other  ? Prediabetes  ? Relevant Orders  ? CMP14+EGFR  ? Bayer DCA Hb A1c Waived  ? Hyperlipidemia  ? Relevant Medications  ? apixaban (ELIQUIS) 5 MG TABS tablet  ? atorvastatin (LIPITOR) 20 MG tablet  ? furosemide (LASIX) 20 MG tablet  ? lisinopril-hydrochlorothiazide (ZESTORETIC) 20-12.5 MG tablet  ? Other Relevant Orders  ? Lipid panel  ?  ?Continue current medicine, no changes.  We will check blood work. ?Follow up plan: ?Return in about 6 months (around 07/12/2022), or if symptoms worsen or fail to improve, for Hypertension and prediabetes and cholesterol recheck. ? ?Counseling provided for all of the vaccine components ?Orders Placed This Encounter  ?Procedures  ? CBC with Differential/Platelet  ? CMP14+EGFR  ? Lipid panel  ? Bayer DCA Hb A1c Waived  ? ? ?Caryl Pina, MD ?[place] ?01/09/2022, 2:51 PM ? ? ? ? ?

## 2022-01-10 LAB — CMP14+EGFR
ALT: 15 IU/L (ref 0–44)
AST: 20 IU/L (ref 0–40)
Albumin/Globulin Ratio: 1.7 (ref 1.2–2.2)
Albumin: 4.8 g/dL — ABNORMAL HIGH (ref 3.7–4.7)
Alkaline Phosphatase: 89 IU/L (ref 44–121)
BUN/Creatinine Ratio: 13 (ref 10–24)
BUN: 18 mg/dL (ref 8–27)
Bilirubin Total: 0.9 mg/dL (ref 0.0–1.2)
CO2: 25 mmol/L (ref 20–29)
Calcium: 10.5 mg/dL — ABNORMAL HIGH (ref 8.6–10.2)
Chloride: 100 mmol/L (ref 96–106)
Creatinine, Ser: 1.38 mg/dL — ABNORMAL HIGH (ref 0.76–1.27)
Globulin, Total: 2.9 g/dL (ref 1.5–4.5)
Glucose: 126 mg/dL — ABNORMAL HIGH (ref 70–99)
Potassium: 4.3 mmol/L (ref 3.5–5.2)
Sodium: 139 mmol/L (ref 134–144)
Total Protein: 7.7 g/dL (ref 6.0–8.5)
eGFR: 54 mL/min/{1.73_m2} — ABNORMAL LOW (ref >59)

## 2022-01-10 LAB — CBC WITH DIFFERENTIAL/PLATELET
Basophils Absolute: 0.1 10*3/uL (ref 0.0–0.2)
Basos: 1 %
EOS (ABSOLUTE): 0.2 10*3/uL (ref 0.0–0.4)
Eos: 3 %
Hematocrit: 41.1 % (ref 37.5–51.0)
Hemoglobin: 13.9 g/dL (ref 13.0–17.7)
Immature Grans (Abs): 0 10*3/uL (ref 0.0–0.1)
Immature Granulocytes: 0 %
Lymphocytes Absolute: 1.5 10*3/uL (ref 0.7–3.1)
Lymphs: 21 %
MCH: 29.8 pg (ref 26.6–33.0)
MCHC: 33.8 g/dL (ref 31.5–35.7)
MCV: 88 fL (ref 79–97)
Monocytes Absolute: 0.3 10*3/uL (ref 0.1–0.9)
Monocytes: 4 %
Neutrophils Absolute: 5.2 10*3/uL (ref 1.4–7.0)
Neutrophils: 71 %
Platelets: 269 10*3/uL (ref 150–450)
RBC: 4.67 x10E6/uL (ref 4.14–5.80)
RDW: 13.3 % (ref 11.6–15.4)
WBC: 7.3 10*3/uL (ref 3.4–10.8)

## 2022-01-10 LAB — LIPID PANEL
Chol/HDL Ratio: 5.1 ratio — ABNORMAL HIGH (ref 0.0–5.0)
Cholesterol, Total: 163 mg/dL (ref 100–199)
HDL: 32 mg/dL — ABNORMAL LOW (ref >39)
LDL Chol Calc (NIH): 106 mg/dL — ABNORMAL HIGH (ref 0–99)
Triglycerides: 137 mg/dL (ref 0–149)
VLDL Cholesterol Cal: 25 mg/dL (ref 5–40)

## 2022-04-17 ENCOUNTER — Other Ambulatory Visit: Payer: Self-pay | Admitting: Family Medicine

## 2022-07-13 ENCOUNTER — Encounter: Payer: Self-pay | Admitting: Family Medicine

## 2022-07-13 ENCOUNTER — Ambulatory Visit (INDEPENDENT_AMBULATORY_CARE_PROVIDER_SITE_OTHER): Payer: Medicare HMO | Admitting: Family Medicine

## 2022-07-13 VITALS — BP 133/71 | HR 114 | Temp 98.2°F | Ht 67.0 in | Wt 172.0 lb

## 2022-07-13 DIAGNOSIS — R7303 Prediabetes: Secondary | ICD-10-CM

## 2022-07-13 DIAGNOSIS — E782 Mixed hyperlipidemia: Secondary | ICD-10-CM | POA: Diagnosis not present

## 2022-07-13 DIAGNOSIS — I10 Essential (primary) hypertension: Secondary | ICD-10-CM

## 2022-07-13 DIAGNOSIS — N1832 Chronic kidney disease, stage 3b: Secondary | ICD-10-CM

## 2022-07-13 LAB — BAYER DCA HB A1C WAIVED: HB A1C (BAYER DCA - WAIVED): 5.7 % — ABNORMAL HIGH (ref 4.8–5.6)

## 2022-07-13 NOTE — Progress Notes (Signed)
BP 133/71   Pulse (!) 114   Temp 98.2 F (36.8 C)   Ht _0  (1.702 m)   Wt 172 lb (78 kg)   SpO2 97%   BMI 26.94 kg/m    Subjective:   Patient ID: Edwin [name], male    DOB: [birth year], 71 y.o.   MRN: [id number]  HPI: Edwin [name] is a 71 y.o. male presenting on 07/13/2022 for Medical Management of Chronic Issues, Prediabetes, and Hypertension   HPI Prediabetes Patient comes in today for recheck of his diabetes. Patient has been currently taking no medication currently. Patient is currently on an ACE inhibitor/ARB. Patient has not seen an ophthalmologist this year. Patient denies any issues with their feet. The symptom started onset as an adult hypertension and hyperlipidemia ARE RELATED TO DM   Hypertension Patient is currently on lisinopril hydrochlorothiazide, and their blood pressure today is 133/71. Patient denies any lightheadedness or dizziness. Patient denies headaches, blurred vision, chest pains, shortness of breath, or weakness. Denies any side effects from medication and is content with current medication.   Hyperlipidemia Patient is coming in for recheck of his hyperlipidemia. The patient is currently taking Lipitor. They deny any issues with myalgias or history of liver damage from it. They deny any focal numbness or weakness or chest pain.   Relevant past medical, surgical, family and social history reviewed and updated as indicated. Interim medical history since our last visit reviewed. Allergies and medications reviewed and updated.  Review of Systems  Constitutional:  Negative for chills and fever.  Eyes:  Negative for visual disturbance.  Respiratory:  Negative for shortness of breath and wheezing.   Cardiovascular:  Negative for chest pain and leg swelling.  Musculoskeletal:  Negative for back pain and gait problem.  Skin:  Negative for rash.  Neurological:  Negative for dizziness and light-headedness.  All other systems reviewed and are negative.   Per  HPI unless specifically indicated above   Allergies as of 07/13/2022   No Known Allergies      Medication List        Accurate as of July 13, 2022  2:58 PM. If you have any questions, ask your nurse or doctor.          apixaban 5 MG Tabs tablet Commonly known as: Eliquis TAKE (1) TABLET TWICE A DAY.   atorvastatin 20 MG tablet Commonly known as: LIPITOR Take 1 tablet (20 mg total) by mouth daily.   furosemide 20 MG tablet Commonly known as: LASIX Take 1 tablet (20 mg total) by mouth daily.   GNP PreNatal 28-0.8 MG Tabs TAKE 1 TABLET ONCE DAILY AT NOON   lisinopril-hydrochlorothiazide 20-12.5 MG tablet Commonly known as: ZESTORETIC Take 1 tablet by mouth daily.   pantoprazole 40 MG tablet Commonly known as: PROTONIX Take 1 tablet (40 mg total) by mouth daily.   tamsulosin 0.4 MG Caps capsule Commonly known as: FLOMAX Take 1 capsule (0.4 mg total) by mouth daily after supper.   Vitamin D3 50 MCG (2000 UT) Tabs Take 1 tablet by mouth daily.         Objective:   BP 133/71   Pulse (!) 114   Temp 98.2 F (36.8 C)   Ht _1  (1.702 m)   Wt 172 lb (78 kg)   SpO2 97%   BMI 26.94 kg/m   Wt Readings from Last 3 Encounters:  07/13/22 172 lb (78 kg)  01/09/22 174 lb (78.9 kg)  12/05/21 170 lb (77.1  kg)    Physical Exam Vitals and nursing note reviewed.  Constitutional:      General: He is not in acute distress.    Appearance: He is well-developed. He is not diaphoretic.  Eyes:     General: No scleral icterus.    Conjunctiva/sclera: Conjunctivae normal.  Neck:     Thyroid: No thyromegaly.  Cardiovascular:     Rate and Rhythm: Normal rate and regular rhythm.     Heart sounds: Normal heart sounds. No murmur heard. Pulmonary:     Effort: Pulmonary effort is normal. No respiratory distress.     Breath sounds: Normal breath sounds. No wheezing.  Musculoskeletal:        General: Normal range of motion.     Cervical back: Neck supple.   Lymphadenopathy:     Cervical: No cervical adenopathy.  Skin:    General: Skin is warm and dry.     Findings: No rash.  Neurological:     Mental Status: He is alert and oriented to person, place, and time.     Coordination: Coordination normal.  Psychiatric:        Behavior: Behavior normal.       Assessment & Plan:   Problem List Items Addressed This Visit       Cardiovascular and Mediastinum   Essential hypertension - Primary   Relevant Orders   CBC with Differential/Platelet   CMP14+EGFR   Lipid panel   Bayer DCA Hb A1c Waived     Genitourinary   CKD (chronic kidney disease) stage 3, GFR 30-59 ml/min (HCC)     Other   Prediabetes   Relevant Orders   CBC with Differential/Platelet   CMP14+EGFR   Lipid panel   Bayer DCA Hb A1c Waived   Hyperlipidemia   Relevant Orders   CBC with Differential/Platelet   CMP14+EGFR   Lipid panel   Bayer DCA Hb A1c Waived    A1c looks good at 5.7, continue with diet.  No changes at this point. Blood pressure looks good, will check the rest of his blood work. Follow up plan: Return in about 6 months (around 01/11/2023), or if symptoms worsen or fail to improve, for Prediabetes hypertension and cholesterol.  Counseling provided for all of the vaccine components Orders Placed This Encounter  Procedures   CBC with Differential/Platelet   CMP14+EGFR   Lipid panel   Bayer DCA Hb A1c [place], MD [facility] 07/13/2022, 2:58 PM

## 2022-07-14 LAB — CBC WITH DIFFERENTIAL/PLATELET
Basophils Absolute: 0.1 10*3/uL (ref 0.0–0.2)
Basos: 1 %
EOS (ABSOLUTE): 0.2 10*3/uL (ref 0.0–0.4)
Eos: 2 %
Hematocrit: 39.8 % (ref 37.5–51.0)
Hemoglobin: 13.4 g/dL (ref 13.0–17.7)
Immature Grans (Abs): 0 10*3/uL (ref 0.0–0.1)
Immature Granulocytes: 0 %
Lymphocytes Absolute: 1.9 10*3/uL (ref 0.7–3.1)
Lymphs: 22 %
MCH: 30.5 pg (ref 26.6–33.0)
MCHC: 33.7 g/dL (ref 31.5–35.7)
MCV: 91 fL (ref 79–97)
Monocytes Absolute: 0.4 10*3/uL (ref 0.1–0.9)
Monocytes: 4 %
Neutrophils Absolute: 6.3 10*3/uL (ref 1.4–7.0)
Neutrophils: 71 %
Platelets: 246 10*3/uL (ref 150–450)
RBC: 4.4 x10E6/uL (ref 4.14–5.80)
RDW: 13.9 % (ref 11.6–15.4)
WBC: 8.8 10*3/uL (ref 3.4–10.8)

## 2022-07-14 LAB — CMP14+EGFR
ALT: 12 IU/L (ref 0–44)
AST: 15 IU/L (ref 0–40)
Albumin/Globulin Ratio: 1.9 (ref 1.2–2.2)
Albumin: 4.9 g/dL — ABNORMAL HIGH (ref 3.8–4.8)
Alkaline Phosphatase: 80 IU/L (ref 44–121)
BUN/Creatinine Ratio: 13 (ref 10–24)
BUN: 22 mg/dL (ref 8–27)
Bilirubin Total: 0.5 mg/dL (ref 0.0–1.2)
CO2: 24 mmol/L (ref 20–29)
Calcium: 10.4 mg/dL — ABNORMAL HIGH (ref 8.6–10.2)
Chloride: 101 mmol/L (ref 96–106)
Creatinine, Ser: 1.67 mg/dL — ABNORMAL HIGH (ref 0.76–1.27)
Globulin, Total: 2.6 g/dL (ref 1.5–4.5)
Glucose: 134 mg/dL — ABNORMAL HIGH (ref 70–99)
Potassium: 4.1 mmol/L (ref 3.5–5.2)
Sodium: 142 mmol/L (ref 134–144)
Total Protein: 7.5 g/dL (ref 6.0–8.5)
eGFR: 43 mL/min/{1.73_m2} — ABNORMAL LOW (ref >59)

## 2022-07-14 LAB — LIPID PANEL
Chol/HDL Ratio: 4.5 ratio (ref 0.0–5.0)
Cholesterol, Total: 135 mg/dL (ref 100–199)
HDL: 30 mg/dL — ABNORMAL LOW (ref >39)
LDL Chol Calc (NIH): 83 mg/dL (ref 0–99)
Triglycerides: 118 mg/dL (ref 0–149)
VLDL Cholesterol Cal: 22 mg/dL (ref 5–40)

## 2022-12-22 ENCOUNTER — Telehealth: Payer: Self-pay | Admitting: Family Medicine

## 2022-12-22 NOTE — Telephone Encounter (Signed)
Called patient to schedule Medicare Annual Wellness Visit (AWV). Left message for patient to call back and schedule Medicare Annual Wellness Visit (AWV).  Last date of AWV: 12/05/2021   Please schedule an appointment at any time with either Mickel Baas or [place], NHA's. .  If any questions, please contact me at [phone].  Thank you,  Colletta [place],  [facility] Direct Dial ??[phone]

## 2023-01-11 ENCOUNTER — Ambulatory Visit: Payer: Medicare HMO | Admitting: Family Medicine

## 2023-01-12 ENCOUNTER — Encounter: Payer: Self-pay | Admitting: Family Medicine

## 2023-01-25 ENCOUNTER — Telehealth: Payer: Self-pay | Admitting: Family Medicine

## 2023-01-25 NOTE — Telephone Encounter (Signed)
Called patient to schedule Medicare Annual Wellness Visit (AWV). Left message for patient to call back and schedule Medicare Annual Wellness Visit (AWV).  Last date of AWV: 12/05/2021   Please schedule an appointment at any time with either Laura or Courtney, NHA's. .  If any questions, please contact me at [phone].  Thank you,  Stephanie,  AMB Clinical Support [facility] AWV Program Direct Dial ??[phone]    

## 2023-02-12 ENCOUNTER — Other Ambulatory Visit: Payer: Self-pay | Admitting: Family Medicine

## 2023-02-12 ENCOUNTER — Telehealth: Payer: Self-pay | Admitting: Family Medicine

## 2023-02-12 DIAGNOSIS — I10 Essential (primary) hypertension: Secondary | ICD-10-CM

## 2023-02-12 NOTE — Telephone Encounter (Signed)
Contacted [name] to schedule their annual wellness visit. Appointment made for 02/16/2023.  Thank you,  Judeth Cornfield,  AMB Clinical Support [facility] AWV Program Direct Dial ??[phone]

## 2023-02-16 ENCOUNTER — Ambulatory Visit (INDEPENDENT_AMBULATORY_CARE_PROVIDER_SITE_OTHER): Payer: Medicare HMO

## 2023-02-16 ENCOUNTER — Encounter (HOSPITAL_COMMUNITY): Payer: Self-pay | Admitting: Hematology & Oncology

## 2023-02-16 VITALS — Ht 69.0 in | Wt 170.0 lb

## 2023-02-16 DIAGNOSIS — Z1211 Encounter for screening for malignant neoplasm of colon: Secondary | ICD-10-CM

## 2023-02-16 DIAGNOSIS — Z0000 Encounter for general adult medical examination without abnormal findings: Secondary | ICD-10-CM

## 2023-02-16 NOTE — Patient Instructions (Signed)
Edwin [name] , Thank you for taking time to come for your Medicare Wellness Visit. I appreciate your ongoing commitment to your health goals. Please review the following plan we discussed and let me know if I can assist you in the future.   These are the goals we discussed:  Goals      DIET - INCREASE WATER INTAKE     Exercise 150 min/wk Moderate Activity        This is a list of the screening recommended for you and due dates:  Health Maintenance  Topic Date Due   Pneumonia Vaccine (1 of 2 - PCV) Never done   DTaP/Tdap/Td vaccine (1 - Tdap) Never done   Cologuard (Stool DNA test)  Never done   Zoster (Shingles) Vaccine (1 of 2) Never done   Flu Shot  05/27/2023   Medicare Annual Wellness Visit  02/16/2024   Hepatitis C Screening: USPSTF Recommendation to screen - Ages 18-79 yo.  Completed   HPV Vaccine  Aged Out   COVID-19 Vaccine  Discontinued    Advanced directives: Advance directive discussed with you today. I have provided a copy for you to complete at home and have notarized. Once this is complete please bring a copy in to our office so we can scan it into your chart.   Conditions/risks identified: Aim for 30 minutes of exercise or brisk walking, 6-8 glasses of water, and 5 servings of fruits and vegetables each day.   Next appointment: Follow up in one year for your annual wellness visit.   Preventive Care 68 Years and Older, Male  Preventive care refers to lifestyle choices and visits with your health care provider that can promote health and wellness. What does preventive care include? A yearly physical exam. This is also called an annual well check. Dental exams once or twice a year. Routine eye exams. Ask your health care provider how often you should have your eyes checked. Personal lifestyle choices, including: Daily care of your teeth and gums. Regular physical activity. Eating a healthy diet. Avoiding tobacco and drug use. Limiting alcohol use. Practicing safe  sex. Taking low doses of aspirin every day. Taking vitamin and mineral supplements as recommended by your health care provider. What happens during an annual well check? The services and screenings done by your health care provider during your annual well check will depend on your age, overall health, lifestyle risk factors, and family history of disease. Counseling  Your health care provider may ask you questions about your: Alcohol use. Tobacco use. Drug use. Emotional well-being. Home and relationship well-being. Sexual activity. Eating habits. History of falls. Memory and ability to understand (cognition). Work and work Astronomer. Screening  You may have the following tests or measurements: Height, weight, and BMI. Blood pressure. Lipid and cholesterol levels. These may be checked every 5 years, or more frequently if you are over 2 years old. Skin check. Lung cancer screening. You may have this screening every year starting at age 20 if you have a 30-pack-year history of smoking and currently smoke or have quit within the past 15 years. Fecal occult blood test (FOBT) of the stool. You may have this test every year starting at age 42. Flexible sigmoidoscopy or colonoscopy. You may have a sigmoidoscopy every 5 years or a colonoscopy every 10 years starting at age 58. Prostate cancer screening. Recommendations will vary depending on your family history and other risks. Hepatitis C blood test. Hepatitis B blood test. Sexually transmitted disease (STD)  testing. Diabetes screening. This is done by checking your blood sugar (glucose) after you have not eaten for a while (fasting). You may have this done every 1-3 years. Abdominal aortic aneurysm (AAA) screening. You may need this if you are a current or former smoker. Osteoporosis. You may be screened starting at age 72 if you are at high risk. Talk with your health care provider about your test results, treatment options, and if  necessary, the need for more tests. Vaccines  Your health care provider may recommend certain vaccines, such as: Influenza vaccine. This is recommended every year. Tetanus, diphtheria, and acellular pertussis (Tdap, Td) vaccine. You may need a Td booster every 10 years. Zoster vaccine. You may need this after age 61. Pneumococcal 13-valent conjugate (PCV13) vaccine. One dose is recommended after age 31. Pneumococcal polysaccharide (PPSV23) vaccine. One dose is recommended after age 34. Talk to your health care provider about which screenings and vaccines you need and how often you need them. This information is not intended to replace advice given to you by your health care provider. Make sure you discuss any questions you have with your health care provider. Document Released: 11/08/2015 Document Revised: 07/01/2016 Document Reviewed: 08/13/2015 Elsevier Interactive Patient Education  2017 [place] Prevention in the Home Falls can cause injuries. They can happen to people of all ages. There are many things you can do to make your home safe and to help prevent falls. What can I do on the outside of my home? Regularly fix the edges of walkways and driveways and fix any cracks. Remove anything that might make you trip as you walk through a door, such as a raised step or threshold. Trim any bushes or trees on the path to your home. Use bright outdoor lighting. Clear any walking paths of anything that might make someone trip, such as rocks or tools. Regularly check to see if handrails are loose or broken. Make sure that both sides of any steps have handrails. Any raised decks and porches should have guardrails on the edges. Have any leaves, snow, or ice cleared regularly. Use sand or salt on walking paths during winter. Clean up any spills in your garage right away. This includes oil or grease spills. What can I do in the bathroom? Use night lights. Install grab bars by the toilet  and in the tub and shower. Do not use towel bars as grab bars. Use non-skid mats or decals in the tub or shower. If you need to sit down in the shower, use a plastic, non-slip stool. Keep the floor dry. Clean up any water that spills on the floor as soon as it happens. Remove soap buildup in the tub or shower regularly. Attach bath mats securely with double-sided non-slip rug tape. Do not have throw rugs and other things on the floor that can make you trip. What can I do in the bedroom? Use night lights. Make sure that you have a light by your bed that is easy to reach. Do not use any sheets or blankets that are too big for your bed. They should not hang down onto the floor. Have a firm chair that has side arms. You can use this for support while you get dressed. Do not have throw rugs and other things on the floor that can make you trip. What can I do in the kitchen? Clean up any spills right away. Avoid walking on wet floors. Keep items that you use a lot in  easy-to-reach places. If you need to reach something above you, use a strong step stool that has a grab bar. Keep electrical cords out of the way. Do not use floor polish or wax that makes floors slippery. If you must use wax, use non-skid floor wax. Do not have throw rugs and other things on the floor that can make you trip. What can I do with my stairs? Do not leave any items on the stairs. Make sure that there are handrails on both sides of the stairs and use them. Fix handrails that are broken or loose. Make sure that handrails are as long as the stairways. Check any carpeting to make sure that it is firmly attached to the stairs. Fix any carpet that is loose or worn. Avoid having throw rugs at the top or bottom of the stairs. If you do have throw rugs, attach them to the floor with carpet tape. Make sure that you have a light switch at the top of the stairs and the bottom of the stairs. If you do not have them, ask someone to add  them for you. What else can I do to help prevent falls? Wear shoes that: Do not have high heels. Have rubber bottoms. Are comfortable and fit you well. Are closed at the toe. Do not wear sandals. If you use a stepladder: Make sure that it is fully opened. Do not climb a closed stepladder. Make sure that both sides of the stepladder are locked into place. Ask someone to hold it for you, if possible. Clearly mark and make sure that you can see: Any grab bars or handrails. First and last steps. Where the edge of each step is. Use tools that help you move around (mobility aids) if they are needed. These include: Canes. Walkers. Scooters. Crutches. Turn on the lights when you go into a dark area. Replace any light bulbs as soon as they burn out. Set up your furniture so you have a clear path. Avoid moving your furniture around. If any of your floors are uneven, fix them. If there are any pets around you, be aware of where they are. Review your medicines with your doctor. Some medicines can make you feel dizzy. This can increase your chance of falling. Ask your doctor what other things that you can do to help prevent falls. This information is not intended to replace advice given to you by your health care provider. Make sure you discuss any questions you have with your health care provider. Document Released: 08/08/2009 Document Revised: 03/19/2016 Document Reviewed: 11/16/2014 Elsevier Interactive Patient Education  2017 [employer].

## 2023-02-16 NOTE — Progress Notes (Signed)
Subjective:   Edwin [name] is a 72 y.o. male who presents for Medicare Annual/Subsequent preventive examination. I connected with  Edwin [name] on 02/16/23 by a audio enabled telemedicine application and verified that I am speaking with the correct person using two identifiers.  Patient Location: Home  Provider Location: Home Office  I discussed the limitations of evaluation and management by telemedicine. The patient expressed understanding and agreed to proceed.  Review of Systems     Cardiac Risk Factors include: advanced age (>31men, >43 women);hypertension;male gender;dyslipidemia     Objective:    Today's Vitals   02/16/23 1115  Weight: 170 lb (77.1 kg)  Height: 5\' 9"  (1.753 m)   Body mass index is 25.1 kg/m.     02/16/2023   11:18 AM 12/05/2021    1:41 PM 10/30/2020    9:10 AM 12/02/2017   11:31 AM 07/16/2015   12:38 PM 07/09/2015    3:11 PM 07/03/2015    4:33 PM  Advanced Directives  Does Patient Have a Medical Advance Directive? Yes Yes No No Yes Yes Yes  Type of Estate agent of [place];Living will Healthcare Power of [employer] Power of [employer] Power of [employer] Power of Attorney  Copy of Healthcare Power of Attorney in Chart? No - copy requested No - copy requested    Yes Yes  Would patient like information on creating a medical advance directive?   No - Patient declined No - Patient declined       Current Medications (verified) Outpatient Encounter Medications as of 02/16/2023  Medication Sig   atorvastatin (LIPITOR) 20 MG tablet TAKE 1 TABLET DAILY   Cholecalciferol (VITAMIN D3) 2000 UNITS TABS Take 1 tablet by mouth daily.   ELIQUIS 5 MG TABS tablet TAKE 1 TABLET 2 TIMES A DAY   furosemide (LASIX) 20 MG tablet TAKE 1 TABLET DAILY   lisinopril-hydrochlorothiazide (ZESTORETIC) 20-12.5 MG tablet TAKE 1 TABLET DAILY   pantoprazole (PROTONIX) 40 MG tablet TAKE 1 TABLET DAILY   Prenatal Vit-Fe Fumarate-FA (GNP  PRENATAL) 28-0.8 MG TABS TAKE 1 TABLET ONCE DAILY AT NOON   tamsulosin (FLOMAX) 0.4 MG CAPS capsule TAKE 1 CAPSULE DAILY AFTER SUPPER   No facility-administered encounter medications on file as of 02/16/2023.    Allergies (verified) Patient has no known allergies.   History: Past Medical History:  Diagnosis Date   Acute encephalopathy 08/14/2014   Anemia    nutritional anemia   Anemia    posthemorrhagic anemia   Bladder calculus 10/06/2014   Blood dyscrasia    pancytopenia   BPH (benign prostatic hyperplasia) 01/31/2015   DVT (deep venous thrombosis) [id number]   left leg   Dysphagia 10/11/14   Encephalopathy    Essential hypertension 01/31/2015   Failure to thrive in adult 08/22/14   Hx of sepsis 12/15   Megaloblastic anemia due to B12 deficiency 08/15/2014   Neuromuscular disorder    muscle weakness   Pancytopenia 08/14/2014   Peripheral vascular disease 10/11/14   acute embolism and thrombosis Rt femoral vein   Renal disorder    Bladder stone,Bladder diverticulm   Right leg DVT 08/14/2014   R. femoral vein   S/P IVC filter 10/05/2014   Sec to hematuria on Eliquis   Sepsis 10/05/2014   UTI (lower urinary tract infection)    Past Surgical History:  Procedure Laterality Date   CYSTOSCOPY N/A 08/21/2014   Procedure: CYSTOSCOPY FLEXIBLE;  Surgeon: Chelsea Aus, MD;  Location: [facility];  Service: Urology;  Laterality: N/A;  at bedside   CYSTOSCOPY WITH LITHOLAPAXY N/A 11/22/2014   Procedure: CYSTOSCOPY WITH LITHOLAPAXY;  Surgeon: Chelsea Aus, MD;  Location: [facility];  Service: Urology;  Laterality: N/A;   TRANSURETHRAL INCISION OF PROSTATE N/A 11/22/2014   Procedure: TRANSURETHRAL INCISION OF THE PROSTATE (TUIP);  Surgeon: Chelsea Aus, MD;  Location: [facility];  Service: Urology;  Laterality: N/A;   Family History  Problem Relation Age of Onset   Alzheimer's disease Mother 9   Congenital Murmur Father    Cancer Sister    Social History   Socioeconomic  History   Marital status: Single    Spouse name: Not on file   Number of children: 1   Years of education: 12   Highest education level: High school graduate  Occupational History   Occupation: Retired    Comment: Designer, fashion/clothing  Tobacco Use   Smoking status: Light Smoker    Types: Cigars   Smokeless tobacco: Never  Vaping Use   Vaping Use: Never used  Substance and Sexual Activity   Alcohol use: No    Alcohol/week: 0.0 standard drinks of alcohol   Drug use: No   Sexual activity: Not Currently  Other Topics Concern   Not on file  Social History Narrative   Patient lives alone in a ground floor apartment. He is retired from the [employer]. He has one son.        Social Determinants of Health   Financial Resource Strain: Low Risk  (02/16/2023)   Overall Financial Resource Strain (CARDIA)    Difficulty of Paying Living Expenses: Not hard at all  Food Insecurity: No Food Insecurity (02/16/2023)   Hunger Vital Sign    Worried About Running Out of Food in the Last Year: Never true    Ran Out of Food in the Last Year: Never true  Transportation Needs: No Transportation Needs (02/16/2023)   PRAPARE - Administrator, Civil Service (Medical): No    Lack of Transportation (Non-Medical): No  Physical Activity: Insufficiently Active (02/16/2023)   Exercise Vital Sign    Days of Exercise per Week: 3 days    Minutes of Exercise per Session: 30 min  Stress: No Stress Concern Present (02/16/2023)   [employer] of Occupational Health - Occupational Stress Questionnaire    Feeling of Stress : Not at all  Social Connections: Socially Isolated (02/16/2023)   Social Connection and Isolation Panel [NHANES]    Frequency of Communication with Friends and Family: More than three times a week    Frequency of Social Gatherings with Friends and Family: More than three times a week    Attends Religious Services: Never    Database administrator or Organizations: No    Attends Museum/gallery exhibitions officer: Never    Marital Status: Never married    Tobacco Counseling Ready to quit: Not Answered Counseling given: Not Answered   Clinical Intake:  Pre-visit preparation completed: Yes  Pain : No/denies pain     Nutritional Risks: None Diabetes: No  How often do you need to have someone help you when you read instructions, pamphlets, or other written materials from your doctor or pharmacy?: 1 - Never  Diabetic?no   Interpreter Needed?: No  Information entered by :: Renie Ora, LPN   Activities of Daily Living    02/16/2023   11:18 AM  In your present state of health, do you have any difficulty performing the following activities:  Hearing? 0  Vision? 0  Difficulty concentrating or making decisions? 0  Walking or climbing stairs? 0  Dressing or bathing? 0  Doing errands, shopping? 0  Preparing Food and eating ? N  Using the Toilet? N  In the past six months, have you accidently leaked urine? N  Do you have problems with loss of bowel control? N  Managing your Medications? N  Managing your Finances? N  Housekeeping or managing your Housekeeping? N    Patient Care Team: Dettinger, Elige Radon, MD as PCP - General (Family Medicine) Marcine Matar, MD as Consulting Physician (Urology) Beryle Beams, MD as Consulting Physician (Neurology) Michaelle Copas, MD as Referring Physician (Optometry)  Indicate any recent Medical Services you may have received from other than [facility] providers in the past year (date may be approximate).     Assessment:   This is a routine wellness examination for Edwin [name].  Hearing/Vision screen Vision Screening - Comments:: Wears rx glasses - up to date with routine eye exams with  Dr.Lee   Dietary issues and exercise activities discussed: Current Exercise Habits: Home exercise routine, Type of exercise: walking, Time (Minutes): 30, Frequency (Times/Week): 3, Weekly Exercise (Minutes/Week): 90, Intensity: Mild,  Exercise limited by: None identified   Goals Addressed             This Visit's Progress    DIET - INCREASE WATER INTAKE   On track      Depression Screen    02/16/2023   11:17 AM 07/13/2022    2:25 PM 01/09/2022    2:21 PM 12/05/2021    1:29 PM 06/10/2021    3:49 PM 11/26/2020    9:41 AM 10/30/2020    9:11 AM  PHQ 2/9 Scores  PHQ - 2 Score 0 0 0 0 0 0 0  PHQ- 9 Score     0      Fall Risk    02/16/2023   11:16 AM 07/13/2022    2:25 PM 01/09/2022    2:21 PM 12/05/2021    1:30 PM 06/10/2021    3:48 PM  Fall Risk   Falls in the past year? 0 0 0 0 0  Number falls in past yr: 0   0   Injury with Fall? 0   0   Risk for fall due to : No Fall Risks   No Fall Risks   Follow up Falls prevention discussed   Falls prevention discussed     FALL RISK PREVENTION PERTAINING TO THE HOME:  Any stairs in or around the home? No  If so, are there any without handrails? No  Home free of loose throw rugs in walkways, pet beds, electrical cords, etc? Yes  Adequate lighting in your home to reduce risk of falls? Yes   ASSISTIVE DEVICES UTILIZED TO PREVENT FALLS:  Life alert? No  Use of a cane, walker or w/c? Yes  Grab bars in the bathroom? Yes  Shower chair or bench in shower? Yes  Elevated toilet seat or a handicapped toilet? Yes       12/02/2017   11:36 AM  MMSE - Mini Mental State Exam  Orientation to time 5  Orientation to Place 5  Registration 3  Attention/ Calculation 5  Recall 3  Language- name 2 objects 2  Language- repeat 1  Language- follow 3 step command 2  Language- read & follow direction 1  Write a sentence 1  Copy design 1  Total score 29  02/16/2023   11:19 AM 12/05/2021    1:31 PM 10/30/2020    9:13 AM  6CIT Screen  What Year? 0 points 0 points 0 points  What month? 0 points 0 points 0 points  What time? 0 points 0 points 0 points  Count back from 20 0 points 0 points 0 points  Months in reverse 0 points 0 points 4 points  Repeat phrase 0 points 6  points 0 points  Total Score 0 points 6 points 4 points    Immunizations  There is no immunization history on file for this patient.  TDAP status: Due, Education has been provided regarding the importance of this vaccine. Advised may receive this vaccine at local pharmacy or Health Dept. Aware to provide a copy of the vaccination record if obtained from local pharmacy or Health Dept. Verbalized acceptance and understanding.  Flu Vaccine status: Declined, Education has been provided regarding the importance of this vaccine but patient still declined. Advised may receive this vaccine at local pharmacy or Health Dept. Aware to provide a copy of the vaccination record if obtained from local pharmacy or Health Dept. Verbalized acceptance and understanding.  Pneumococcal vaccine status: Due, Education has been provided regarding the importance of this vaccine. Advised may receive this vaccine at local pharmacy or Health Dept. Aware to provide a copy of the vaccination record if obtained from local pharmacy or Health Dept. Verbalized acceptance and understanding.  Covid-19 vaccine status: Declined, Education has been provided regarding the importance of this vaccine but patient still declined. Advised may receive this vaccine at local pharmacy or Health Dept.or vaccine clinic. Aware to provide a copy of the vaccination record if obtained from local pharmacy or Health Dept. Verbalized acceptance and understanding.  Qualifies for Shingles Vaccine? Yes   Zostavax completed No   Shingrix Completed?: No.    Education has been provided regarding the importance of this vaccine. Patient has been advised to call insurance company to determine out of pocket expense if they have not yet received this vaccine. Advised may also receive vaccine at local pharmacy or Health Dept. Verbalized acceptance and understanding.  Screening Tests Health Maintenance  Topic Date Due   Pneumonia Vaccine 75+ Years old (1 of 2 -  PCV) Never done   DTaP/Tdap/Td (1 - Tdap) Never done   Fecal DNA (Cologuard)  Never done   Zoster Vaccines- Shingrix (1 of 2) Never done   INFLUENZA VACCINE  05/27/2023   Medicare Annual Wellness (AWV)  02/16/2024   Hepatitis C Screening  Completed   HPV VACCINES  Aged Out   COVID-19 Vaccine  Discontinued    Health Maintenance  Health Maintenance Due  Topic Date Due   Pneumonia Vaccine 60+ Years old (1 of 2 - PCV) Never done   DTaP/Tdap/Td (1 - Tdap) Never done   Fecal DNA (Cologuard)  Never done   Zoster Vaccines- Shingrix (1 of 2) Never done    Colorectal cancer screening: Referral to GI placed 02/16/2023. Pt aware the office will call re: appt.  Lung Cancer Screening: (Low Dose CT Chest recommended if Age 43-80 years, 30 pack-year currently smoking OR have quit w/in 15years.) does not qualify.   Lung Cancer Screening Referral: n/a  Additional Screening:  Hepatitis C Screening: does not qualify; Completed 08/15/2014  Vision Screening: Recommended annual ophthalmology exams for early detection of glaucoma and other disorders of the eye. Is the patient up to date with their annual eye exam?  Yes  Who is the  provider or what is the name of the office in which the patient attends annual eye exams? Dr.Lee  If pt is not established with a provider, would they like to be referred to a provider to establish care? No .   Dental Screening: Recommended annual dental exams for proper oral hygiene  Community Resource Referral / Chronic Care Management: CRR required this visit?  No   CCM required this visit?  No      Plan:     I have personally reviewed and noted the following in the patient's chart:   Medical and social history Use of alcohol, tobacco or illicit drugs  Current medications and supplements including opioid prescriptions. Patient is not currently taking opioid prescriptions. Functional ability and status Nutritional status Physical activity Advanced  directives List of other physicians Hospitalizations, surgeries, and ER visits in previous 12 months Vitals Screenings to include cognitive, depression, and falls Referrals and appointments  In addition, I have reviewed and discussed with patient certain preventive protocols, quality metrics, and best practice recommendations. A written personalized care plan for preventive services as well as general preventive health recommendations were provided to patient.     Lorrene Reid, LPN   1/61/0960   Nurse Notes: No Vaccines on File , Patient will discuss Colonoscopy with PCP

## 2023-02-17 ENCOUNTER — Encounter: Payer: Self-pay | Admitting: *Deleted

## 2023-02-19 ENCOUNTER — Encounter: Payer: Self-pay | Admitting: Family Medicine

## 2023-02-19 ENCOUNTER — Ambulatory Visit (INDEPENDENT_AMBULATORY_CARE_PROVIDER_SITE_OTHER): Payer: Medicare HMO | Admitting: Family Medicine

## 2023-02-19 VITALS — BP 114/73 | HR 120 | Temp 98.6°F | Ht 69.0 in | Wt 166.0 lb

## 2023-02-19 DIAGNOSIS — E782 Mixed hyperlipidemia: Secondary | ICD-10-CM

## 2023-02-19 DIAGNOSIS — I10 Essential (primary) hypertension: Secondary | ICD-10-CM | POA: Diagnosis not present

## 2023-02-19 DIAGNOSIS — N1832 Chronic kidney disease, stage 3b: Secondary | ICD-10-CM | POA: Diagnosis not present

## 2023-02-19 DIAGNOSIS — R7303 Prediabetes: Secondary | ICD-10-CM | POA: Diagnosis not present

## 2023-02-19 LAB — BAYER DCA HB A1C WAIVED: HB A1C (BAYER DCA - WAIVED): 5.9 % — ABNORMAL HIGH (ref 4.8–5.6)

## 2023-02-19 NOTE — Progress Notes (Signed)
BP 114/73   Pulse (!) 120   Temp 98.6 F (37 C)   Ht 5\' 9"  (1.753 m)   Wt 166 lb (75.3 kg)   SpO2 99%   BMI 24.51 kg/m    Subjective:   Patient ID: Edwin [name], male    DOB: 1950/11/03, 72 y.o.   MRN: [id number]  HPI: Edwin [name] is a 72 y.o. male presenting on 02/19/2023 for Medical Management of Chronic Issues (6 month)   HPI Prediabetes Patient comes in today for recheck of his diabetes. Patient has been currently taking no medication, diet control. Patient is currently on an ACE inhibitor/ARB. Patient has not seen an ophthalmologist this year. Patient denies any new issues with their feet. The symptom started onset as an adult hypertension and hyperlipidemia ARE RELATED TO DM   Hypertension Patient is currently on lisinopril hydrochlorothiazide and furosemide, and their blood pressure today is 114/73. Patient denies any lightheadedness or dizziness. Patient denies headaches, blurred vision, chest pains, shortness of breath, or weakness. Denies any side effects from medication and is content with current medication.   Hyperlipidemia Patient is coming in for recheck of his hyperlipidemia. The patient is currently taking atorvastatin. They deny any issues with myalgias or history of liver damage from it. They deny any focal numbness or weakness or chest pain.   Recurrent DVTs Patient takes Eliquis for history of recurrent DVTs.  Relevant past medical, surgical, family and social history reviewed and updated as indicated. Interim medical history since our last visit reviewed. Allergies and medications reviewed and updated.  Review of Systems  Constitutional:  Negative for chills and fever.  Respiratory:  Negative for shortness of breath and wheezing.   Cardiovascular:  Negative for chest pain and leg swelling.  Musculoskeletal:  Negative for back pain and gait problem.  Skin:  Negative for rash.  Psychiatric/Behavioral:  Negative for dysphoric mood and suicidal ideas. The  patient is not nervous/anxious.   All other systems reviewed and are negative.   Per HPI unless specifically indicated above   Allergies as of 02/19/2023   No Known Allergies      Medication List        Accurate as of February 19, 2023  3:26 PM. If you have any questions, ask your nurse or doctor.          atorvastatin 20 MG tablet Commonly known as: LIPITOR TAKE 1 TABLET DAILY   Eliquis 5 MG Tabs tablet Generic drug: apixaban TAKE 1 TABLET 2 TIMES A DAY   furosemide 20 MG tablet Commonly known as: LASIX TAKE 1 TABLET DAILY   GNP PreNatal 28-0.8 MG Tabs TAKE 1 TABLET ONCE DAILY AT NOON   lisinopril-hydrochlorothiazide 20-12.5 MG tablet Commonly known as: ZESTORETIC TAKE 1 TABLET DAILY   pantoprazole 40 MG tablet Commonly known as: PROTONIX TAKE 1 TABLET DAILY   tamsulosin 0.4 MG Caps capsule Commonly known as: FLOMAX TAKE 1 CAPSULE DAILY AFTER SUPPER   Vitamin D3 50 MCG (2000 UT) Tabs Take 1 tablet by mouth daily.         Objective:   BP 114/73   Pulse (!) 120   Temp 98.6 F (37 C)   Ht 5\' 9"  (1.753 m)   Wt 166 lb (75.3 kg)   SpO2 99%   BMI 24.51 kg/m   Wt Readings from Last 3 Encounters:  02/19/23 166 lb (75.3 kg)  02/16/23 170 lb (77.1 kg)  07/13/22 172 lb (78 kg)    Physical Exam Vitals  and nursing note reviewed.  Constitutional:      General: He is not in acute distress.    Appearance: He is well-developed. He is not diaphoretic.  Eyes:     General: No scleral icterus.    Conjunctiva/sclera: Conjunctivae normal.  Neck:     Thyroid: No thyromegaly.  Cardiovascular:     Rate and Rhythm: Regular rhythm. Tachycardia present.     Heart sounds: Normal heart sounds. No murmur heard. Pulmonary:     Effort: Pulmonary effort is normal. No respiratory distress.     Breath sounds: Normal breath sounds. No wheezing.  Musculoskeletal:        General: No swelling. Normal range of motion.     Cervical back: Neck supple.  Lymphadenopathy:      Cervical: No cervical adenopathy.  Skin:    General: Skin is warm and dry.     Findings: No rash.  Neurological:     Mental Status: He is alert and oriented to person, place, and time.     Coordination: Coordination normal.  Psychiatric:        Behavior: Behavior normal.     Assessment & Plan:   Problem List Items Addressed This Visit       Cardiovascular and Mediastinum   Essential hypertension - Primary     Genitourinary   CKD (chronic kidney disease) stage 3, GFR 30-59 ml/min (HCC)     Other   Prediabetes   Hyperlipidemia    Patient has slight tachycardia, asymptomatic tachycardia, he does run little higher especially with his smoking.  Will monitor in the future. Follow up plan: Return in about 6 months (around 08/21/2023), or if symptoms worsen or fail to improve, for Prediabetes hypertension and hyperlipidemia.  Counseling provided for all of the vaccine components No orders of the defined types were placed in this encounter.   Arville Care, MD [facility] 02/19/2023, 3:26 PM

## 2023-02-19 NOTE — Addendum Note (Signed)
Addended by: Arville Care on: 02/19/2023 03:31 PM   Modules accepted: Orders

## 2023-02-20 LAB — CMP14+EGFR
ALT: 8 IU/L (ref 0–44)
AST: 18 IU/L (ref 0–40)
Albumin/Globulin Ratio: 1.7 (ref 1.2–2.2)
Albumin: 4.3 g/dL (ref 3.8–4.8)
Alkaline Phosphatase: 83 IU/L (ref 44–121)
BUN/Creatinine Ratio: 13 (ref 10–24)
BUN: 23 mg/dL (ref 8–27)
Bilirubin Total: 0.9 mg/dL (ref 0.0–1.2)
CO2: 23 mmol/L (ref 20–29)
Calcium: 10.1 mg/dL (ref 8.6–10.2)
Chloride: 97 mmol/L (ref 96–106)
Creatinine, Ser: 1.76 mg/dL — ABNORMAL HIGH (ref 0.76–1.27)
Globulin, Total: 2.5 g/dL (ref 1.5–4.5)
Glucose: 160 mg/dL — ABNORMAL HIGH (ref 70–99)
Potassium: 3.9 mmol/L (ref 3.5–5.2)
Sodium: 138 mmol/L (ref 134–144)
Total Protein: 6.8 g/dL (ref 6.0–8.5)
eGFR: 40 mL/min/{1.73_m2} — ABNORMAL LOW (ref >59)

## 2023-02-20 LAB — CBC WITH DIFFERENTIAL/PLATELET
Basophils Absolute: 0.1 10*3/uL (ref 0.0–0.2)
Basos: 1 %
EOS (ABSOLUTE): 0.1 10*3/uL (ref 0.0–0.4)
Eos: 2 %
Hematocrit: 40.1 % (ref 37.5–51.0)
Hemoglobin: 13.1 g/dL (ref 13.0–17.7)
Immature Grans (Abs): 0 10*3/uL (ref 0.0–0.1)
Immature Granulocytes: 0 %
Lymphocytes Absolute: 1.2 10*3/uL (ref 0.7–3.1)
Lymphs: 16 %
MCH: 30.3 pg (ref 26.6–33.0)
MCHC: 32.7 g/dL (ref 31.5–35.7)
MCV: 93 fL (ref 79–97)
Monocytes Absolute: 0.4 10*3/uL (ref 0.1–0.9)
Monocytes: 5 %
Neutrophils Absolute: 6.1 10*3/uL (ref 1.4–7.0)
Neutrophils: 76 %
Platelets: 295 10*3/uL (ref 150–450)
RBC: 4.33 x10E6/uL (ref 4.14–5.80)
RDW: 13.5 % (ref 11.6–15.4)
WBC: 8 10*3/uL (ref 3.4–10.8)

## 2023-02-20 LAB — LIPID PANEL
Chol/HDL Ratio: 5 ratio (ref 0.0–5.0)
Cholesterol, Total: 140 mg/dL (ref 100–199)
HDL: 28 mg/dL — ABNORMAL LOW (ref >39)
LDL Chol Calc (NIH): 83 mg/dL (ref 0–99)
Triglycerides: 167 mg/dL — ABNORMAL HIGH (ref 0–149)
VLDL Cholesterol Cal: 29 mg/dL (ref 5–40)

## 2023-03-15 ENCOUNTER — Encounter: Payer: Self-pay | Admitting: Gastroenterology

## 2023-03-15 ENCOUNTER — Ambulatory Visit: Payer: Medicare HMO | Admitting: Gastroenterology

## 2023-04-01 ENCOUNTER — Encounter (HOSPITAL_COMMUNITY): Payer: Self-pay | Admitting: Hematology & Oncology

## 2023-05-13 ENCOUNTER — Other Ambulatory Visit: Payer: Self-pay | Admitting: Family Medicine

## 2023-05-13 DIAGNOSIS — I10 Essential (primary) hypertension: Secondary | ICD-10-CM

## 2023-08-23 ENCOUNTER — Ambulatory Visit (INDEPENDENT_AMBULATORY_CARE_PROVIDER_SITE_OTHER): Payer: Medicare HMO | Admitting: Family Medicine

## 2023-08-23 ENCOUNTER — Encounter: Payer: Self-pay | Admitting: Family Medicine

## 2023-08-23 VITALS — BP 133/71 | HR 110 | Ht 69.0 in | Wt 160.0 lb

## 2023-08-23 DIAGNOSIS — N183 Chronic kidney disease, stage 3 unspecified: Secondary | ICD-10-CM

## 2023-08-23 DIAGNOSIS — N1832 Chronic kidney disease, stage 3b: Secondary | ICD-10-CM

## 2023-08-23 DIAGNOSIS — I129 Hypertensive chronic kidney disease with stage 1 through stage 4 chronic kidney disease, or unspecified chronic kidney disease: Secondary | ICD-10-CM | POA: Diagnosis not present

## 2023-08-23 DIAGNOSIS — E782 Mixed hyperlipidemia: Secondary | ICD-10-CM | POA: Diagnosis not present

## 2023-08-23 DIAGNOSIS — R7303 Prediabetes: Secondary | ICD-10-CM | POA: Diagnosis not present

## 2023-08-23 DIAGNOSIS — I10 Essential (primary) hypertension: Secondary | ICD-10-CM

## 2023-08-23 LAB — BAYER DCA HB A1C WAIVED: HB A1C (BAYER DCA - WAIVED): 5.3 % (ref 4.8–5.6)

## 2023-08-23 MED ORDER — GNP PRENATAL 28-0.8 MG PO TABS: ORAL_TABLET | ORAL | 3 refills | Status: AC

## 2023-08-23 MED ORDER — APIXABAN 5 MG PO TABS: 5.0000 mg | ORAL_TABLET | Freq: Two times a day (BID) | ORAL | 3 refills | Status: DC

## 2023-08-23 MED ORDER — ATORVASTATIN CALCIUM 20 MG PO TABS: 20.0000 mg | ORAL_TABLET | Freq: Every day | ORAL | 3 refills | Status: DC

## 2023-08-23 MED ORDER — FUROSEMIDE 20 MG PO TABS: 20.0000 mg | ORAL_TABLET | Freq: Every day | ORAL | 3 refills | Status: DC

## 2023-08-23 MED ORDER — LISINOPRIL-HYDROCHLOROTHIAZIDE 20-12.5 MG PO TABS: 1.0000 | ORAL_TABLET | Freq: Every day | ORAL | 3 refills | Status: DC

## 2023-08-23 MED ORDER — TAMSULOSIN HCL 0.4 MG PO CAPS: 0.4000 mg | ORAL_CAPSULE | Freq: Every day | ORAL | 3 refills | Status: DC

## 2023-08-23 MED ORDER — PANTOPRAZOLE SODIUM 40 MG PO TBEC: 40.0000 mg | DELAYED_RELEASE_TABLET | Freq: Every day | ORAL | 3 refills | Status: DC

## 2023-08-23 NOTE — Progress Notes (Signed)
BP 133/71   Pulse (!) 110   Ht 5\' 9"  (1.753 m)   Wt 160 lb (72.6 kg)   SpO2 98%   BMI 23.63 kg/m    Subjective:   Patient ID: Edwin [name], male    DOB: 12-07-1950, 72 y.o.   MRN: [id number]  HPI: Edwin [name] is a 72 y.o. male presenting on 08/23/2023 for Medical Management of Chronic Issues, Hypertension, and Prediabetes   HPI Prediabetes  Patient comes in today for recheck of his diabetes. Patient has been currently taking no medicine, diet control for diabetes. Patient is currently on an ACE inhibitor/ARB. Patient has not seen an ophthalmologist this year. Patient denies any new issues with their feet. The symptom started onset as an adult CKD and hypertension and hyperlipidemia ARE RELATED TO DM   Hypertension Patient is currently on lisinopril hydrochlorothiazide and furosemide, and their blood pressure today is 133/71. Patient denies any lightheadedness or dizziness. Patient denies headaches, blurred vision, chest pains, shortness of breath, or weakness. Denies any side effects from medication and is content with current medication.   Hyperlipidemia Patient is coming in for recheck of his hyperlipidemia. The patient is currently taking atorvastatin. They deny any issues with myalgias or history of liver damage from it. They deny any focal numbness or weakness or chest pain.   Relevant past medical, surgical, family and social history reviewed and updated as indicated. Interim medical history since our last visit reviewed. Allergies and medications reviewed and updated.  Review of Systems  Constitutional:  Negative for chills and fever.  Eyes:  Negative for visual disturbance.  Respiratory:  Negative for shortness of breath and wheezing.   Cardiovascular:  Negative for chest pain and leg swelling.  Musculoskeletal:  Negative for back pain and gait problem.  Skin:  Negative for rash.  All other systems reviewed and are negative.   Per HPI unless specifically indicated  above   Allergies as of 08/23/2023   No Known Allergies      Medication List        Accurate as of August 23, 2023  3:57 PM. If you have any questions, ask your nurse or doctor.          apixaban 5 MG Tabs tablet Commonly known as: Eliquis Take 1 tablet (5 mg total) by mouth 2 (two) times daily.   atorvastatin 20 MG tablet Commonly known as: LIPITOR Take 1 tablet (20 mg total) by mouth daily.   furosemide 20 MG tablet Commonly known as: LASIX Take 1 tablet (20 mg total) by mouth daily.   GNP PreNatal 28-0.8 MG Tabs TAKE 1 TABLET ONCE DAILY AT NOON   lisinopril-hydrochlorothiazide 20-12.5 MG tablet Commonly known as: ZESTORETIC Take 1 tablet by mouth daily.   pantoprazole 40 MG tablet Commonly known as: PROTONIX Take 1 tablet (40 mg total) by mouth daily.   tamsulosin 0.4 MG Caps capsule Commonly known as: FLOMAX Take 1 capsule (0.4 mg total) by mouth daily. What changed: See the new instructions. Changed by: Elige Radon Daeveon Zweber   Vitamin D3 50 MCG (2000 UT) Tabs Take 1 tablet by mouth daily.         Objective:   BP 133/71   Pulse (!) 110   Ht 5\' 9"  (1.753 m)   Wt 160 lb (72.6 kg)   SpO2 98%   BMI 23.63 kg/m   Wt Readings from Last 3 Encounters:  08/23/23 160 lb (72.6 kg)  02/19/23 166 lb (75.3 kg)  02/16/23 170  lb (77.1 kg)    Physical Exam Vitals and nursing note reviewed.  Constitutional:      General: He is not in acute distress.    Appearance: He is well-developed. He is not diaphoretic.  Eyes:     General: No scleral icterus.    Conjunctiva/sclera: Conjunctivae normal.  Neck:     Thyroid: No thyromegaly.  Cardiovascular:     Rate and Rhythm: Normal rate and regular rhythm.     Heart sounds: Normal heart sounds. No murmur heard. Pulmonary:     Effort: Pulmonary effort is normal. No respiratory distress.     Breath sounds: Normal breath sounds. No wheezing.  Musculoskeletal:        General: No swelling. Normal range of motion.      Cervical back: Neck supple.  Lymphadenopathy:     Cervical: No cervical adenopathy.  Skin:    General: Skin is warm and dry.     Findings: No rash.  Neurological:     Mental Status: He is alert and oriented to person, place, and time.     Coordination: Coordination normal.  Psychiatric:        Behavior: Behavior normal.       Assessment & Plan:   Problem List Items Addressed This Visit       Cardiovascular and Mediastinum   Essential hypertension - Primary   Relevant Medications   atorvastatin (LIPITOR) 20 MG tablet   apixaban (ELIQUIS) 5 MG TABS tablet   furosemide (LASIX) 20 MG tablet   lisinopril-hydrochlorothiazide (ZESTORETIC) 20-12.5 MG tablet   Other Relevant Orders   CBC with Differential/Platelet   CMP14+EGFR   Lipid panel   Bayer DCA Hb A1c Waived   PSA, total and free     Genitourinary   CKD (chronic kidney disease) stage 3, GFR 30-59 ml/min (HCC)   Relevant Orders   CBC with Differential/Platelet   CMP14+EGFR   Lipid panel   Bayer DCA Hb A1c Waived   PSA, total and free     Other   Prediabetes   Relevant Orders   CBC with Differential/Platelet   CMP14+EGFR   Lipid panel   Bayer DCA Hb A1c Waived   PSA, total and free   Hyperlipidemia   Relevant Medications   atorvastatin (LIPITOR) 20 MG tablet   apixaban (ELIQUIS) 5 MG TABS tablet   furosemide (LASIX) 20 MG tablet   lisinopril-hydrochlorothiazide (ZESTORETIC) 20-12.5 MG tablet   Other Relevant Orders   CBC with Differential/Platelet   CMP14+EGFR   Lipid panel   Bayer DCA Hb A1c Waived   PSA, total and free    A1c is 5.3 and BP is good.  No changes.  Seems to look good. Follow up plan: Return in about 6 months (around 02/21/2024), or if symptoms worsen or fail to improve, for Prediabetes and hypertension.  Counseling provided for all of the vaccine components Orders Placed This Encounter  Procedures   CBC with Differential/Platelet   CMP14+EGFR   Lipid panel   Bayer DCA Hb A1c  Waived   PSA, total and free    Arville Care, MD [facility] 08/23/2023, 3:57 PM

## 2023-08-24 LAB — LIPID PANEL
Chol/HDL Ratio: 3.9 ratio (ref 0.0–5.0)
Cholesterol, Total: 134 mg/dL (ref 100–199)
HDL: 34 mg/dL — ABNORMAL LOW (ref >39)
LDL Chol Calc (NIH): 78 mg/dL (ref 0–99)
Triglycerides: 119 mg/dL (ref 0–149)
VLDL Cholesterol Cal: 22 mg/dL (ref 5–40)

## 2023-08-24 LAB — CMP14+EGFR
ALT: 8 [IU]/L (ref 0–44)
AST: 16 [IU]/L (ref 0–40)
Albumin: 4.6 g/dL (ref 3.8–4.8)
Alkaline Phosphatase: 92 [IU]/L (ref 44–121)
BUN/Creatinine Ratio: 8 — ABNORMAL LOW (ref 10–24)
BUN: 12 mg/dL (ref 8–27)
Bilirubin Total: 1.1 mg/dL (ref 0.0–1.2)
CO2: 24 mmol/L (ref 20–29)
Calcium: 10.2 mg/dL (ref 8.6–10.2)
Chloride: 100 mmol/L (ref 96–106)
Creatinine, Ser: 1.44 mg/dL — ABNORMAL HIGH (ref 0.76–1.27)
Globulin, Total: 2.8 g/dL (ref 1.5–4.5)
Glucose: 112 mg/dL — ABNORMAL HIGH (ref 70–99)
Potassium: 4.3 mmol/L (ref 3.5–5.2)
Sodium: 140 mmol/L (ref 134–144)
Total Protein: 7.4 g/dL (ref 6.0–8.5)
eGFR: 51 mL/min/{1.73_m2} — ABNORMAL LOW (ref >59)

## 2023-08-24 LAB — CBC WITH DIFFERENTIAL/PLATELET
Basophils Absolute: 0.1 10*3/uL (ref 0.0–0.2)
Basos: 1 %
EOS (ABSOLUTE): 0.2 10*3/uL (ref 0.0–0.4)
Eos: 3 %
Hematocrit: 42.4 % (ref 37.5–51.0)
Hemoglobin: 13.6 g/dL (ref 13.0–17.7)
Immature Grans (Abs): 0 10*3/uL (ref 0.0–0.1)
Immature Granulocytes: 0 %
Lymphocytes Absolute: 1.4 10*3/uL (ref 0.7–3.1)
Lymphs: 21 %
MCH: 30.4 pg (ref 26.6–33.0)
MCHC: 32.1 g/dL (ref 31.5–35.7)
MCV: 95 fL (ref 79–97)
Monocytes Absolute: 0.3 10*3/uL (ref 0.1–0.9)
Monocytes: 5 %
Neutrophils Absolute: 4.5 10*3/uL (ref 1.4–7.0)
Neutrophils: 70 %
Platelets: 288 10*3/uL (ref 150–450)
RBC: 4.47 x10E6/uL (ref 4.14–5.80)
RDW: 13.8 % (ref 11.6–15.4)
WBC: 6.5 10*3/uL (ref 3.4–10.8)

## 2023-08-24 LAB — PSA, TOTAL AND FREE
PSA, Free Pct: 20 %
PSA, Free: 0.24 ng/mL
Prostate Specific Ag, Serum: 1.2 ng/mL (ref 0.0–4.0)

## 2024-02-21 NOTE — Progress Notes (Unsigned)
 Err   This encounter was created in error - please disregard.

## 2024-02-22 ENCOUNTER — Encounter (HOSPITAL_COMMUNITY): Payer: Self-pay | Admitting: Hematology & Oncology

## 2024-02-23 ENCOUNTER — Encounter: Payer: Self-pay | Admitting: Family Medicine

## 2024-02-23 ENCOUNTER — Ambulatory Visit (INDEPENDENT_AMBULATORY_CARE_PROVIDER_SITE_OTHER): Payer: Medicare HMO | Admitting: Family Medicine

## 2024-02-23 VITALS — BP 149/78 | HR 118 | Ht 69.0 in | Wt 165.0 lb

## 2024-02-23 DIAGNOSIS — R000 Tachycardia, unspecified: Secondary | ICD-10-CM | POA: Diagnosis not present

## 2024-02-23 DIAGNOSIS — R7303 Prediabetes: Secondary | ICD-10-CM

## 2024-02-23 DIAGNOSIS — E782 Mixed hyperlipidemia: Secondary | ICD-10-CM | POA: Diagnosis not present

## 2024-02-23 DIAGNOSIS — N1832 Chronic kidney disease, stage 3b: Secondary | ICD-10-CM | POA: Diagnosis not present

## 2024-02-23 DIAGNOSIS — I10 Essential (primary) hypertension: Secondary | ICD-10-CM | POA: Diagnosis not present

## 2024-02-23 LAB — LIPID PANEL

## 2024-02-23 LAB — BAYER DCA HB A1C WAIVED: HB A1C (BAYER DCA - WAIVED): 5.6 % (ref 4.8–5.6)

## 2024-02-23 NOTE — Progress Notes (Signed)
 BP (!) 149/78   Pulse (!) 118   Ht 5\' 9"  (1.753 m)   Wt 165 lb (74.8 kg)   SpO2 97%   BMI 24.37 kg/m    Subjective:   Patient ID: Edwin [name], male    DOB: [birth year], 73 y.o.   MRN: [id number]  HPI: Edwin [name] is a 73 y.o. male presenting on 02/23/2024 for Medical Management of Chronic Issues, Hypertension, and Prediabetes   HPI Prediabetes and CKD Patient comes in today for recheck of his diabetes. Patient has been currently taking no medicine, diet control. Patient is currently on an ACE inhibitor/ARB. Patient has not seen an ophthalmologist this year. Patient denies any new issues with their feet. The symptom started onset as an adult hypertension and hyperlipidemia ARE RELATED TO DM   Hypertension Patient is currently on furosemide  and lisinopril -hydrochlorothiazide , and their blood pressure today is 149/78. Patient denies any lightheadedness or dizziness. Patient denies headaches, blurred vision, chest pains, shortness of breath, or weakness. Denies any side effects from medication and is content with current medication.   Hyperlipidemia Patient is coming in for recheck of his hyperlipidemia. The patient is currently taking atorvastatin . They deny any issues with myalgias or history of liver damage from it. They deny any focal numbness or weakness or chest pain.   Relevant past medical, surgical, family and social history reviewed and updated as indicated. Interim medical history since our last visit reviewed. Allergies and medications reviewed and updated.  Review of Systems  Constitutional:  Negative for chills and fever.  Eyes:  Negative for visual disturbance.  Respiratory:  Negative for shortness of breath and wheezing.   Cardiovascular:  Negative for chest pain and leg swelling.  Musculoskeletal:  Negative for back pain and gait problem.  Skin:  Negative for rash.  Neurological:  Negative for dizziness, light-headedness and headaches.  All other systems reviewed  and are negative.   Per HPI unless specifically indicated above   Allergies as of 02/23/2024   No Known Allergies      Medication List        Accurate as of February 23, 2024  4:28 PM. If you have any questions, ask your nurse or doctor.          apixaban  5 MG Tabs tablet Commonly known as: Eliquis  Take 1 tablet (5 mg total) by mouth 2 (two) times daily.   atorvastatin  20 MG tablet Commonly known as: LIPITOR Take 1 tablet (20 mg total) by mouth daily.   furosemide  20 MG tablet Commonly known as: LASIX  Take 1 tablet (20 mg total) by mouth daily.   GNP PreNatal 28-0.8 MG Tabs TAKE 1 TABLET ONCE DAILY AT NOON   lisinopril -hydrochlorothiazide  20-12.5 MG tablet Commonly known as: ZESTORETIC  Take 1 tablet by mouth daily.   pantoprazole  40 MG tablet Commonly known as: PROTONIX  Take 1 tablet (40 mg total) by mouth daily.   tamsulosin  0.4 MG Caps capsule Commonly known as: FLOMAX  Take 1 capsule (0.4 mg total) by mouth daily.   Vitamin D3 50 MCG (2000 UT) Tabs Take 1 tablet by mouth daily.         Objective:   BP (!) 149/78   Pulse (!) 118   Ht 5\' 9"  (1.753 m)   Wt 165 lb (74.8 kg)   SpO2 97%   BMI 24.37 kg/m   Wt Readings from Last 3 Encounters:  02/23/24 165 lb (74.8 kg)  08/23/23 160 lb (72.6 kg)  02/19/23 166 lb (75.3 kg)  Physical Exam Vitals and nursing note reviewed.  Constitutional:      General: He is not in acute distress.    Appearance: He is well-developed. He is not diaphoretic.  Eyes:     General: No scleral icterus.    Conjunctiva/sclera: Conjunctivae normal.  Neck:     Thyroid : No thyromegaly.  Cardiovascular:     Rate and Rhythm: Regular rhythm. Tachycardia present.     Heart sounds: Normal heart sounds. No murmur heard. Pulmonary:     Effort: Pulmonary effort is normal. No respiratory distress.     Breath sounds: Normal breath sounds. No wheezing.  Musculoskeletal:        General: No swelling. Normal range of motion.      Cervical back: Neck supple.  Lymphadenopathy:     Cervical: No cervical adenopathy.  Skin:    General: Skin is warm and dry.     Findings: No rash.  Neurological:     Mental Status: He is alert and oriented to person, place, and time.     Coordination: Coordination normal.  Psychiatric:        Behavior: Behavior normal.       Assessment & Plan:   Problem List Items Addressed This Visit       Cardiovascular and Mediastinum   Essential hypertension - Primary   Relevant Orders   Bayer DCA Hb A1c Waived   CBC with Differential/Platelet   CMP14+EGFR   Lipid panel     Genitourinary   CKD (chronic kidney disease) stage 3, GFR 30-59 ml/min (HCC)   Relevant Orders   Bayer DCA Hb A1c Waived   CBC with Differential/Platelet   CMP14+EGFR   Lipid panel     Other   Prediabetes   Relevant Orders   Bayer DCA Hb A1c Waived   CBC with Differential/Platelet   CMP14+EGFR   Lipid panel   Hyperlipidemia   Relevant Orders   Bayer DCA Hb A1c Waived   CBC with Differential/Platelet   CMP14+EGFR   Lipid panel   Other Visit Diagnoses       Sinus tachycardia       Relevant Orders   EKG 12-Lead (Completed)       A1c looks good at 5.6.  EKG: Sinus tachycardia, likely from deconditioning Blood pressure slightly elevated, he will check it more frequently at home, he says he was nervous today and had to rush coming in here. Follow up plan: Return in about 6 months (around 08/24/2024), or if symptoms worsen or fail to improve, for Prediabetes and hypertension and hyperlipidemia..  Counseling provided for all of the vaccine components Orders Placed This Encounter  Procedures   Bayer DCA Hb A1c Waived   CBC with Differential/Platelet   CMP14+EGFR   Lipid panel   EKG 12-Lead    Jolyne Needs, MD [facility] 02/23/2024, 4:28 PM

## 2024-02-24 LAB — CBC WITH DIFFERENTIAL/PLATELET
Basophils Absolute: 0.1 10*3/uL (ref 0.0–0.2)
Basos: 1 %
EOS (ABSOLUTE): 0.1 10*3/uL (ref 0.0–0.4)
Eos: 2 %
Hematocrit: 40.7 % (ref 37.5–51.0)
Hemoglobin: 13.5 g/dL (ref 13.0–17.7)
Immature Grans (Abs): 0 10*3/uL (ref 0.0–0.1)
Immature Granulocytes: 0 %
Lymphocytes Absolute: 1.8 10*3/uL (ref 0.7–3.1)
Lymphs: 24 %
MCH: 30.2 pg (ref 26.6–33.0)
MCHC: 33.2 g/dL (ref 31.5–35.7)
MCV: 91 fL (ref 79–97)
Monocytes Absolute: 0.4 10*3/uL (ref 0.1–0.9)
Monocytes: 5 %
Neutrophils Absolute: 5 10*3/uL (ref 1.4–7.0)
Neutrophils: 68 %
Platelets: 265 10*3/uL (ref 150–450)
RBC: 4.47 x10E6/uL (ref 4.14–5.80)
RDW: 13.8 % (ref 11.6–15.4)
WBC: 7.3 10*3/uL (ref 3.4–10.8)

## 2024-02-24 LAB — LIPID PANEL
Cholesterol, Total: 147 mg/dL (ref 100–199)
HDL: 34 mg/dL — ABNORMAL LOW (ref >39)
LDL CALC COMMENT:: 4.3 ratio (ref 0.0–5.0)
LDL Chol Calc (NIH): 94 mg/dL (ref 0–99)
Triglycerides: 99 mg/dL (ref 0–149)
VLDL Cholesterol Cal: 19 mg/dL (ref 5–40)

## 2024-02-24 LAB — CMP14+EGFR
ALT: 17 IU/L (ref 0–44)
AST: 22 IU/L (ref 0–40)
Albumin: 4.7 g/dL (ref 3.8–4.8)
Alkaline Phosphatase: 94 IU/L (ref 44–121)
BUN/Creatinine Ratio: 16 (ref 10–24)
BUN: 21 mg/dL (ref 8–27)
Bilirubin Total: 0.7 mg/dL (ref 0.0–1.2)
CO2: 22 mmol/L (ref 20–29)
Calcium: 10 mg/dL (ref 8.6–10.2)
Chloride: 101 mmol/L (ref 96–106)
Creatinine, Ser: 1.34 mg/dL — ABNORMAL HIGH (ref 0.76–1.27)
Globulin, Total: 2.7 g/dL (ref 1.5–4.5)
Glucose: 137 mg/dL — ABNORMAL HIGH (ref 70–99)
Potassium: 3.9 mmol/L (ref 3.5–5.2)
Sodium: 140 mmol/L (ref 134–144)
Total Protein: 7.4 g/dL (ref 6.0–8.5)
eGFR: 56 mL/min/{1.73_m2} — ABNORMAL LOW (ref >59)

## 2024-03-01 ENCOUNTER — Telehealth: Payer: Self-pay

## 2024-03-09 NOTE — Telephone Encounter (Signed)
 fyi

## 2024-07-04 ENCOUNTER — Ambulatory Visit

## 2024-08-10 NOTE — Progress Notes (Signed)
 Edwin [name]                                          MRN: [id number]   08/10/2024   The [facility] Quality Team Specialist reviewed this patient medical record for the purposes of chart review for care gap closure. The following were reviewed: chart review for care gap closure-controlling blood pressure.    [facility] Quality Team

## 2024-08-14 ENCOUNTER — Encounter: Payer: Self-pay | Admitting: *Deleted

## 2024-08-24 ENCOUNTER — Ambulatory Visit: Admitting: Family Medicine

## 2024-08-31 ENCOUNTER — Ambulatory Visit (INDEPENDENT_AMBULATORY_CARE_PROVIDER_SITE_OTHER): Admitting: Family Medicine

## 2024-08-31 ENCOUNTER — Encounter: Payer: Self-pay | Admitting: Family Medicine

## 2024-08-31 VITALS — BP 127/84 | HR 128 | Ht 69.0 in | Wt 159.0 lb

## 2024-08-31 DIAGNOSIS — N1832 Chronic kidney disease, stage 3b: Secondary | ICD-10-CM

## 2024-08-31 DIAGNOSIS — I10 Essential (primary) hypertension: Secondary | ICD-10-CM

## 2024-08-31 DIAGNOSIS — R7303 Prediabetes: Secondary | ICD-10-CM | POA: Diagnosis not present

## 2024-08-31 DIAGNOSIS — E782 Mixed hyperlipidemia: Secondary | ICD-10-CM | POA: Diagnosis not present

## 2024-08-31 DIAGNOSIS — I82409 Acute embolism and thrombosis of unspecified deep veins of unspecified lower extremity: Secondary | ICD-10-CM

## 2024-08-31 LAB — LIPID PANEL

## 2024-08-31 LAB — BAYER DCA HB A1C WAIVED: HB A1C (BAYER DCA - WAIVED): 5.7 % — ABNORMAL HIGH (ref 4.8–5.6)

## 2024-08-31 MED ORDER — TAMSULOSIN HCL 0.4 MG PO CAPS: 0.4000 mg | ORAL_CAPSULE | Freq: Every day | ORAL | 3 refills | Status: AC

## 2024-08-31 MED ORDER — APIXABAN 5 MG PO TABS: 5.0000 mg | ORAL_TABLET | Freq: Two times a day (BID) | ORAL | 3 refills | Status: AC

## 2024-08-31 MED ORDER — LISINOPRIL-HYDROCHLOROTHIAZIDE 20-12.5 MG PO TABS: 1.0000 | ORAL_TABLET | Freq: Every day | ORAL | 3 refills | Status: AC

## 2024-08-31 MED ORDER — ATORVASTATIN CALCIUM 20 MG PO TABS: 20.0000 mg | ORAL_TABLET | Freq: Every day | ORAL | 3 refills | Status: AC

## 2024-08-31 MED ORDER — FUROSEMIDE 20 MG PO TABS
20.0000 mg | ORAL_TABLET | Freq: Every day | ORAL | 3 refills | Status: AC
Start: 2024-08-31 — End: ?

## 2024-08-31 MED ORDER — PANTOPRAZOLE SODIUM 40 MG PO TBEC: 40.0000 mg | DELAYED_RELEASE_TABLET | Freq: Every day | ORAL | 3 refills | Status: AC

## 2024-08-31 NOTE — Progress Notes (Signed)
 BP 127/84   Pulse (!) 128   Ht 5' 9 (1.753 m)   Wt 159 lb (72.1 kg)   BMI 23.48 kg/m    Subjective:   Patient ID: Edwin [name], male    DOB: Mar 12, 1951, 73 y.o.   MRN: [id number]  HPI: Edwin [name] is a 73 y.o. male presenting on 08/31/2024 for Medical Management of Chronic Issues, Chronic Kidney Disease, and Hypertension   Discussed the use of AI scribe software for clinical note transcription with the patient, who gave verbal consent to proceed.  History of Present Illness   [name] is a 73 year old male with hypertension and a history of blood clots who presents for a recheck of his conditions.  Hypertension and edema - Blood pressure is well-controlled with daily lisinopril . - Furosemide  taken daily for peripheral swelling, which improves urination.  Hyperlipidemia - Atorvastatin  continued for cholesterol management.  Anticoagulation therapy - Blood thinner continued for history of blood clots. - No bleeding complications, including absence of blood in stool.  Lower urinary tract symptoms - Flomax  used for urinary flow related to prostate issues. - Urine flow is effectively improved with current regimen.  Tobacco use - Continues to smoke, with intermittent periods of abstinence and efforts to reduce smoking.          Relevant past medical, surgical, family and social history reviewed and updated as indicated. Interim medical history since our last visit reviewed. Allergies and medications reviewed and updated.  Review of Systems  Constitutional:  Negative for chills and fever.  Eyes:  Negative for visual disturbance.  Respiratory:  Negative for shortness of breath and wheezing.   Cardiovascular:  Negative for chest pain and leg swelling.  Musculoskeletal:  Negative for back pain and gait problem.  Skin:  Negative for rash.  Neurological:  Negative for dizziness and light-headedness.  All other systems reviewed and are negative.   Per HPI unless  specifically indicated above   Allergies as of 08/31/2024   No Known Allergies      Medication List        Accurate as of August 31, 2024  4:22 PM. If you have any questions, ask your nurse or doctor.          apixaban  5 MG Tabs tablet Commonly known as: Eliquis  Take 1 tablet (5 mg total) by mouth 2 (two) times daily.   atorvastatin  20 MG tablet Commonly known as: LIPITOR Take 1 tablet (20 mg total) by mouth daily.   furosemide  20 MG tablet Commonly known as: LASIX  Take 1 tablet (20 mg total) by mouth daily.   GNP PreNatal 28-0.8 MG Tabs TAKE 1 TABLET ONCE DAILY AT NOON   lisinopril -hydrochlorothiazide  20-12.5 MG tablet Commonly known as: ZESTORETIC  Take 1 tablet by mouth daily.   pantoprazole  40 MG tablet Commonly known as: PROTONIX  Take 1 tablet (40 mg total) by mouth daily.   tamsulosin  0.4 MG Caps capsule Commonly known as: FLOMAX  Take 1 capsule (0.4 mg total) by mouth daily.   Vitamin D3 50 MCG (2000 UT) Tabs Take 1 tablet by mouth daily.         Objective:   BP 127/84   Pulse (!) 128   Ht 5' 9 (1.753 m)   Wt 159 lb (72.1 kg)   BMI 23.48 kg/m   Wt Readings from Last 3 Encounters:  08/31/24 159 lb (72.1 kg)  02/23/24 165 lb (74.8 kg)  08/23/23 160 lb (72.6 kg)    Physical Exam Physical  Exam   NECK: Thyroid  normal, no masses. CHEST: Lungs clear to auscultation bilaterally. CARDIOVASCULAR: Regular rate and rhythm, no murmurs. EXTREMITIES: No edema in extremities.         Assessment & Plan:   Problem List Items Addressed This Visit       Cardiovascular and Mediastinum   Essential hypertension   Relevant Medications   apixaban  (ELIQUIS ) 5 MG TABS tablet   atorvastatin  (LIPITOR) 20 MG tablet   furosemide  (LASIX ) 20 MG tablet   lisinopril -hydrochlorothiazide  (ZESTORETIC ) 20-12.5 MG tablet   Recurrent deep vein thrombosis (DVT) (HCC)   Relevant Medications   apixaban  (ELIQUIS ) 5 MG TABS tablet   atorvastatin  (LIPITOR) 20 MG  tablet   furosemide  (LASIX ) 20 MG tablet   lisinopril -hydrochlorothiazide  (ZESTORETIC ) 20-12.5 MG tablet     Genitourinary   CKD (chronic kidney disease) stage 3, GFR 30-59 ml/min (HCC) - Primary   Relevant Orders   CBC with Differential/Platelet   CMP14+EGFR   Lipid panel     Other   Prediabetes   Relevant Orders   Bayer DCA Hb A1c Waived   Hyperlipidemia   Relevant Medications   apixaban  (ELIQUIS ) 5 MG TABS tablet   atorvastatin  (LIPITOR) 20 MG tablet   furosemide  (LASIX ) 20 MG tablet   lisinopril -hydrochlorothiazide  (ZESTORETIC ) 20-12.5 MG tablet        Essential hypertension Blood pressure well-controlled with current medication. - Continue lisinopril  as prescribed.  Chronic kidney disease, stage 3b No acute issues.  Mixed hyperlipidemia Cholesterol management with atorvastatin  continues. - Continue atorvastatin  as prescribed.  History of deep vein thrombosis of lower extremity No signs of bleeding or complications. Anticoagulation therapy continues. - Continue current anticoagulation therapy.  Benign prostatic hyperplasia with lower urinary tract symptoms Good urinary flow with Flomax . - Continue Flomax  as prescribed.          Follow up plan: Return in about 3 months (around 12/01/2024), or if symptoms worsen or fail to improve, for Prediabetes and CKD and hypertension.  Counseling provided for all of the vaccine components Orders Placed This Encounter  Procedures   CBC with Differential/Platelet   CMP14+EGFR   Lipid panel   Bayer DCA Hb A1c Waived    Fonda Levins, MD [facility] 08/31/2024, 4:22 PM

## 2024-09-01 ENCOUNTER — Ambulatory Visit: Payer: Self-pay

## 2024-09-01 LAB — CBC WITH DIFFERENTIAL/PLATELET
Basophils Absolute: 0 x10E3/uL (ref 0.0–0.2)
Basos: 1 %
EOS (ABSOLUTE): 0.1 x10E3/uL (ref 0.0–0.4)
Eos: 1 %
Hematocrit: 40.6 % (ref 37.5–51.0)
Hemoglobin: 13.8 g/dL (ref 13.0–17.7)
Immature Grans (Abs): 0 x10E3/uL (ref 0.0–0.1)
Immature Granulocytes: 0 %
Lymphocytes Absolute: 1.5 x10E3/uL (ref 0.7–3.1)
Lymphs: 17 %
MCH: 34.2 pg — ABNORMAL HIGH (ref 26.6–33.0)
MCHC: 34 g/dL (ref 31.5–35.7)
MCV: 101 fL — ABNORMAL HIGH (ref 79–97)
Monocytes Absolute: 0.3 x10E3/uL (ref 0.1–0.9)
Monocytes: 4 %
Neutrophils Absolute: 6.8 x10E3/uL (ref 1.4–7.0)
Neutrophils: 77 %
Platelets: 276 x10E3/uL (ref 150–450)
RBC: 4.04 x10E6/uL — ABNORMAL LOW (ref 4.14–5.80)
RDW: 14.1 % (ref 11.6–15.4)
WBC: 8.8 x10E3/uL (ref 3.4–10.8)

## 2024-09-01 LAB — CMP14+EGFR
ALT: 10 IU/L (ref 0–44)
AST: 22 IU/L (ref 0–40)
Albumin: 4.7 g/dL (ref 3.8–4.8)
Alkaline Phosphatase: 86 IU/L (ref 47–123)
BUN/Creatinine Ratio: 15 (ref 10–24)
BUN: 20 mg/dL (ref 8–27)
Bilirubin Total: 0.8 mg/dL (ref 0.0–1.2)
CO2: 25 mmol/L (ref 20–29)
Calcium: 10 mg/dL (ref 8.6–10.2)
Chloride: 98 mmol/L (ref 96–106)
Creatinine, Ser: 1.37 mg/dL — AB (ref 0.76–1.27)
Globulin, Total: 2.8 g/dL (ref 1.5–4.5)
Glucose: 118 mg/dL — AB (ref 70–99)
Potassium: 4.4 mmol/L (ref 3.5–5.2)
Sodium: 140 mmol/L (ref 134–144)
Total Protein: 7.5 g/dL (ref 6.0–8.5)
eGFR: 54 mL/min/1.73 — AB (ref >59)

## 2024-09-01 LAB — LIPID PANEL
Cholesterol, Total: 130 mg/dL (ref 100–199)
HDL: 36 mg/dL — AB (ref >39)
LDL CALC COMMENT:: 3.6 ratio (ref 0.0–5.0)
LDL Chol Calc (NIH): 74 mg/dL (ref 0–99)
Triglycerides: 105 mg/dL (ref 0–149)
VLDL Cholesterol Cal: 20 mg/dL (ref 5–40)

## 2024-09-07 ENCOUNTER — Ambulatory Visit: Payer: Self-pay | Admitting: Family Medicine

## 2024-11-15 NOTE — Progress Notes (Signed)
 Edwin [name]                                          MRN: [id number]   11/15/2024   The [facility] Quality Team Specialist reviewed this patient medical record for the purposes of chart review for care gap closure. The following were reviewed: chart review for care gap closure-colorectal cancer screening.    [facility] Quality Team

## 2024-11-29 ENCOUNTER — Encounter: Payer: Self-pay | Admitting: *Deleted

## 2024-11-29 NOTE — Progress Notes (Signed)
 Abron Neddo                                          MRN: [id number]   11/29/2024   The [facility] Quality Team Specialist reviewed this patient medical record for the purposes of chart review for care gap closure. The following were reviewed: abstraction for care gap closure-controlling blood pressure.    [facility] Quality Team

## 2024-12-01 ENCOUNTER — Ambulatory Visit: Admitting: Family Medicine

## 2024-12-07 ENCOUNTER — Ambulatory Visit
